# Patient Record
Sex: Male | Born: 1945 | Race: White | Hispanic: No | Marital: Married | Smoking: Former smoker
Health system: Southern US, Community
[De-identification: ages and names within clinical notes are randomized; demographics above are authoritative.]

## PROBLEM LIST (undated history)

## (undated) DIAGNOSIS — E7800 Pure hypercholesterolemia, unspecified: Secondary | ICD-10-CM

## (undated) DIAGNOSIS — I739 Peripheral vascular disease, unspecified: Secondary | ICD-10-CM

## (undated) DIAGNOSIS — C801 Malignant (primary) neoplasm, unspecified: Secondary | ICD-10-CM

## (undated) DIAGNOSIS — K630 Abscess of intestine: Secondary | ICD-10-CM

## (undated) DIAGNOSIS — K219 Gastro-esophageal reflux disease without esophagitis: Secondary | ICD-10-CM

## (undated) DIAGNOSIS — I10 Essential (primary) hypertension: Secondary | ICD-10-CM

## (undated) DIAGNOSIS — E119 Type 2 diabetes mellitus without complications: Secondary | ICD-10-CM

## (undated) DIAGNOSIS — K279 Peptic ulcer, site unspecified, unspecified as acute or chronic, without hemorrhage or perforation: Secondary | ICD-10-CM

## (undated) HISTORY — PX: CHOLECYSTECTOMY: SHX55

## (undated) HISTORY — PX: HERNIA REPAIR: SHX51

## (undated) HISTORY — PX: TONSILLECTOMY: SUR1361

## (undated) HISTORY — PX: OTHER SURGICAL HISTORY: SHX169

## (undated) HISTORY — PX: RETINAL DETACHMENT SURGERY: SHX105

---

## 2012-05-02 ENCOUNTER — Ambulatory Visit: Payer: Self-pay | Admitting: Vascular Surgery

## 2012-05-02 LAB — BASIC METABOLIC PANEL
Anion Gap: 6 — ABNORMAL LOW (ref 7–16)
BUN: 12 mg/dL (ref 7–18)
Calcium, Total: 9.8 mg/dL (ref 8.5–10.1)
Chloride: 107 mmol/L (ref 98–107)
Co2: 29 mmol/L (ref 21–32)
Creatinine: 0.88 mg/dL (ref 0.60–1.30)
EGFR (African American): >60
EGFR (Non-African Amer.): >60
Glucose: 137 mg/dL — ABNORMAL HIGH (ref 65–99)
Osmolality: 285 (ref 275–301)
Potassium: 3.9 mmol/L (ref 3.5–5.1)
Sodium: 142 mmol/L (ref 136–145)

## 2012-05-02 IMAGING — XA IR VASCULAR PROCEDURE
13 of 16 series · 15 of 24 positions shown · IV contrast (IODINE)
Comparison: none

[Series 1: care aorta · 1 of 2 slices shown (1 of 2)]
[im 1/2]
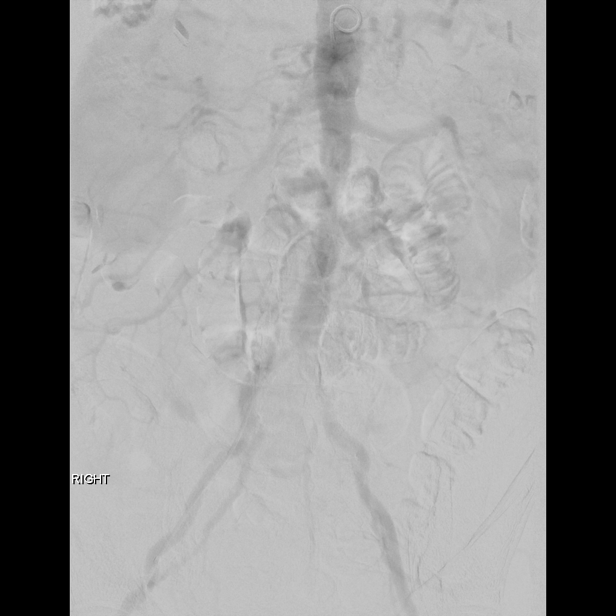

[Series 2: care aorta · 1 of 4 slices shown (2 of 2)]
[im 4/4]
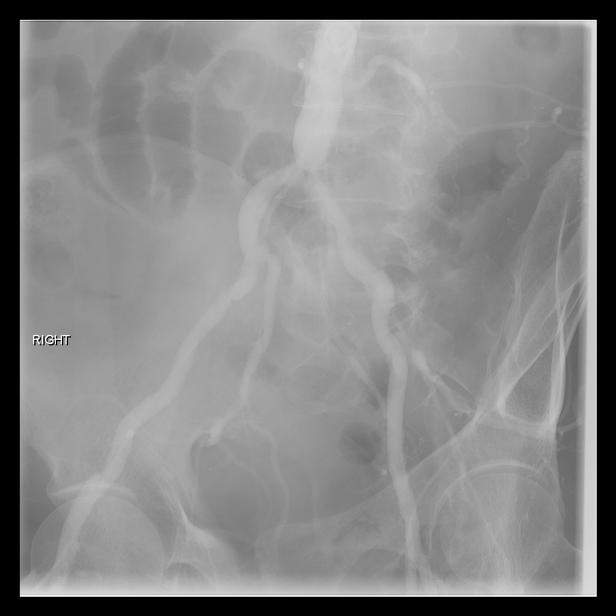

[Series 4: care sfa · 1 of 2 slices shown (1 of 2)]
[im 1/2]
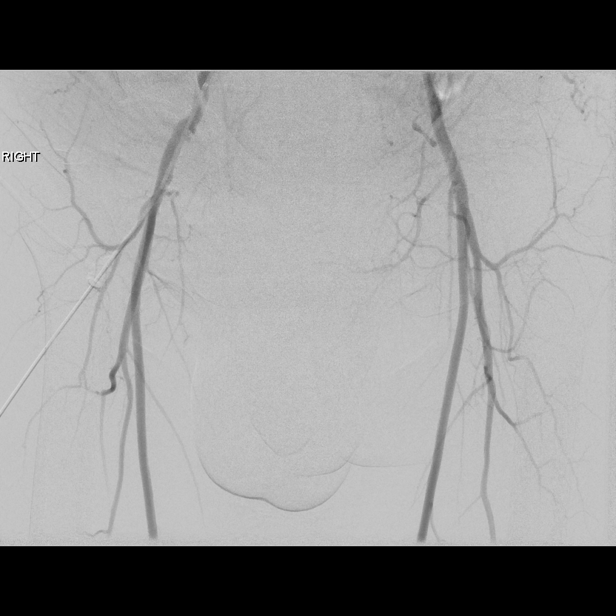

[Series 5: care sfa · 1 of 2 slices shown (2 of 2)]
[im 1/2]
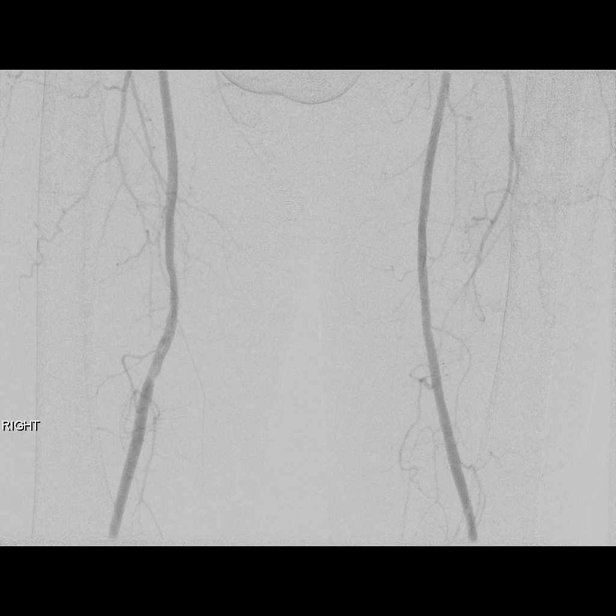

[Series 7: care iliacs · 1 of 2 slices shown (1 of 8)]
[im 1/2]
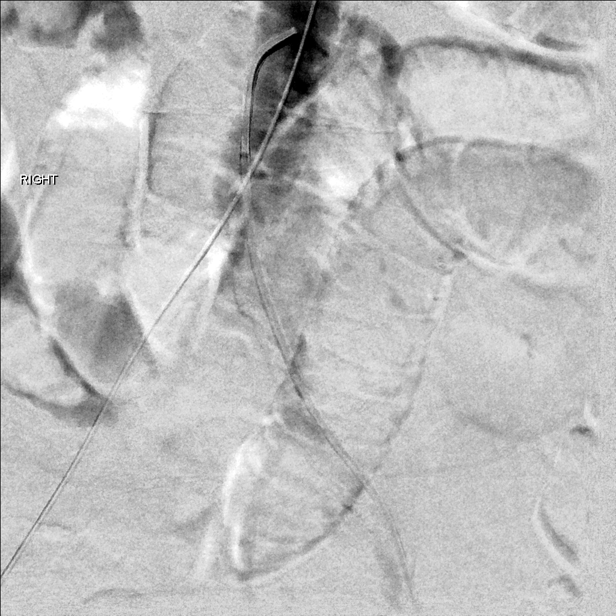

[Series 8: care iliacs · 1 of 2 slices shown (2 of 8)]
[im 1/2]
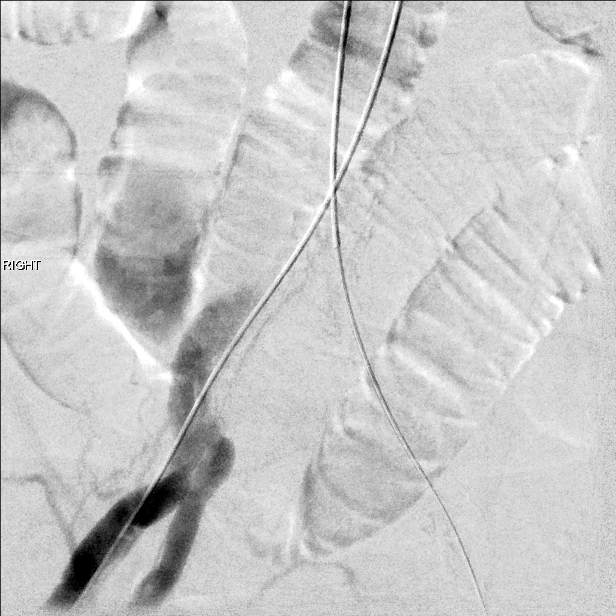

[Series 9: care iliacs · 1 of 2 slices shown (3 of 8)]
[im 1/2]
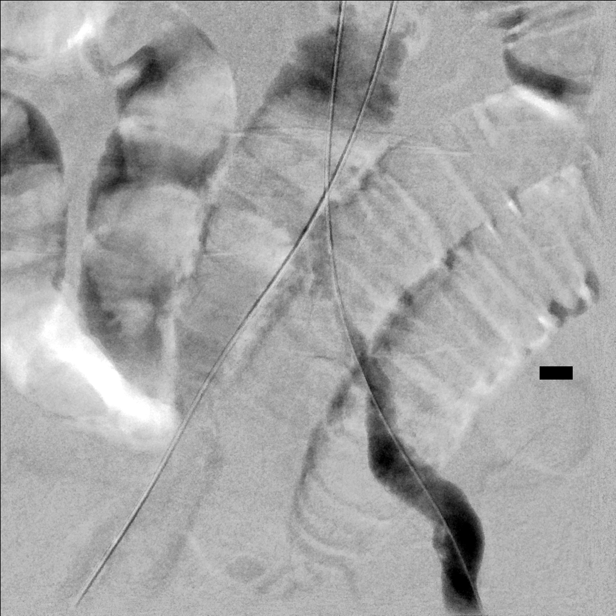

[Series 10: fl - angio · 1 of 1 slices shown]
[im 1/1]
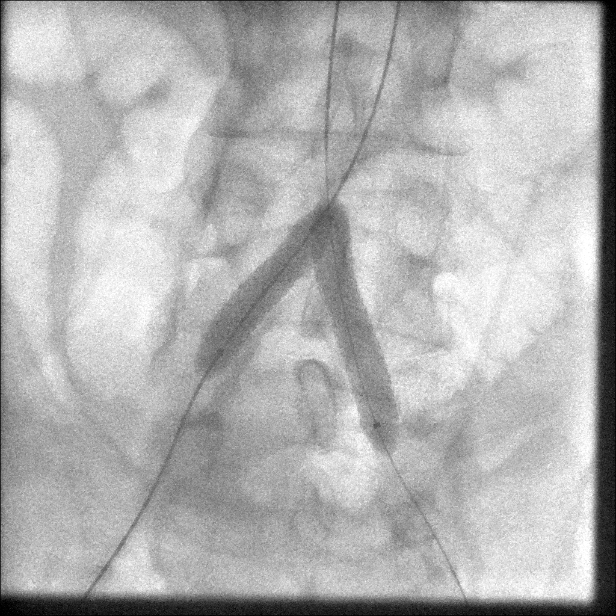

[Series 11: care iliacs · 1 of 2 slices shown (4 of 8)]
[im 1/2]
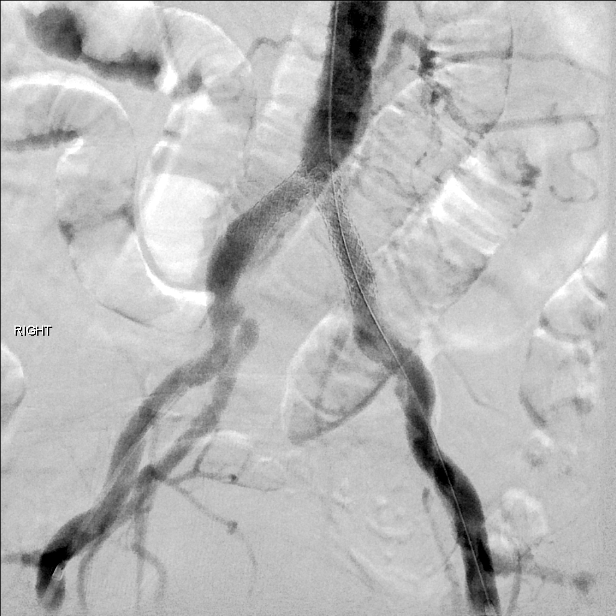

[Series 12: care iliacs · 2 of 2 slices shown (5 of 8)]
[im 1/2]
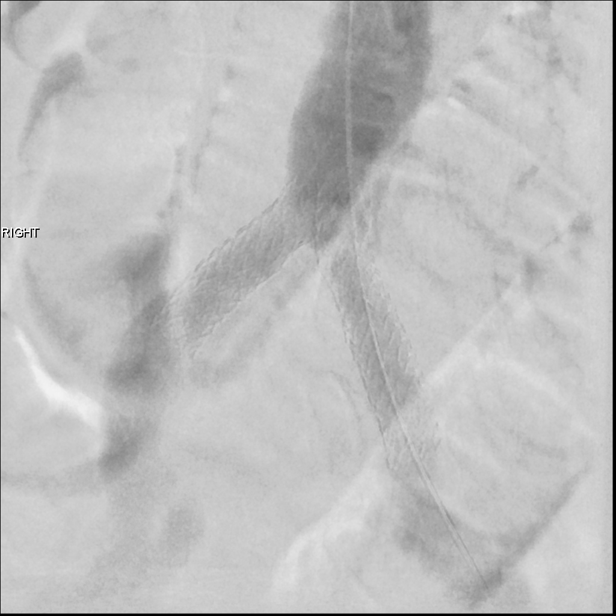
[im 2/2]
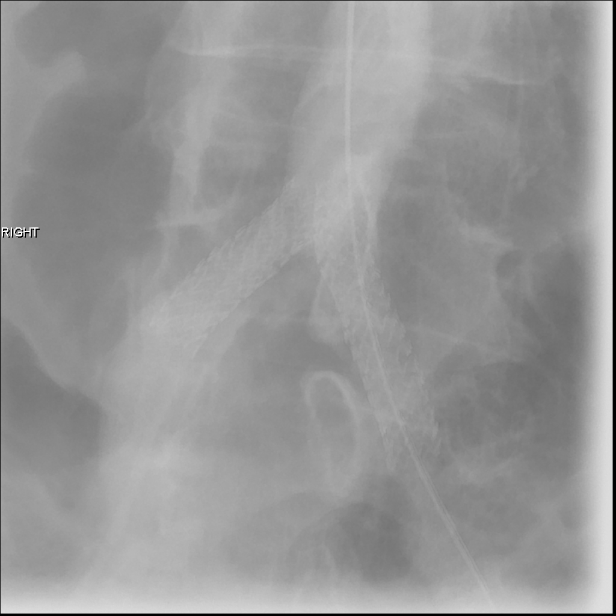

[Series 14: care iliacs · 1 of 2 slices shown (6 of 8)]
[im 1/2]
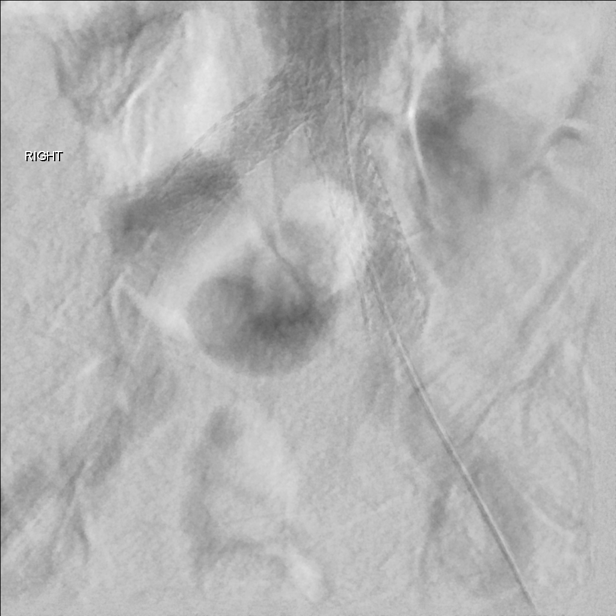

[Series 15: care iliacs · 2 of 2 slices shown (7 of 8)]
[im 1/2]
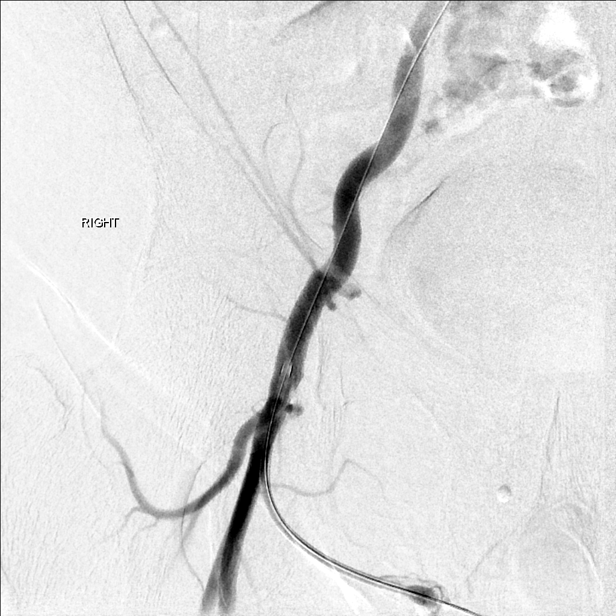
[im 2/2]
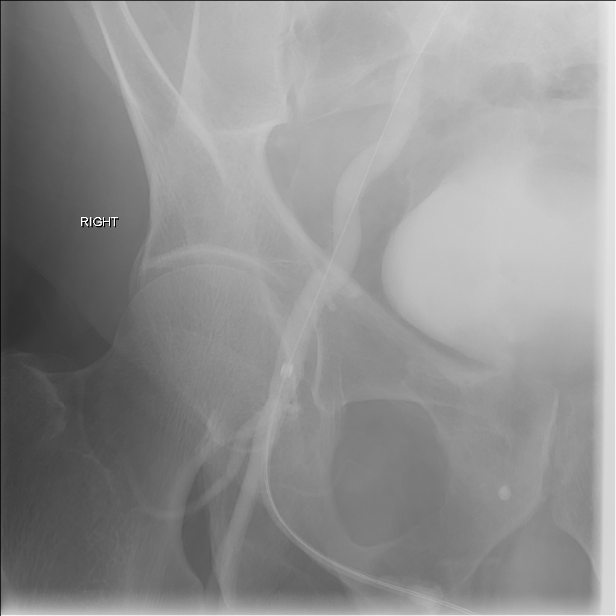

[Series 16: care iliacs · 1 of 2 slices shown (8 of 8)]
[im 2/2]
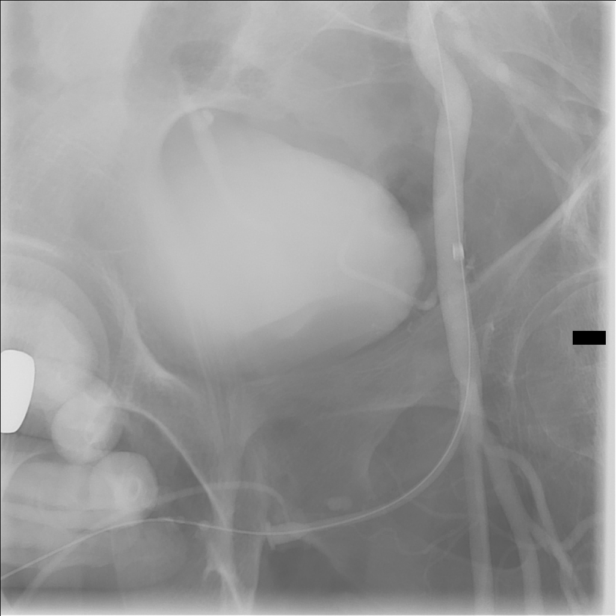

[15 of 24 positions shown; findings below may reference images not displayed]

IMAGES IMPORTED FROM THE [employer] WORKFLOW SYSTEM
NO DICTATION FOR STUDY

## 2014-04-22 ENCOUNTER — Ambulatory Visit: Payer: Self-pay | Admitting: Family Medicine

## 2014-05-02 ENCOUNTER — Ambulatory Visit: Payer: Self-pay | Admitting: Family Medicine

## 2014-06-02 ENCOUNTER — Ambulatory Visit: Payer: Self-pay | Admitting: Family Medicine

## 2014-11-19 NOTE — Op Note (Signed)
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   PROCEDURES PERFORMED:  1. Abdominal aortogram.  2. Bilateral lower extremity distal runoff.  3. Introduction catheter into aorta, left groin.  4. Introduction catheter into aorta, right groin.  5. Percutaneous transluminal angioplasty, left common iliac to 8 mm.  6. Percutaneous transluminal angioplasty, right common iliac to 8 mm.   SURGEON: Hortencia Pilar, MD  SEDATION: Versed 5 mg plus fentanyl 150 mcg administered IV. Continuous ECG, pulse oximetry and cardiopulmonary monitoring was performed throughout the entire procedure by the interventional radiology nurse. Total sedation time was one hour.   ACCESSES:  1. 6 French sheath, right common femoral artery. 2. 6 French sheath, left common femoral artery.   CONTRAST USED: Isovue 105 mL.   FLUOROSCOPY TIME: 4.6 minutes.   INDICATIONS: [name] is a 69 year old gentleman who was having increasing pain in his lower extremities as well as profoundly short distance claudication which is having severe consequences for his daily activities. The risks and benefits for angiography and intervention were reviewed, all questions are answered, and the patient agrees to proceed.   DESCRIPTION OF PROCEDURE: The patient is taken to special procedures and placed in the supine position. After adequate sedation is achieved, both groins are prepped and draped in sterile fashion. Ultrasound is placed in a sterile sleeve. Ultrasound is utilized secondary to lack of appropriate landmarks and to avoid vascular injury. Common femoral artery is identified, bifurcation is noted, and  the artery is followed more proximally. Under direct ultrasound visualization, after 1% lidocaine has been infiltrated in the soft tissues, the micropuncture needle is inserted into the anterior wall of the common femoral artery, microwire followed by microsheath, J-wire followed by 5 French sheath and 5 French pigtail catheter. Pigtail catheter is advanced into the abdominal aorta and positioned at approximately the T12 level. AP projection of the aorta is obtained, bilateral oblique views of the pelvis are obtained, and subsequently AP projection of the femoral arteries down to the popliteal. After review of the images, 4000 units of heparin was given and the ultrasound was once again utilized. It is in a sterile sleeve. The left common femoral artery is identified, femoral bifurcation is noted, and then the artery is scanned more proximally. 1% lidocaine is infiltrated in the soft tissues and access to the common femoral artery is obtained with a micropuncture needle, microwire followed by microsheath, J-wire followed by 6 French sheath. The 5 French sheath on the right is exchanged for a 6 French sheath over a Magic torque wire, introduced through the pigtail catheter. Using the Magic torque wire and a KMP catheter, the iliac stenosis is negotiated, KMP is advanced into the aorta, hand injection of contrast is utilized to demonstrate intraluminal positioning, and the Magic torque wire reintroduced.   After measuring the oblique image, an 8 x 29 Herculink stent is selected for the right and an 8 x 39 Herculink stent is selected for the left. They are then advanced and brought up into the distal aorta and deployed simultaneously to 11 atmospheres. Follow-up angiography demonstrates excellent positioning of the stents with complete resolution of the stenosis. A small area of irregularity distal to the left stent is ballooned with the 8  x 39 Herculink balloon. Followup oblique view after this maneuver again  confirms excellent stent position and wide patency bilaterally with rapid flow of contrast now from the aorta into the iliac systems bilaterally. Excellent femoral pulses are now noted bilaterally.   Oblique views of the right and left groin are obtained and a StarClose device is deployed without difficulty. There are no immediate complications.   INTERPRETATION: The abdominal aorta is opacified with a bolus injection of contrast. It demonstrates diffuse disease but there are no hemodynamically significant stenoses. At the origins of the common iliacs bilaterally, there are greater than 90% stenoses noted. On the right is a very short segment, perhaps 10 mm in length, and on the left it appears to be a much longer segment involved, approximately 30 to 35 mm. External iliac arteries appear widely patent bilaterally as do the common femorals.   The right common femoral, profunda femoris, and superficial femoral artery as well as the popliteal in its visualized segments all are widely patent with non-hemodynamically significant disease noted, particularly at Hunter's canal, with similar findings on the left.   Following angioplasty and stent placement, the distal aorta and common iliac arteries are now widely patent with complete resolution of the stenosis.   SUMMARY: Successful recanalization of bilateral iliac arteries, as described above. ____________________________ Katha Cabal, MD ggs:slb D: 05/02/2012 10:52:15 ET T: 05/02/2012 11:11:38 ET JOB#: [id number]  cc: Katha Cabal, MD, <Dictator> Irven Easterly. Kary Kos, [place] MD ELECTRONICALLY SIGNED 05/03/2012 13:26

## 2015-01-28 ENCOUNTER — Encounter: Payer: Self-pay | Admitting: Dietician

## 2016-07-10 ENCOUNTER — Emergency Department
Admission: EM | Admit: 2016-07-10 | Discharge: 2016-07-10 | Disposition: A | Discharge status: Home / Self Care | Payer: Medicare Other | Attending: Emergency Medicine | Admitting: Emergency Medicine

## 2016-07-10 ENCOUNTER — Emergency Department: Payer: Medicare Other

## 2016-07-10 ENCOUNTER — Encounter: Payer: Self-pay | Admitting: Emergency Medicine

## 2016-07-10 DIAGNOSIS — E119 Type 2 diabetes mellitus without complications: Secondary | ICD-10-CM | POA: Insufficient documentation

## 2016-07-10 DIAGNOSIS — I10 Essential (primary) hypertension: Secondary | ICD-10-CM | POA: Diagnosis not present

## 2016-07-10 DIAGNOSIS — Z7984 Long term (current) use of oral hypoglycemic drugs: Secondary | ICD-10-CM | POA: Insufficient documentation

## 2016-07-10 DIAGNOSIS — Y929 Unspecified place or not applicable: Secondary | ICD-10-CM | POA: Insufficient documentation

## 2016-07-10 DIAGNOSIS — Y939 Activity, unspecified: Secondary | ICD-10-CM | POA: Insufficient documentation

## 2016-07-10 DIAGNOSIS — X58XXXA Exposure to other specified factors, initial encounter: Secondary | ICD-10-CM | POA: Insufficient documentation

## 2016-07-10 DIAGNOSIS — Z87891 Personal history of nicotine dependence: Secondary | ICD-10-CM | POA: Insufficient documentation

## 2016-07-10 DIAGNOSIS — Y999 Unspecified external cause status: Secondary | ICD-10-CM | POA: Insufficient documentation

## 2016-07-10 DIAGNOSIS — T184XXA Foreign body in colon, initial encounter: Secondary | ICD-10-CM | POA: Diagnosis not present

## 2016-07-10 DIAGNOSIS — K630 Abscess of intestine: Secondary | ICD-10-CM | POA: Diagnosis not present

## 2016-07-10 DIAGNOSIS — Z79899 Other long term (current) drug therapy: Secondary | ICD-10-CM | POA: Insufficient documentation

## 2016-07-10 DIAGNOSIS — R109 Unspecified abdominal pain: Secondary | ICD-10-CM | POA: Diagnosis present

## 2016-07-10 HISTORY — DX: Type 2 diabetes mellitus without complications: E11.9

## 2016-07-10 HISTORY — DX: Essential (primary) hypertension: I10

## 2016-07-10 LAB — CBC WITH DIFFERENTIAL/PLATELET
Basophils Absolute: 0.1 10*3/uL (ref 0–0.1)
Basophils Relative: 1 %
Eosinophils Absolute: 0.4 10*3/uL (ref 0–0.7)
Eosinophils Relative: 4 %
HCT: 43.4 % (ref 40.0–52.0)
Hemoglobin: 14.6 g/dL (ref 13.0–18.0)
Lymphocytes Relative: 18 %
Lymphs Abs: 1.8 10*3/uL (ref 1.0–3.6)
MCH: 31.4 pg (ref 26.0–34.0)
MCHC: 33.5 g/dL (ref 32.0–36.0)
MCV: 93.5 fL (ref 80.0–100.0)
Monocytes Absolute: 1 10*3/uL (ref 0.2–1.0)
Monocytes Relative: 10 %
Neutro Abs: 6.7 10*3/uL — ABNORMAL HIGH (ref 1.4–6.5)
Neutrophils Relative %: 67 %
Platelets: 258 10*3/uL (ref 150–440)
RBC: 4.64 MIL/uL (ref 4.40–5.90)
RDW: 13.4 % (ref 11.5–14.5)
WBC: 10.2 10*3/uL (ref 3.8–10.6)

## 2016-07-10 LAB — COMPREHENSIVE METABOLIC PANEL
ALT: 14 U/L — ABNORMAL LOW (ref 17–63)
AST: 18 U/L (ref 15–41)
Albumin: 3.8 g/dL (ref 3.5–5.0)
Alkaline Phosphatase: 47 U/L (ref 38–126)
Anion gap: 6 (ref 5–15)
BUN: 13 mg/dL (ref 6–20)
CO2: 26 mmol/L (ref 22–32)
Calcium: 10.1 mg/dL (ref 8.9–10.3)
Chloride: 106 mmol/L (ref 101–111)
Creatinine, Ser: 1.03 mg/dL (ref 0.61–1.24)
GFR calc Af Amer: >60 mL/min (ref >60)
GFR calc non Af Amer: >60 mL/min (ref >60)
Glucose, Bld: 142 mg/dL — ABNORMAL HIGH (ref 65–99)
Potassium: 3.7 mmol/L (ref 3.5–5.1)
Sodium: 138 mmol/L (ref 135–145)
Total Bilirubin: 0.7 mg/dL (ref 0.3–1.2)
Total Protein: 7.1 g/dL (ref 6.5–8.1)

## 2016-07-10 LAB — LIPASE, BLOOD: Lipase: 25 U/L (ref 11–51)

## 2016-07-10 IMAGING — DX DG ABDOMEN 2V
2 series · 2 of 2 positions shown · non-contrast
Comparison: None available

CLINICAL DATA: Lower abdominal pain and discomfort bloating.

EXAM:
ABDOMEN - 2 VIEW

[abdomen erect]
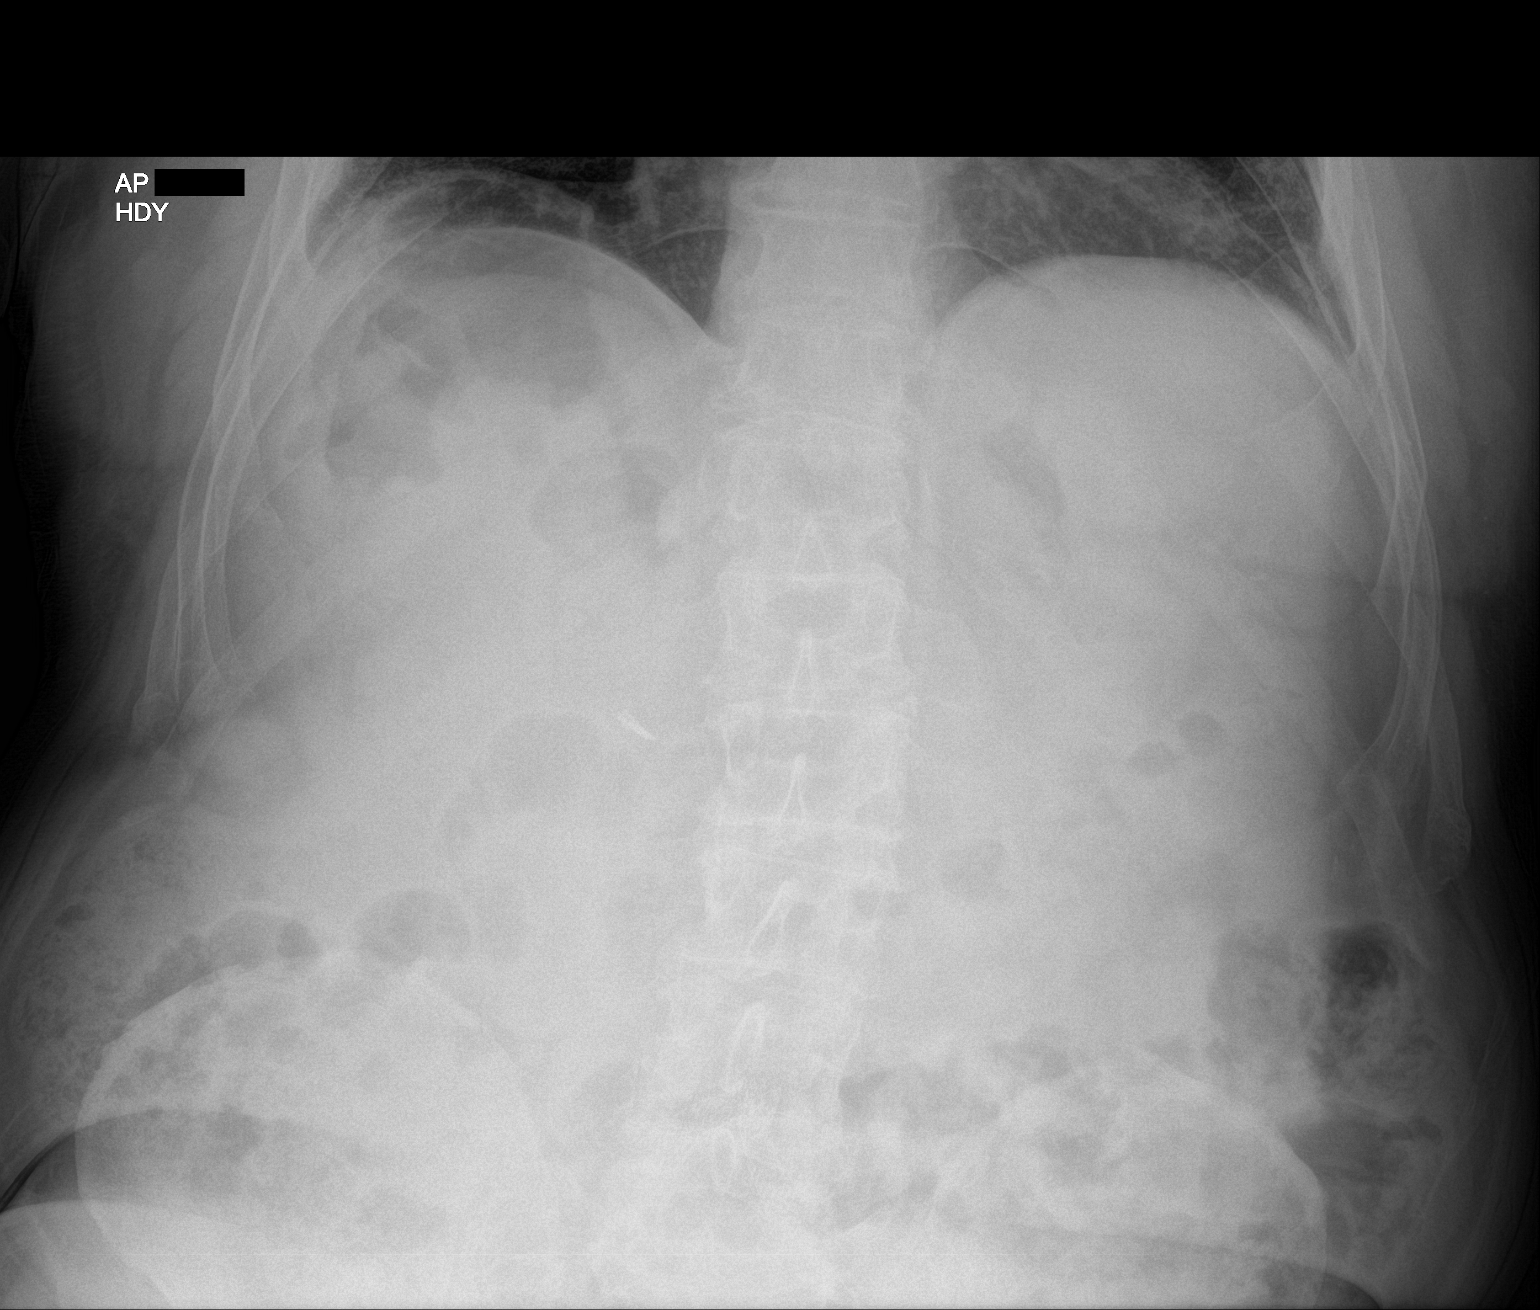

[abdomen supine]
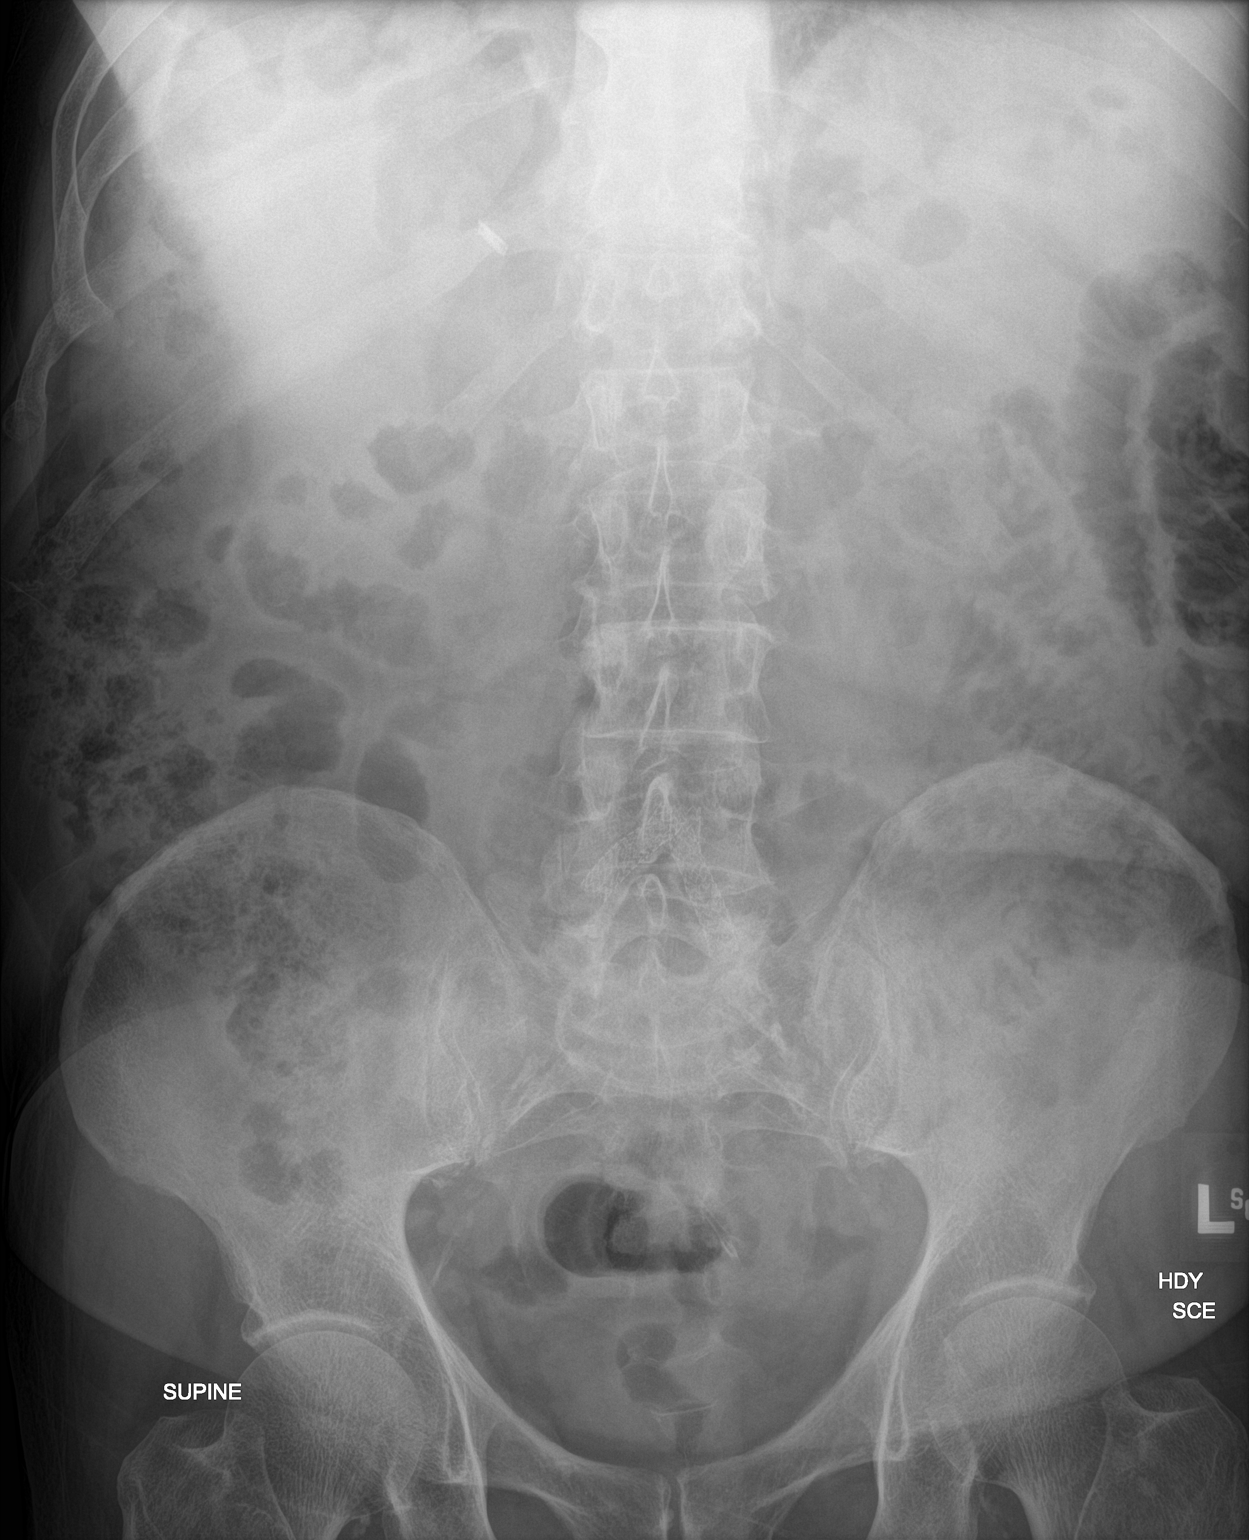

[2 of 2 positions shown; findings below may reference images not displayed]

FINDINGS: Scattered air throughout small and large bowel. Stool throughout the
colon. No significant obstruction pattern, ileus or dilatation.
Aortoiliac atherosclerosis noted with iliac bifurcation stents
present. Remote cholecystectomy.

Mild distention of the hepatic flexure of the colon beneath the
right hemidiaphragm. Also, lucency beneath the right hemidiaphragm
superimposed over the gas pattern noted, difficult to exclude free
air by plain radiography. Basilar atelectasis noted.
IMPRESSION: Negative for bowel obstruction, ileus or significant dilatation.

Lucency beneath the right hemidiaphragm with superimposed bowel gas,
difficult to exclude free air.

Remote postoperative changes as above

These results were called by telephone at the time of interpretation
on [DATE] at [DATE] to Dr. MV , who verbally
acknowledged these results.

## 2016-07-10 IMAGING — CT CT ABD-PELV W/ CM
2 of 5 series · 15 of 46 positions shown, 17 images · IV contrast (APPLIED)
Comparison: None.

CLINICAL DATA: Constipation with abdominal pain for 4 days.

EXAM:
CT ABDOMEN AND PELVIS WITH CONTRAST
TECHNIQUE: Multidetector CT imaging of the abdomen and pelvis was performed
using the standard protocol following bolus administration of
intravenous contrast.
CONTRAST:  100mL [SB] IOPAMIDOL ([SB]) INJECTION 61%

[Series 2: routine abd/pel with · axial · 0.84mm/px · z∈[-883,-478]mm · 12 of 93 slices shown, 14 images]
[im 6/93  soft-tissue]
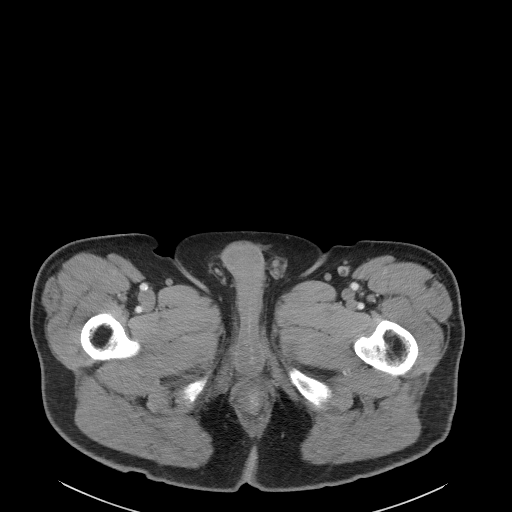
[im 6/93  bone]
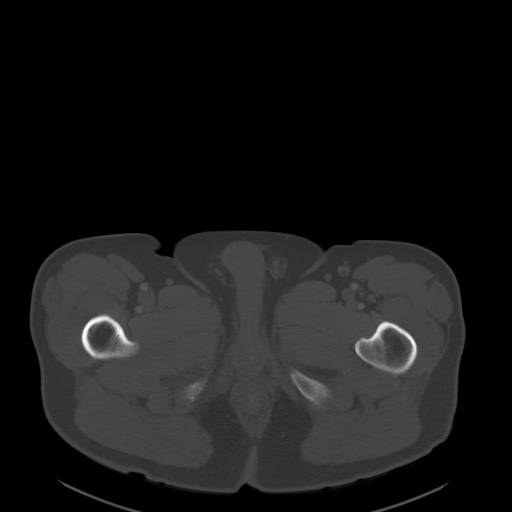
[im 16/93  soft-tissue]
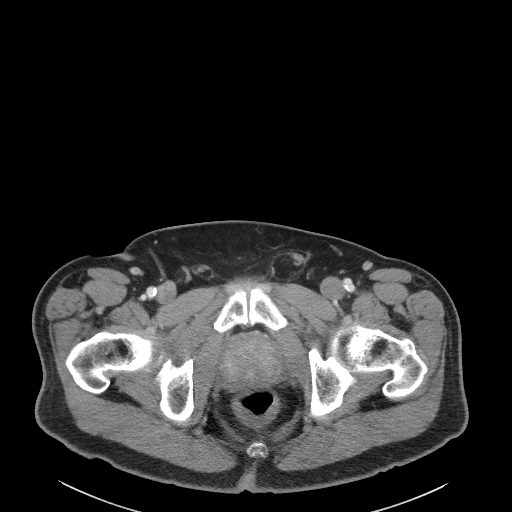
[im 21/93  soft-tissue]
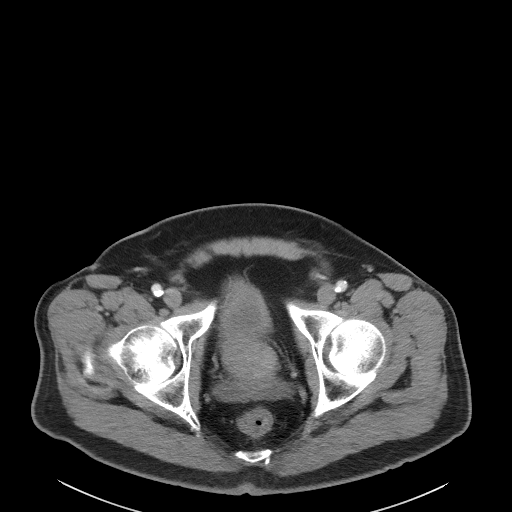
[im 26/93  soft-tissue]
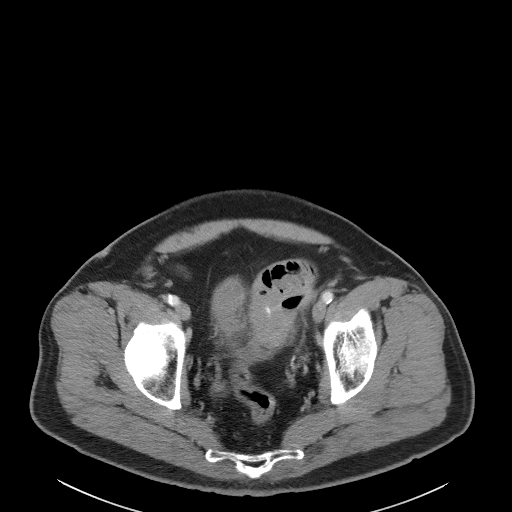
[im 36/93  soft-tissue]
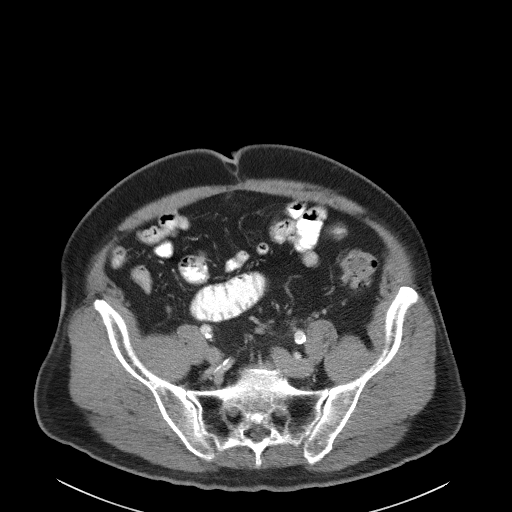
[im 41/93  soft-tissue]
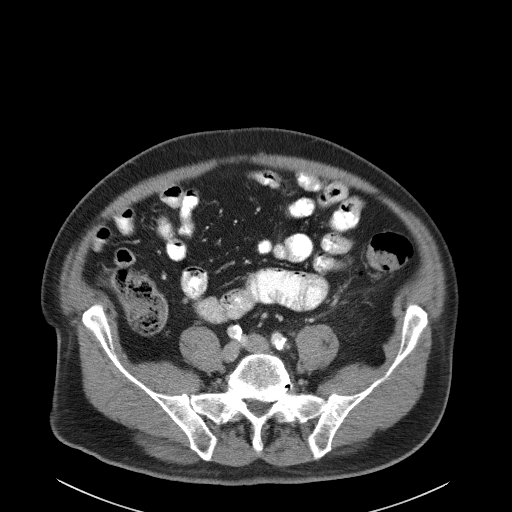
[im 52/93  soft-tissue]
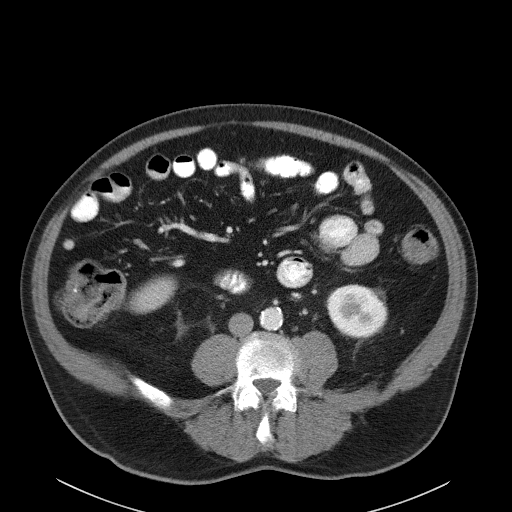
[im 57/93  soft-tissue]
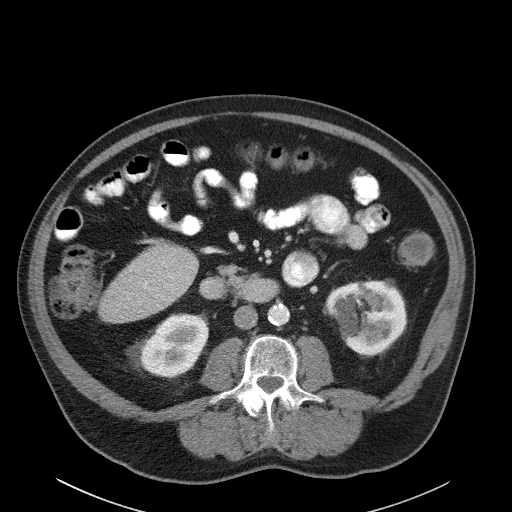
[im 67/93  soft-tissue]
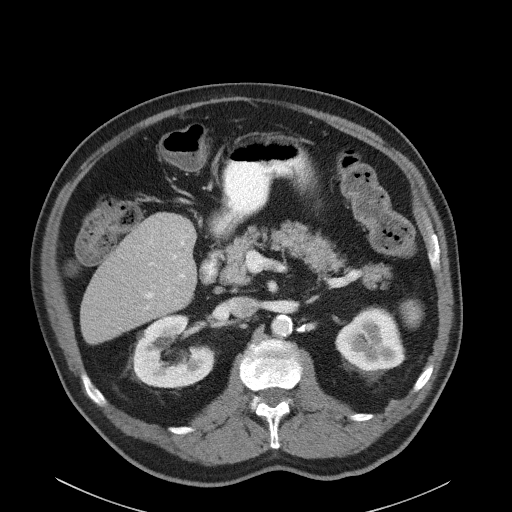
[im 67/93  bone]
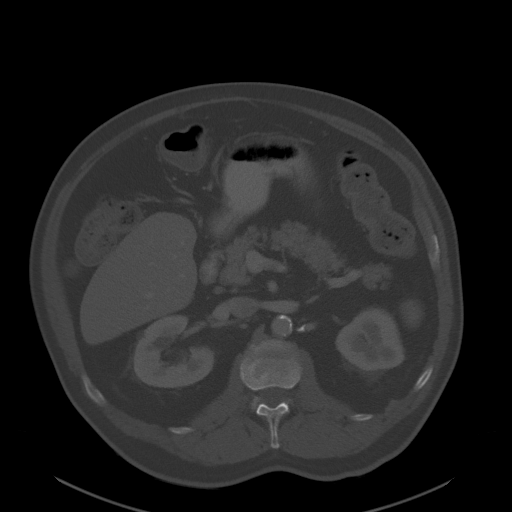
[im 72/93  soft-tissue]
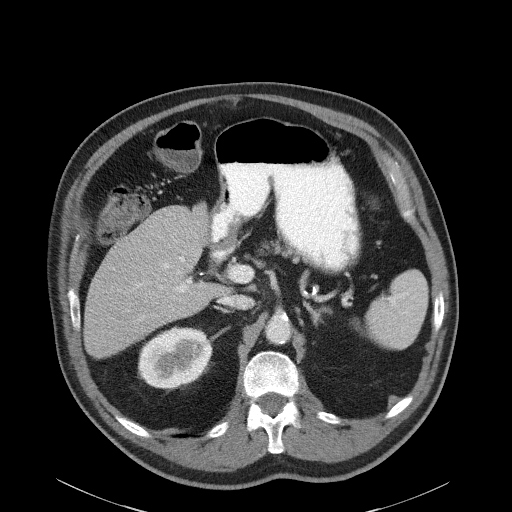
[im 77/93  soft-tissue]
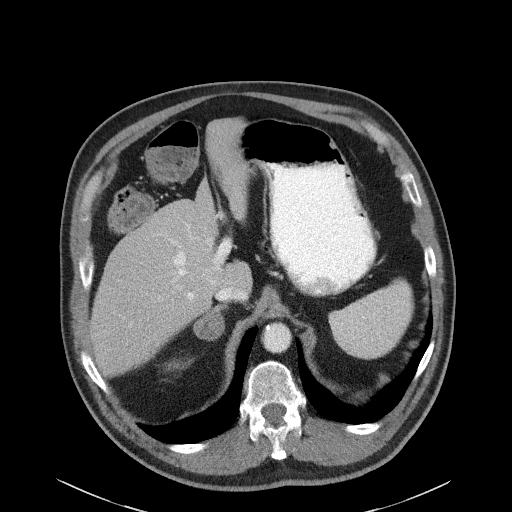
[im 87/93  soft-tissue]
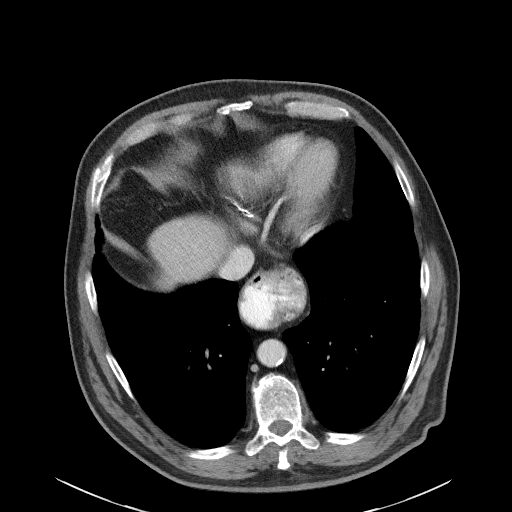

[Series 5: coronal st · coronal · 0.75mm/px · 3 of 114 slices shown]
[im 38/114  soft-tissue]
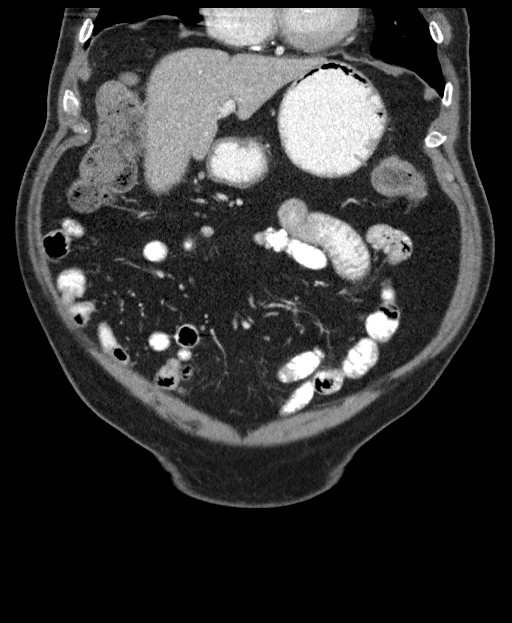
[im 51/114  soft-tissue]
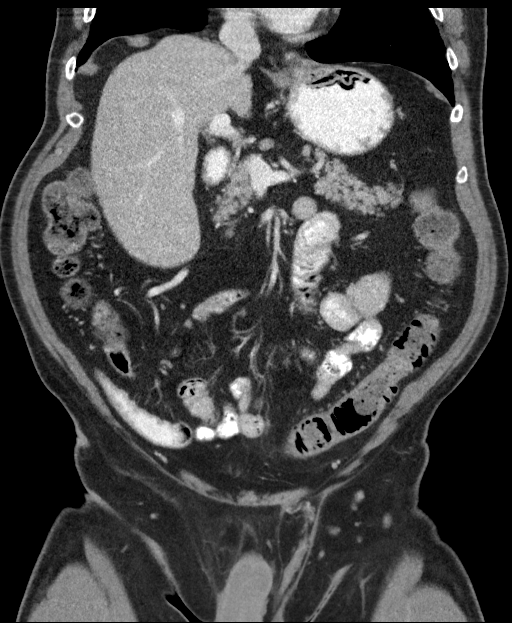
[im 63/114  soft-tissue]
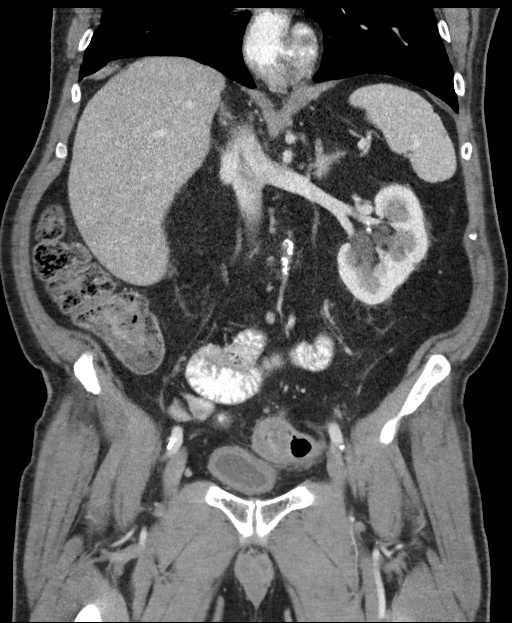

[15 of 46 positions shown; findings below may reference images not displayed]

FINDINGS: Lower chest: A moderate hiatal hernia is identified. No other acute
abnormalities seen in the lung bases.

Hepatobiliary: The patient is status post cholecystectomy. The
portal vein and liver are otherwise normal.

Pancreas: Unremarkable. No pancreatic ductal dilatation or
surrounding inflammatory changes.

Spleen: Normal in size without focal abnormality.

Adrenals/Urinary Tract: There is a mass in the right adrenal gland
which contains fat consistent with a myelolipoma or adenoma
measuring 2.9 cm. The left adrenal gland is normal. Bilateral renal
cysts are identified. A parapelvic cyst is seen on the left. No
acute perinephric stranding or hydronephrosis. No ureterectasis or
ureteral stones. Two small enhancing nodules are seen in the thick
walled bladder. The largest is seen laterally on coronal image 73
measuring 6 mm and the smaller 3 or 4 mm nodule is seen on coronal
image 79.

Stomach/Bowel: There is a moderate hiatal hernia. Remainder of the
stomach is normal. The small bowel is normal. There is a linear
radiodense object in the sigmoid colon best seen on axial image 67.
There is no extraluminal gas to suggest perforation. However, there
is a crescent of low attenuation in the wall of the sigmoid colon
best seen on series 5, image 70 measuring 17 by 5 mm, likely a small
abscess within the wall of the sigmoid colon. The wall is also
markedly thickened circumferentially. There is very mild adjacent
fat stranding. While there are diverticuli in this region, the
findings are thought to be secondary to the foreign body in the
sigmoid colon rather than primary diverticulitis. Other colonic
diverticuli are identified without other sites of inflammation. The
appendix is unremarkable within visualize limits.

Vascular/Lymphatic: Atherosclerosis is seen in the non aneurysmal
aorta, iliac vessels common femoral vessels, and branching vessels
off the aorta. No adenopathy.

Reproductive: Prostate calcifications and mild enlargement of
prostate. No other abnormalities.

Other: No free air or free fluid. A fat containing umbilical hernia
is identified.

Musculoskeletal: No acute or significant osseous findings.
IMPRESSION: 1. There is a linear or hooked radiopaque foreign body in the
sigmoid colon resulting in circumferential wall thickening and an
apparent small abscess in the wall of the sigmoid colon with mild
adjacent fat stranding. No free air or extraluminal fluid to suggest
a perforation at this time. The apparent abscess in the wall of the
sigmoid increases the patient's risk of perforation however.
2. Two enhancing nodules within the bladder concerning for
neoplasm/malignancy.
3. Atherosclerosis in the abdominal aorta and branching vessels.
4. Fat containing mass in the right adrenal gland consistent with a
myelolipoma or adenoma.

## 2016-07-10 MED ORDER — IOPAMIDOL (ISOVUE-300) INJECTION 61%
30.0000 mL | Freq: Once | INTRAVENOUS | Status: AC | PRN
  Administered 2016-07-10: 30 mL via ORAL

## 2016-07-10 MED ORDER — IOPAMIDOL (ISOVUE-300) INJECTION 61%
100.0000 mL | Freq: Once | INTRAVENOUS | Status: AC | PRN
  Administered 2016-07-10: 100 mL via INTRAVENOUS

## 2016-07-10 MED ORDER — METRONIDAZOLE 500 MG PO TABS: 500.0000 mg | ORAL_TABLET | Freq: Three times a day (TID) | ORAL | 0 refills | Status: DC

## 2016-07-10 MED ORDER — CIPROFLOXACIN HCL 500 MG PO TABS: 500.0000 mg | ORAL_TABLET | Freq: Two times a day (BID) | ORAL | 0 refills | Status: DC

## 2016-07-10 NOTE — ED Notes (Signed)
Pt states no BM since Wednesday, states no hx of constipation, denies any nausea or vomiting, states passing gas, states abd pain when he feels the urge to have a BM, denies any diet or medicine changes

## 2016-07-10 NOTE — ED Notes (Signed)
Patient transported to CT 

## 2016-07-10 NOTE — ED Triage Notes (Signed)
Abdominal pain, unable to have bowel movement x 3 days.

## 2016-07-10 NOTE — ED Notes (Signed)
Pt returned from CT, resting in bed, family at bedside 

## 2016-07-10 NOTE — ED Notes (Signed)
Pt resting in bed, wife at bedside, Dr. Jacqualine Code updated pt on CT results

## 2016-07-10 NOTE — Progress Notes (Signed)
Patient ID: Alejandro Randolph, male   DOB: 07-06-1946, 70 y.o.   MRN: [id number]  HPI Alejandro Randolph is a 70 y.o. male is in consultation for foreign body in the sigmoid colon. Of note he is 53 and came in for to the for constipation. Up on questioning he denies any abdominal pain the only thing he can recall is some discomfort when he is having a bowel movement. No bleeding, no hematochezia or melena. No fevers or chills. He is passing gas. No nausea or vomiting. He specifically denies in certain swallowing any foreign bodies or anything similar to this. Further workup included a CT scan of the abdomen and pelvis that I have personally reviewed there is a linear foreign body in the sigmoid colon. There is an oh evidence of perforation no evidence of free air there was some mild inflammation likely reactive from the foreign body. There are also 2 small bladder lesions concerning for cancer and also a right adrenal mass.  HPI  Past Medical History:  Diagnosis Date  . Diabetes mellitus without complication ([place])   . Hypertension     Past Surgical History:  Procedure Laterality Date  . femoral stents    . HERNIA REPAIR    . RETINAL DETACHMENT SURGERY    . TONSILLECTOMY      No family history on file.  Social History Social History  Substance Use Topics  . Smoking status: Former Research scientist (life sciences)  . Smokeless tobacco: Not on file  . Alcohol use No    No Known Allergies  No current facility-administered medications for this encounter.    Current Outpatient Prescriptions  Medication Sig Dispense Refill  . amLODipine (NORVASC) 10 MG tablet Take 1 tablet by mouth daily.    Marland Kitchen aspirin EC 81 MG tablet Take 1 tablet by mouth daily.    Marland Kitchen lisinopril-hydrochlorothiazide (PRINZIDE,ZESTORETIC) 20-12.5 MG tablet Take 1 tablet by mouth daily.    . metFORMIN (GLUCOPHAGE-XR) 500 MG 24 hr tablet Take 2 tablets by mouth daily.    . Omega-3 Fatty Acids (OMEGA-3 FISH OIL) 500 MG CAPS Take 1 capsule by mouth daily.     . pantoprazole (PROTONIX) 40 MG tablet Take 1 tablet by mouth daily.    . rosuvastatin (CRESTOR) 5 MG tablet Take 1 tablet by mouth daily.    . ciprofloxacin (CIPRO) 500 MG tablet Take 1 tablet (500 mg total) by mouth 2 (two) times daily. 20 tablet 0  . metroNIDAZOLE (FLAGYL) 500 MG tablet Take 1 tablet (500 mg total) by mouth 3 (three) times daily. 30 tablet 0     Review of Systems A 10 point review of systems was asked and was negative except for the information on the HPI  Physical Exam Blood pressure 135/64, pulse (!) 57, temperature 98 F (36.7 C), temperature source Oral, resp. rate 20, height '5\' 9"'$  (1.753 m), weight 90.7 kg (200 lb), SpO2 96 %. CONSTITUTIONAL: NAD awake alert EYES: Pupils are equal, round, and reactive to light, Sclera are non-icteric. EARS, NOSE, MOUTH AND THROAT: The oropharynx is clear. The oral mucosa is pink and moist. Hearing is intact to voice. LYMPH NODES:  Lymph nodes in the neck are normal. RESPIRATORY:  Lungs are clear. There is normal respiratory effort, with equal breath sounds bilaterally, and without pathologic use of accessory muscles. CARDIOVASCULAR: Heart is regular without murmurs, gallops, or rubs. GI: The abdomen is  soft, nontender, and nondistended. There are no palpable masses. There is no hepatosplenomegaly. No peritonitis GU: Rectal deferred.  MUSCULOSKELETAL: Normal muscle strength and tone. No cyanosis or edema.   SKIN: Turgor is good and there are no pathologic skin lesions or ulcers. NEUROLOGIC: Motor and sensation is grossly normal. Cranial nerves are grossly intact. PSYCH:  Oriented to person, place and time. Affect is normal.  Data Reviewed  I have personally reviewed the patient's imaging, laboratory findings and medical records.    Assessment Plan Foreign body in the sigmoid all knowing the chronicity of this lesion. At this point there is no evidence of complication related to foreign body. There is no evidence of  perforation and clinically he is doing fine with no abdominal pain and his abdomen is benign. I gave the patient 2 options of observation and potential and colonoscopy with foreign body removal as well as an inpatient versus further workup and colonoscopy removal in the outpatient setting. I will see him next week in my office and we will coordinate with either Dr. Allen Norris or Dr. Vicente Males for a potential colonoscopic foreign body removal. If the gastroenterologist pain is that this is a high risk to remove endoscopically we can definitely attempt to do a combined procedure with the laparoscope. At this time there is no need for any immediate surgical revision at this time. I did I gave him the opportunity to stay in the hospital but he wishes to have this done in the outpatient setting. Extensive counseling provided.  Caroleen Hamman, MD FACS General Surgeon 07/10/2016, 4:17 PM

## 2016-07-10 NOTE — ED Provider Notes (Signed)
[facility] Emergency Department Provider Note  ____________________________________________   First MD Initiated Contact with Patient 07/10/16 [phone]     (approximate)  I have reviewed the triage vital signs and the nursing notes.   HISTORY  Chief Complaint Abdominal Pain  HPI Alejandro Randolph is a 70 y.o. male history of diabetes and elevated blood pressure.  Patient reports the last 3 days that he's been experiencing a feeling of constipation, yesterday's abdomen felt swollen and somewhat "hard" but has improved somewhat. He's been passing small amounts of gas, continues to have an appetite, and reports no pain at present but he will have episodes of pain throughout the abdomen whenever he's been trying to have a bowel movement. Denies pain in or around the rectum.  No fevers or chills. No previous abdominal surgery other than groin/hernia repair where he has not had any pain or swelling.   Past Medical History:  Diagnosis Date  . Diabetes mellitus without complication ([place])   . Hypertension     There are no active problems to display for this patient.   Past Surgical History:  Procedure Laterality Date  . femoral stents    . HERNIA REPAIR    . RETINAL DETACHMENT SURGERY    . TONSILLECTOMY      Prior to Admission medications   Medication Sig Start Date End Date Taking? Authorizing Provider  amLODipine (NORVASC) 10 MG tablet Take 1 tablet by mouth daily. 06/23/16  Yes Historical Provider, MD  aspirin EC 81 MG tablet Take 1 tablet by mouth daily.   Yes Historical Provider, MD  lisinopril-hydrochlorothiazide (PRINZIDE,ZESTORETIC) 20-12.5 MG tablet Take 1 tablet by mouth daily. 05/25/16  Yes Historical Provider, MD  metFORMIN (GLUCOPHAGE-XR) 500 MG 24 hr tablet Take 2 tablets by mouth daily. 06/05/16  Yes Historical Provider, MD  Omega-3 Fatty Acids (OMEGA-3 FISH OIL) 500 MG CAPS Take 1 capsule by mouth daily.   Yes Historical Provider, MD    pantoprazole (PROTONIX) 40 MG tablet Take 1 tablet by mouth daily. 05/25/16  Yes Historical Provider, MD  rosuvastatin (CRESTOR) 5 MG tablet Take 1 tablet by mouth daily. 05/10/16  Yes Historical Provider, MD  ciprofloxacin (CIPRO) 500 MG tablet Take 1 tablet (500 mg total) by mouth 2 (two) times daily. 07/10/16   Delman Kitten, MD  metroNIDAZOLE (FLAGYL) 500 MG tablet Take 1 tablet (500 mg total) by mouth 3 (three) times daily. 07/10/16   Delman Kitten, MD    Allergies Patient has no known allergies.  No family history on file.  Social History Social History  Substance Use Topics  . Smoking status: Former Research scientist (life sciences)  . Smokeless tobacco: Not on file  . Alcohol use No    Review of Systems Constitutional: No fever/chills Eyes: No visual changes. ENT: No sore throat. Cardiovascular: Denies chest pain. Respiratory: Denies shortness of breath. Gastrointestinal: See history of present illness Genitourinary: Negative for dysuria. Musculoskeletal: Negative for back pain. Skin: Negative for rash. Neurological: Negative for headaches, focal weakness or numbness.  Patient reports to stents in the aorta, placed a few years ago for narrowing  10-point ROS otherwise negative.  ____________________________________________   PHYSICAL EXAM:  VITAL SIGNS: ED Triage Vitals  Enc Vitals Group     BP 07/10/16 0911 (!) 151/68     Pulse Rate 07/10/16 0911 69     Resp 07/10/16 0911 18     Temp 07/10/16 0911 98 F (36.7 C)     Temp Source 07/10/16 0911 Oral  SpO2 07/10/16 0911 96 %     Weight 07/10/16 0913 200 lb (90.7 kg)     Height 07/10/16 0913 '5\' 9"'$  (1.753 m)     Head Circumference --      Peak Flow --      Pain Score 07/10/16 0913 1     Pain Loc --      Pain Edu? --      Excl. in [place]? --     Constitutional: Alert and oriented. Well appearing and in no acute distress. Eyes: Conjunctivae are normal. PERRL. EOMI. Head: Atraumatic. Nose: No congestion/rhinnorhea. Mouth/Throat: Mucous  membranes are moist.  Oropharynx non-erythematous. Neck: No stridor.   Cardiovascular: Normal rate, regular rhythm. Grossly normal heart sounds.  Good peripheral circulation. Respiratory: Normal respiratory effort.  No retractions. Lungs CTAB. Gastrointestinal: Soft and nontender. Soft, protuberant and slightly tympanitic. No abdominal bruits. No CVA tenderness. Rectal exam, vault empty with no evidence of impaction. Musculoskeletal: No lower extremity tenderness nor edema.  No joint effusions. Neurologic:  Normal speech and language. No gross focal neurologic deficits are appreciated. Strong pulses dorsalis pedis and posterior tibial bilateral. Skin:  Skin is warm, dry and intact. No rash noted. Psychiatric: Mood and affect are normal. Speech and behavior are normal.  ____________________________________________   LABS (all labs ordered are listed, but only abnormal results are displayed)  Labs Reviewed  CBC WITH DIFFERENTIAL/PLATELET - Abnormal; Notable for the following:       Result Value   Neutro Abs 6.7 (*)    All other components within normal limits  COMPREHENSIVE METABOLIC PANEL - Abnormal; Notable for the following:    Glucose, Bld 142 (*)    ALT 14 (*)    All other components within normal limits  LIPASE, BLOOD   ____________________________________________  EKG   ____________________________________________  RADIOLOGY  Ct Abdomen Pelvis W Contrast  Result Date: 07/10/2016 CLINICAL DATA:  Constipation with abdominal pain for 4 days. EXAM: CT ABDOMEN AND PELVIS WITH CONTRAST TECHNIQUE: Multidetector CT imaging of the abdomen and pelvis was performed using the standard protocol following bolus administration of intravenous contrast. CONTRAST:  151m ISOVUE-300 IOPAMIDOL (ISOVUE-300) INJECTION 61% COMPARISON:  None. FINDINGS: Lower chest: A moderate hiatal hernia is identified. No other acute abnormalities seen in the lung bases. Hepatobiliary: The patient is status post  cholecystectomy. The portal vein and liver are otherwise normal. Pancreas: Unremarkable. No pancreatic ductal dilatation or surrounding inflammatory changes. Spleen: Normal in size without focal abnormality. 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 The left adrenal gland is normal. Bilateral renal cysts are identified. A parapelvic cyst is seen on the left. No acute perinephric stranding or hydronephrosis. No ureterectasis or ureteral stones. Two small enhancing nodules are seen in the thick walled bladder. The largest is seen laterally on coronal image 73 measuring 6 mm and the smaller 3 or 4 mm nodule is seen on coronal image 79. Stomach/Bowel: There is a moderate hiatal hernia. Remainder of the stomach is normal. The small bowel is normal. There is a linear radiodense object in the sigmoid colon best seen on axial image 67. There is no extraluminal gas to suggest perforation. However, there is a crescent of low attenuation in the wall of the sigmoid colon best seen on series 5, image 70 measuring 17 by 5 mm, likely a small abscess within the wall of the sigmoid colon. The wall is also markedly thickened circumferentially.  There is very mild adjacent fat stranding. While there are diverticuli in this region, the findings are thought to be secondary to the foreign body in the sigmoid colon rather than primary diverticulitis. 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 The appendix is unremarkable within visualize limits. Vascular/Lymphatic: Atherosclerosis is seen in the non aneurysmal aorta, iliac vessels common femoral vessels, and branching vessels off the aorta. No adenopathy. Reproductive: Prostate calcifications and mild enlargement of prostate. No other abnormalities. Other: No free air or free fluid. A fat containing umbilical hernia is identified. Musculoskeletal: No  acute or significant osseous findings. IMPRESSION: 1. There is a linear or hooked radiopaque foreign body in the sigmoid colon resulting in circumferential wall thickening and an apparent small abscess in the wall of the sigmoid colon with mild adjacent fat stranding. No free air or extraluminal fluid to suggest a perforation at this time. The apparent abscess in the wall of the sigmoid increases the patient's risk of perforation however. 2. Two enhancing nodules within the bladder concerning for neoplasm/malignancy. 3. Atherosclerosis in the abdominal aorta and branching vessels. 4. 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 Electronically Signed   By: Dorise Bullion III M.D   On: 07/10/2016 11:50   Dg Abd 2 Views  Result Date: 07/10/2016 CLINICAL DATA:  Lower abdominal pain and discomfort bloating. EXAM: ABDOMEN - 2 VIEW COMPARISON:  None available FINDINGS: Scattered air throughout small and large bowel. Stool throughout the colon. No significant obstruction pattern, ileus or dilatation. Aortoiliac atherosclerosis noted with iliac bifurcation stents present. Remote cholecystectomy. Mild distention of the hepatic flexure of the colon beneath the right hemidiaphragm. Also, lucency beneath the right hemidiaphragm superimposed over the gas pattern noted, difficult to exclude free air by plain radiography. Basilar atelectasis noted. IMPRESSION: Negative for bowel obstruction, ileus or significant dilatation. Lucency beneath the right hemidiaphragm with superimposed bowel gas, difficult to exclude free air. Remote postoperative changes as above These results were called by telephone at the time of interpretation on 07/10/2016 at 10:28 am to Dr. Delman Kitten , who verbally acknowledged these results. Electronically Signed   By: Jerilynn Mages.  Shick M.D.   On: 07/10/2016 10:30    ____________________________________________   PROCEDURES  Procedure(s) performed:  None  Procedures  Critical Care performed: No  ____________________________________________   INITIAL IMPRESSION / ASSESSMENT AND PLAN / ED COURSE  Pertinent labs & imaging results that were available during my care of the patient were reviewed by me and considered in my medical decision making (see chart for details).  Differential diagnosis includes but is not limited to, abdominal perforation, aortic dissection, cholecystitis, appendicitis, diverticulitis, colitis, esophagitis/gastritis, kidney stone, pyelonephritis, urinary tract infection, aortic aneurysm. All are considered in decision and treatment plan. Based upon the patient's presentation and risk factors, plan to proceed with an ultrasound to assure normal aorta, and addition a x-ray to evaluate for constipation versus possible obstruction. The patient does not have any pain presently, but certainly has symptomatology of slow transit constipation or possible obstruction.  ----------------------------------------- 10:38 AM on 07/10/2016 -----------------------------------------  Radiology concerned about possible free air, versus artifactual. Recommending CT which I reviewed x-rays, and agree. Discussed with the patient and will proceed with CT scan to further evaluate and rule out intra-abdominal etiology/free air/etc. He is to be awake and alert, reports no ongoing pain or discomfort at this time.  Patient was seen by general surgery, Dr. Dahlia Byes recommends discharging with prescriptions for ciprofloxacin and  Flagyl. Patient provided these as well as close return cautions, as well as a plan to see Dr. Dahlia Byes in clinic in the coming week for follow-up and probable colonoscopy.   Clinical Course      ____________________________________________   FINAL CLINICAL IMPRESSION(S) / ED DIAGNOSES  Final diagnoses:  Abscess of sigmoid colon  Foreign body in colon, initial encounter      NEW MEDICATIONS STARTED DURING THIS  VISIT:  New Prescriptions   CIPROFLOXACIN (CIPRO) 500 MG TABLET    Take 1 tablet (500 mg total) by mouth 2 (two) times daily.   METRONIDAZOLE (FLAGYL) 500 MG TABLET    Take 1 tablet (500 mg total) by mouth 3 (three) times daily.     Note:  This document was prepared using Dragon voice recognition software and may include unintentional dictation errors.     Delman Kitten, MD 07/10/16 [phone]

## 2016-07-10 NOTE — Discharge Instructions (Signed)
We believe your symptoms are caused by an infection around suspected foreign body around the end (Sigmoid) of your colon. Please take the full course of prescribed medication(s) and follow up with the doctors recommended above.  Follow-up with Dr. Dahlia Byes this coming week in clinic.  Return to the ED if your abdominal pain worsens or fails to improve, you develop bloody vomiting, bloody diarrhea, you are unable to tolerate fluids due to vomiting, fever greater than 101, or other symptoms that concern you.

## 2016-07-10 NOTE — ED Notes (Signed)
Pt resting in bed, resp even and unlabored, wife at bedside, pt denies any needs

## 2016-07-10 NOTE — ED Notes (Signed)
EDP at bedside  

## 2016-07-12 ENCOUNTER — Telehealth: Payer: Self-pay | Admitting: Surgery

## 2016-07-12 ENCOUNTER — Telehealth: Payer: Self-pay

## 2016-07-12 ENCOUNTER — Other Ambulatory Visit: Payer: Self-pay | Admitting: Surgery

## 2016-07-12 DIAGNOSIS — K630 Abscess of intestine: Secondary | ICD-10-CM

## 2016-07-12 NOTE — Telephone Encounter (Signed)
Called patient back and informed him that we needed to schedule him an appointment to come in and see Dr. Dahlia Byes for a consult. I also told him that he needed to continue taking his antibiotics. Patient was given an appointment. Patient understood and had no further questions.

## 2016-07-12 NOTE — Telephone Encounter (Signed)
Mr. Brabender's wife, Katharine Look called saying he went to the ER on Saturday. He was told he has a foreign object at the bottom or his colon and that he should speak to Dr. Dahlia Byes to schedule a Colonoscopy. Please give Pranit a phone call regarding this.  Pt's ph# [phone] Thank you.

## 2016-07-12 NOTE — Telephone Encounter (Signed)
Called patient but had to leave a detailed message letting patient know that he needs to have an abdominal X-Ray done prior to his appointment. He could go to the [facility] around 1:00 PM to have this done and then come to our office to be seen.

## 2016-07-13 ENCOUNTER — Ambulatory Visit
Admission: RE | Admit: 2016-07-13 | Discharge: 2016-07-13 | Disposition: A | Discharge status: Home / Self Care | Payer: Medicare Other | Source: Ambulatory Visit | Attending: Surgery | Admitting: Surgery

## 2016-07-13 ENCOUNTER — Ambulatory Visit: Admission: RE | Admit: 2016-07-13 | Payer: Medicare Other | Source: Ambulatory Visit | Admitting: Surgery

## 2016-07-13 DIAGNOSIS — K630 Abscess of intestine: Secondary | ICD-10-CM

## 2016-07-14 ENCOUNTER — Other Ambulatory Visit: Payer: Self-pay | Admitting: Surgery

## 2016-07-14 ENCOUNTER — Ambulatory Visit
Admission: RE | Admit: 2016-07-14 | Discharge: 2016-07-14 | Disposition: A | Discharge status: Home / Self Care | Payer: Medicare Other | Source: Ambulatory Visit | Attending: Surgery | Admitting: Surgery

## 2016-07-14 DIAGNOSIS — I10 Essential (primary) hypertension: Secondary | ICD-10-CM | POA: Insufficient documentation

## 2016-07-14 DIAGNOSIS — X58XXXA Exposure to other specified factors, initial encounter: Secondary | ICD-10-CM | POA: Insufficient documentation

## 2016-07-14 DIAGNOSIS — K284 Chronic or unspecified gastrojejunal ulcer with hemorrhage: Secondary | ICD-10-CM | POA: Insufficient documentation

## 2016-07-14 DIAGNOSIS — R140 Abdominal distension (gaseous): Secondary | ICD-10-CM | POA: Insufficient documentation

## 2016-07-14 DIAGNOSIS — K630 Abscess of intestine: Secondary | ICD-10-CM

## 2016-07-14 DIAGNOSIS — T184XXA Foreign body in colon, initial encounter: Secondary | ICD-10-CM | POA: Diagnosis not present

## 2016-07-14 DIAGNOSIS — K274 Chronic or unspecified peptic ulcer, site unspecified, with hemorrhage: Secondary | ICD-10-CM | POA: Insufficient documentation

## 2016-07-14 IMAGING — CR DG ABDOMEN 2V
2 series · 2 of 2 positions shown · non-contrast
Comparison: [DATE]

ADDENDUM:
Additional clinical data:  Sigmoid colon foreign body:

Comparison CT scan [DATE].
Persistent linear foreign body within mid pelvis overlying left
lower sacrum probable within sigmoid colon measures about 1.1 cm in
length. This is poorly visualized by x-ray.
CLINICAL DATA: Sigmoid colon abscess
EXAM:
ABDOMEN - 2 VIEW

[abdomen erect]
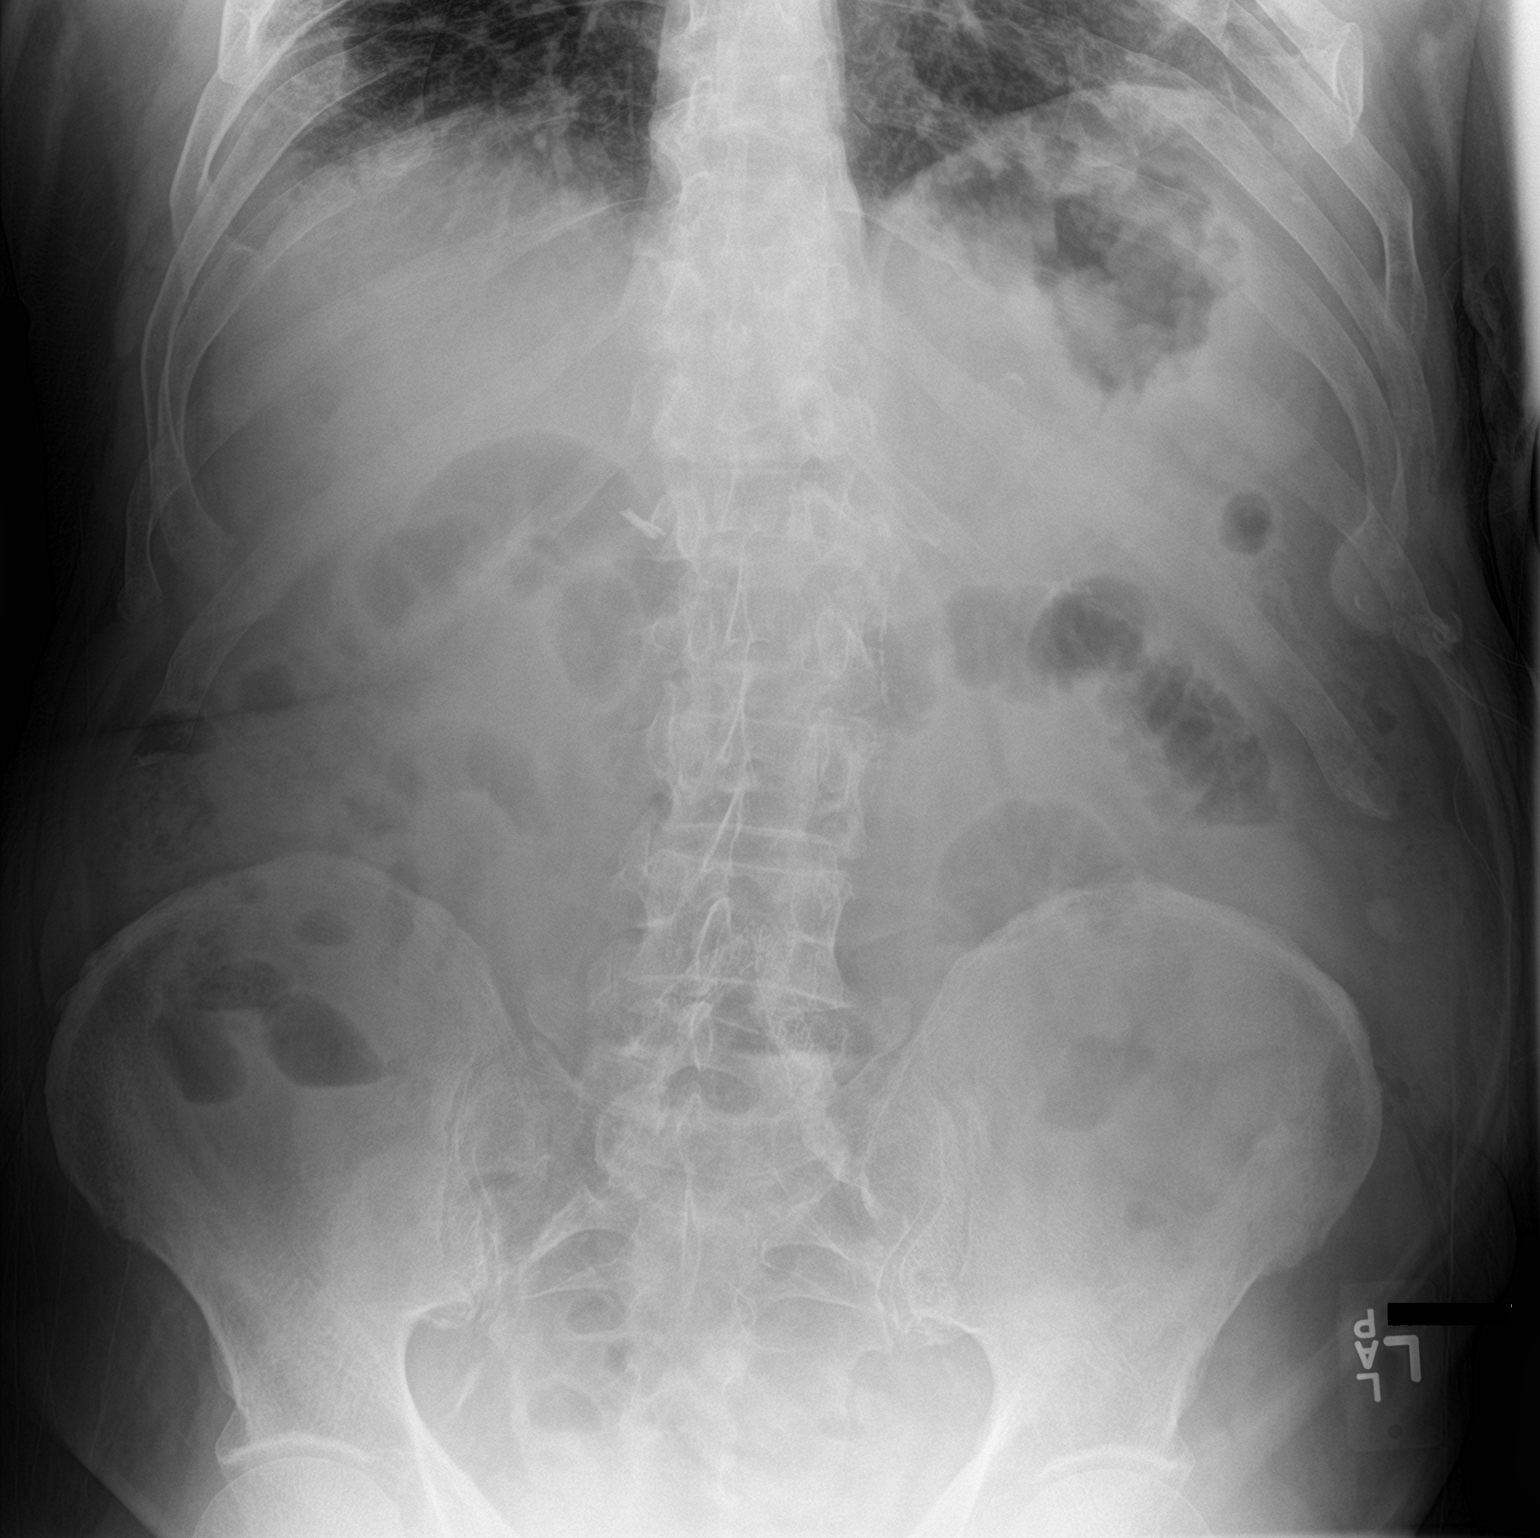

[abdomen supine]
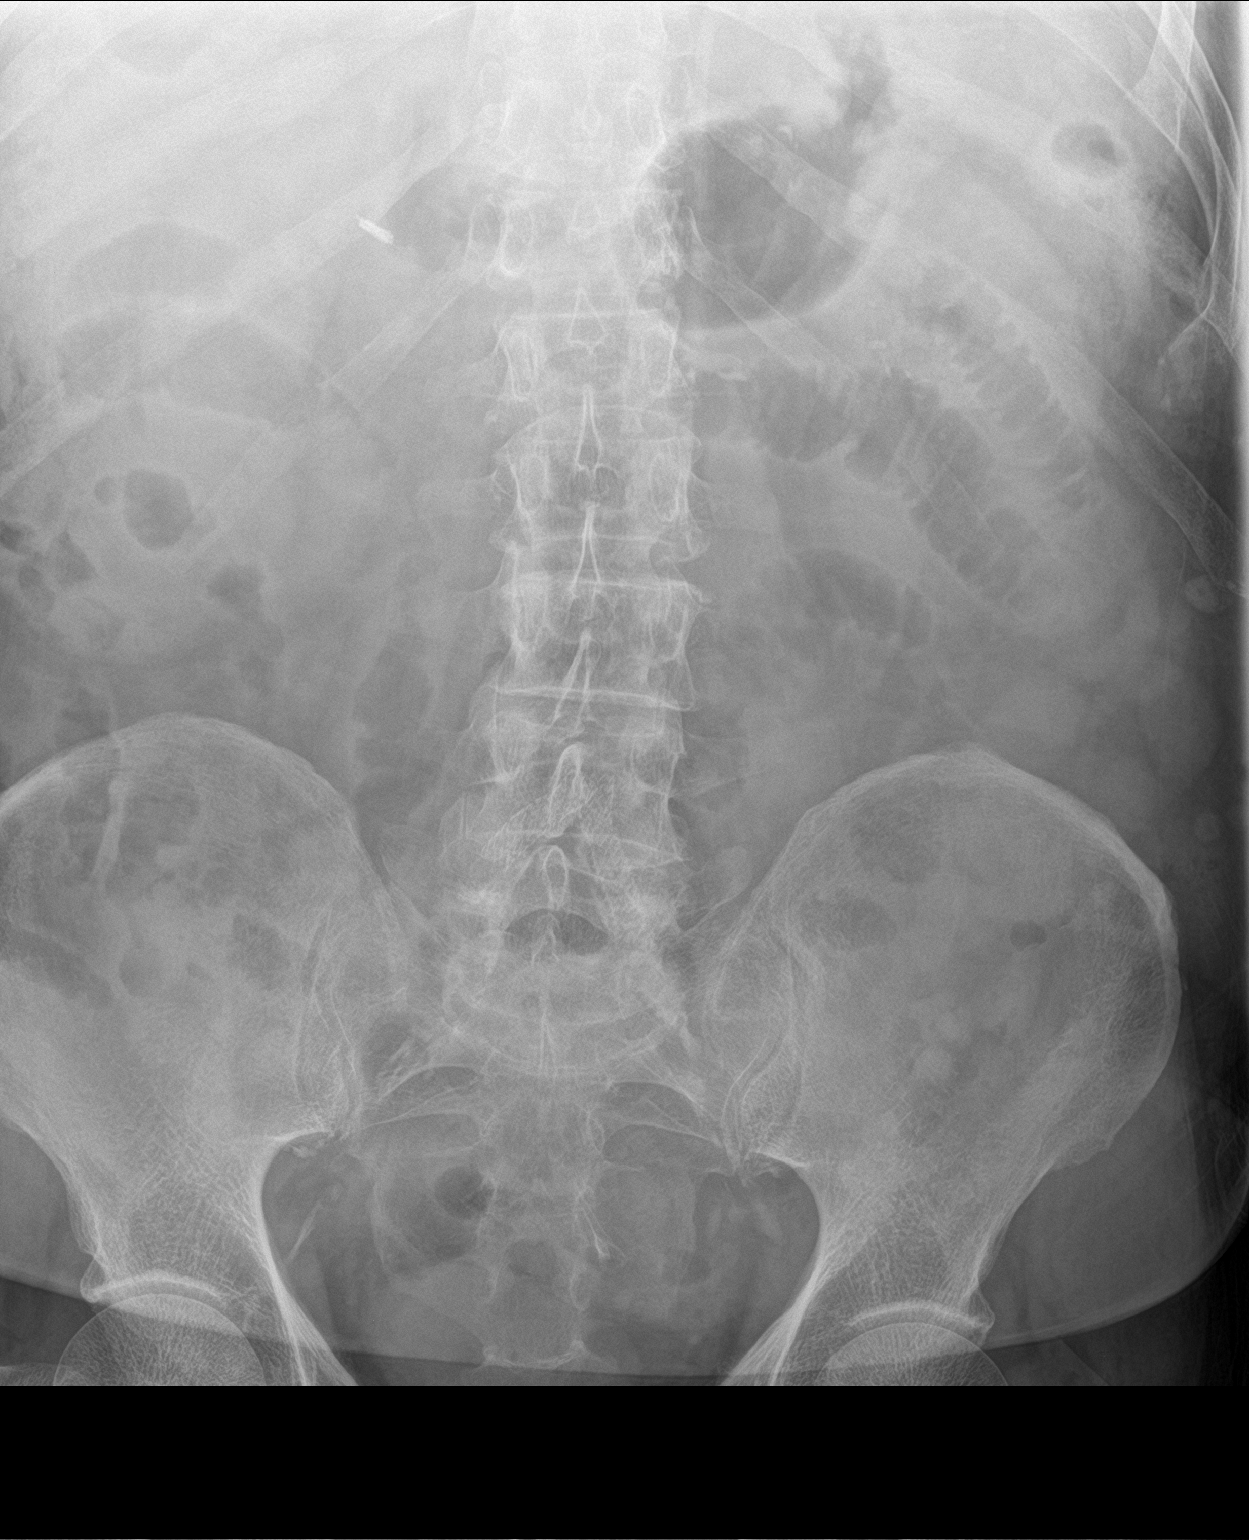

[2 of 2 positions shown; findings below may reference images not displayed]

FINDINGS: Mild gaseous distended small bowel loops in left abdomen probable
mild ileus or enteritis. No evidence of free abdominal air.
IMPRESSION: Mild gaseous distended small bowel loops in left abdomen suspicious
for ileus or enteritis.

## 2016-07-15 ENCOUNTER — Encounter: Payer: Self-pay | Admitting: Surgery

## 2016-07-15 ENCOUNTER — Ambulatory Visit (INDEPENDENT_AMBULATORY_CARE_PROVIDER_SITE_OTHER): Payer: Medicare Other | Admitting: Surgery

## 2016-07-15 ENCOUNTER — Other Ambulatory Visit: Payer: Self-pay

## 2016-07-15 VITALS — BP 142/72 | HR 75 | Temp 97.3°F | Ht 69.0 in | Wt 204.0 lb

## 2016-07-15 DIAGNOSIS — T189XXA Foreign body of alimentary tract, part unspecified, initial encounter: Secondary | ICD-10-CM

## 2016-07-15 MED ORDER — NA SULFATE-K SULFATE-MG SULF 17.5-3.13-1.6 GM/177ML PO SOLN: 1.0000 | ORAL | 0 refills | Status: DC

## 2016-07-15 NOTE — Patient Instructions (Signed)
Please have your colonoscopy done on 07/19/2016. We will send your prescription to your pharmacy.

## 2016-07-15 NOTE — Progress Notes (Addendum)
Patient ID: Alejandro Randolph, male   DOB: 02-25-1946, 70 y.o.   MRN: [id number]  HPI Alejandro Randolph is a 70 y.o. male is known to me with constipation that presented to the emergency room a few days ago and per her other workup including a CT scan. CT scan revealed evidence of a linear foreign body in the sigmoid colon. Some inflammation, no perforation. The patient abdomen was soft and benign. No evidence of perforation. I gave him the options to and follow-up as an outpatient to see if he will pass the foreign body. He did have a follow-up x-ray that I have personally reviewed and discussed with the radiologist , he still has the retained foreign body in the sigmoid. I discussed the case in detail with Dr. Vicente Males and I formulated a plan for colonoscopy in the OR next week under general anesthetic in case while retrieving the foreign body sigmoid colon was perforated. And she is another be available next week and Dr. Adonis Huguenin will be available. He is now having bowel movements and passing gas. No evidence of obstructions. No fevers no chills. He does not complain of abdominal pain at this time. He does have a lot of flatus.  HPI  Past Medical History:  Diagnosis Date  . Diabetes mellitus without complication ([place])   . Hypertension     Past Surgical History:  Procedure Laterality Date  . CHOLECYSTECTOMY    . femoral stents    . HERNIA REPAIR    . RETINAL DETACHMENT SURGERY    . TONSILLECTOMY      Family History  Problem Relation Age of Onset  . Diabetes Mother   . Hypertension Mother   . Diabetes Father     Social History Social History  Substance Use Topics  . Smoking status: Former Research scientist (life sciences)  . Smokeless tobacco: Never Used  . Alcohol use No    Allergies  Allergen Reactions  . Atorvastatin Rash    Hip pain    Current Outpatient Prescriptions  Medication Sig Dispense Refill  . amLODipine (NORVASC) 10 MG tablet Take 1 tablet by mouth daily.    Marland Kitchen aspirin EC 81 MG tablet Take 1  tablet by mouth daily.    . ciprofloxacin (CIPRO) 500 MG tablet Take 1 tablet (500 mg total) by mouth 2 (two) times daily. 20 tablet 0  . lisinopril-hydrochlorothiazide (PRINZIDE,ZESTORETIC) 20-12.5 MG tablet Take 1 tablet by mouth daily.    . metFORMIN (GLUCOPHAGE-XR) 500 MG 24 hr tablet Take 2 tablets by mouth daily.    . metroNIDAZOLE (FLAGYL) 500 MG tablet Take 1 tablet (500 mg total) by mouth 3 (three) times daily. 30 tablet 0  . Omega-3 Fatty Acids (OMEGA-3 FISH OIL) 500 MG CAPS Take 1 capsule by mouth daily.    . pantoprazole (PROTONIX) 40 MG tablet Take 1 tablet by mouth daily.    . rosuvastatin (CRESTOR) 5 MG tablet Take 1 tablet by mouth daily.     No current facility-administered medications for this visit.      Review of Systems A 10 point review of systems was asked and was negative except for the information on the HPI  Physical Exam Blood pressure (!) 142/72, pulse 75, temperature 97.3 F (36.3 C), temperature source Oral, height '5\' 9"'$  (1.753 m), weight 92.5 kg (204 lb). CONSTITUTIONAL: NAD EYES: Pupils are equal, round, and reactive to light, Sclera are non-icteric. EARS, NOSE, MOUTH AND THROAT: The oropharynx is clear. The oral mucosa is pink and moist. Hearing is  intact to voice. LYMPH NODES:  Lymph nodes in the neck are normal. RESPIRATORY:  Lungs are clear. There is normal respiratory effort, with equal breath sounds bilaterally, and without pathologic use of accessory muscles. CARDIOVASCULAR: Heart is regular without murmurs, gallops, or rubs. GI: The abdomen is soft, nontender, and nondistended. There are no palpable masses. There is no hepatosplenomegaly. There are normal bowel sounds in all quadrants. GU: Rectal deferred.   MUSCULOSKELETAL: Normal muscle strength and tone. No cyanosis or edema.   SKIN: Turgor is good and there are no pathologic skin lesions or ulcers. NEUROLOGIC: Motor and sensation is grossly normal. Cranial nerves are grossly intact. PSYCH:   Oriented to person, place and time. Affect is normal.  Data Reviewed I have personally reviewed the patient's imaging, laboratory findings and medical records.    Assessment/Plan 70 yo w foreign body in the sigmoid colon. Chronicity is still unknown. Plan for colonoscopy in the OR with Dr. Vicente Males, Dr. Adonis Huguenin will be available and ready  to intervene in case the foreign body perforates the sigmoid colon and he may require surgical intervention. Discussed with the patient in detail about the procedure. Risks benefits and possible complications including but not limited to: Bleeding, infection, perforation and rate interventions. He understands that he may have to stay in the hospital and the [facility] more procedures. We will plan for prep on Sunday and add him on the schedule for Dr. Vicente Males. Sensitive counseling provided   Caroleen Hamman, MD FACS General Surgeon 07/15/2016, 3:13 PM

## 2016-07-15 NOTE — Addendum Note (Signed)
Addended by: Caroleen Hamman F on: 07/15/2016 03:22 PM   Modules accepted: Orders, SmartSet

## 2016-07-19 ENCOUNTER — Encounter
Admission: RE | Disposition: A | Discharge status: Home / Self Care | Payer: Self-pay | Source: Ambulatory Visit | Attending: General Surgery

## 2016-07-19 ENCOUNTER — Ambulatory Visit: Payer: Medicare Other | Admitting: Registered Nurse

## 2016-07-19 ENCOUNTER — Observation Stay: Payer: Medicare Other

## 2016-07-19 ENCOUNTER — Observation Stay
Admission: RE | Admit: 2016-07-19 | Discharge: 2016-07-20 | Disposition: A | Discharge status: Home / Self Care | Payer: Medicare Other | Source: Ambulatory Visit | Attending: General Surgery | Admitting: General Surgery

## 2016-07-19 DIAGNOSIS — K5900 Constipation, unspecified: Secondary | ICD-10-CM | POA: Diagnosis not present

## 2016-07-19 DIAGNOSIS — E669 Obesity, unspecified: Secondary | ICD-10-CM | POA: Diagnosis not present

## 2016-07-19 DIAGNOSIS — J449 Chronic obstructive pulmonary disease, unspecified: Secondary | ICD-10-CM | POA: Insufficient documentation

## 2016-07-19 DIAGNOSIS — Z6830 Body mass index (BMI) 30.0-30.9, adult: Secondary | ICD-10-CM | POA: Insufficient documentation

## 2016-07-19 DIAGNOSIS — K449 Diaphragmatic hernia without obstruction or gangrene: Secondary | ICD-10-CM | POA: Insufficient documentation

## 2016-07-19 DIAGNOSIS — E7800 Pure hypercholesterolemia, unspecified: Secondary | ICD-10-CM | POA: Insufficient documentation

## 2016-07-19 DIAGNOSIS — Z87891 Personal history of nicotine dependence: Secondary | ICD-10-CM | POA: Insufficient documentation

## 2016-07-19 DIAGNOSIS — T184XXA Foreign body in colon, initial encounter: Principal | ICD-10-CM

## 2016-07-19 DIAGNOSIS — K219 Gastro-esophageal reflux disease without esophagitis: Secondary | ICD-10-CM | POA: Diagnosis not present

## 2016-07-19 DIAGNOSIS — K279 Peptic ulcer, site unspecified, unspecified as acute or chronic, without hemorrhage or perforation: Secondary | ICD-10-CM | POA: Diagnosis not present

## 2016-07-19 DIAGNOSIS — Z888 Allergy status to other drugs, medicaments and biological substances status: Secondary | ICD-10-CM | POA: Insufficient documentation

## 2016-07-19 DIAGNOSIS — I10 Essential (primary) hypertension: Secondary | ICD-10-CM | POA: Diagnosis not present

## 2016-07-19 DIAGNOSIS — E119 Type 2 diabetes mellitus without complications: Secondary | ICD-10-CM | POA: Insufficient documentation

## 2016-07-19 DIAGNOSIS — X58XXXA Exposure to other specified factors, initial encounter: Secondary | ICD-10-CM | POA: Diagnosis not present

## 2016-07-19 DIAGNOSIS — Z833 Family history of diabetes mellitus: Secondary | ICD-10-CM | POA: Insufficient documentation

## 2016-07-19 DIAGNOSIS — Z7984 Long term (current) use of oral hypoglycemic drugs: Secondary | ICD-10-CM | POA: Diagnosis not present

## 2016-07-19 DIAGNOSIS — Z8249 Family history of ischemic heart disease and other diseases of the circulatory system: Secondary | ICD-10-CM | POA: Insufficient documentation

## 2016-07-19 DIAGNOSIS — Z7982 Long term (current) use of aspirin: Secondary | ICD-10-CM | POA: Diagnosis not present

## 2016-07-19 HISTORY — PX: COLONOSCOPY WITH PROPOFOL: SHX5780

## 2016-07-19 HISTORY — PX: FLEXIBLE SIGMOIDOSCOPY: SHX5431

## 2016-07-19 HISTORY — DX: Abscess of intestine: K63.0

## 2016-07-19 HISTORY — DX: Gastro-esophageal reflux disease without esophagitis: K21.9

## 2016-07-19 HISTORY — DX: Pure hypercholesterolemia, unspecified: E78.00

## 2016-07-19 LAB — GLUCOSE, CAPILLARY
Glucose-Capillary: 111 mg/dL — ABNORMAL HIGH (ref 65–99)
Glucose-Capillary: 108 mg/dL — ABNORMAL HIGH (ref 65–99)
Glucose-Capillary: 106 mg/dL — ABNORMAL HIGH (ref 65–99)

## 2016-07-19 LAB — BASIC METABOLIC PANEL
Anion gap: 6 (ref 5–15)
BUN: 13 mg/dL (ref 6–20)
CO2: 27 mmol/L (ref 22–32)
Calcium: 9.3 mg/dL (ref 8.9–10.3)
Chloride: 105 mmol/L (ref 101–111)
Creatinine, Ser: 0.97 mg/dL (ref 0.61–1.24)
GFR calc Af Amer: >60 mL/min (ref >60)
GFR calc non Af Amer: >60 mL/min (ref >60)
Glucose, Bld: 111 mg/dL — ABNORMAL HIGH (ref 65–99)
Potassium: 3.6 mmol/L (ref 3.5–5.1)
Sodium: 138 mmol/L (ref 135–145)

## 2016-07-19 LAB — CBC
HCT: 41.1 % (ref 40.0–52.0)
Hemoglobin: 14 g/dL (ref 13.0–18.0)
MCH: 31.8 pg (ref 26.0–34.0)
MCHC: 34.1 g/dL (ref 32.0–36.0)
MCV: 93.1 fL (ref 80.0–100.0)
Platelets: 253 10*3/uL (ref 150–440)
RBC: 4.42 MIL/uL (ref 4.40–5.90)
RDW: 14 % (ref 11.5–14.5)
WBC: 8 10*3/uL (ref 3.8–10.6)

## 2016-07-19 IMAGING — DX DG ABD PORTABLE 2V
2 series · 2 of 2 positions shown · non-contrast
Comparison: Abdominal radiograph [DATE]

CT abdomen pelvis [DATE]

CLINICAL DATA: Foreign body removed from sigmoid colon. Evaluation
for fluid or perforation.

EXAM:
PORTABLE ABDOMEN - 2 VIEW

[abdomen erect]
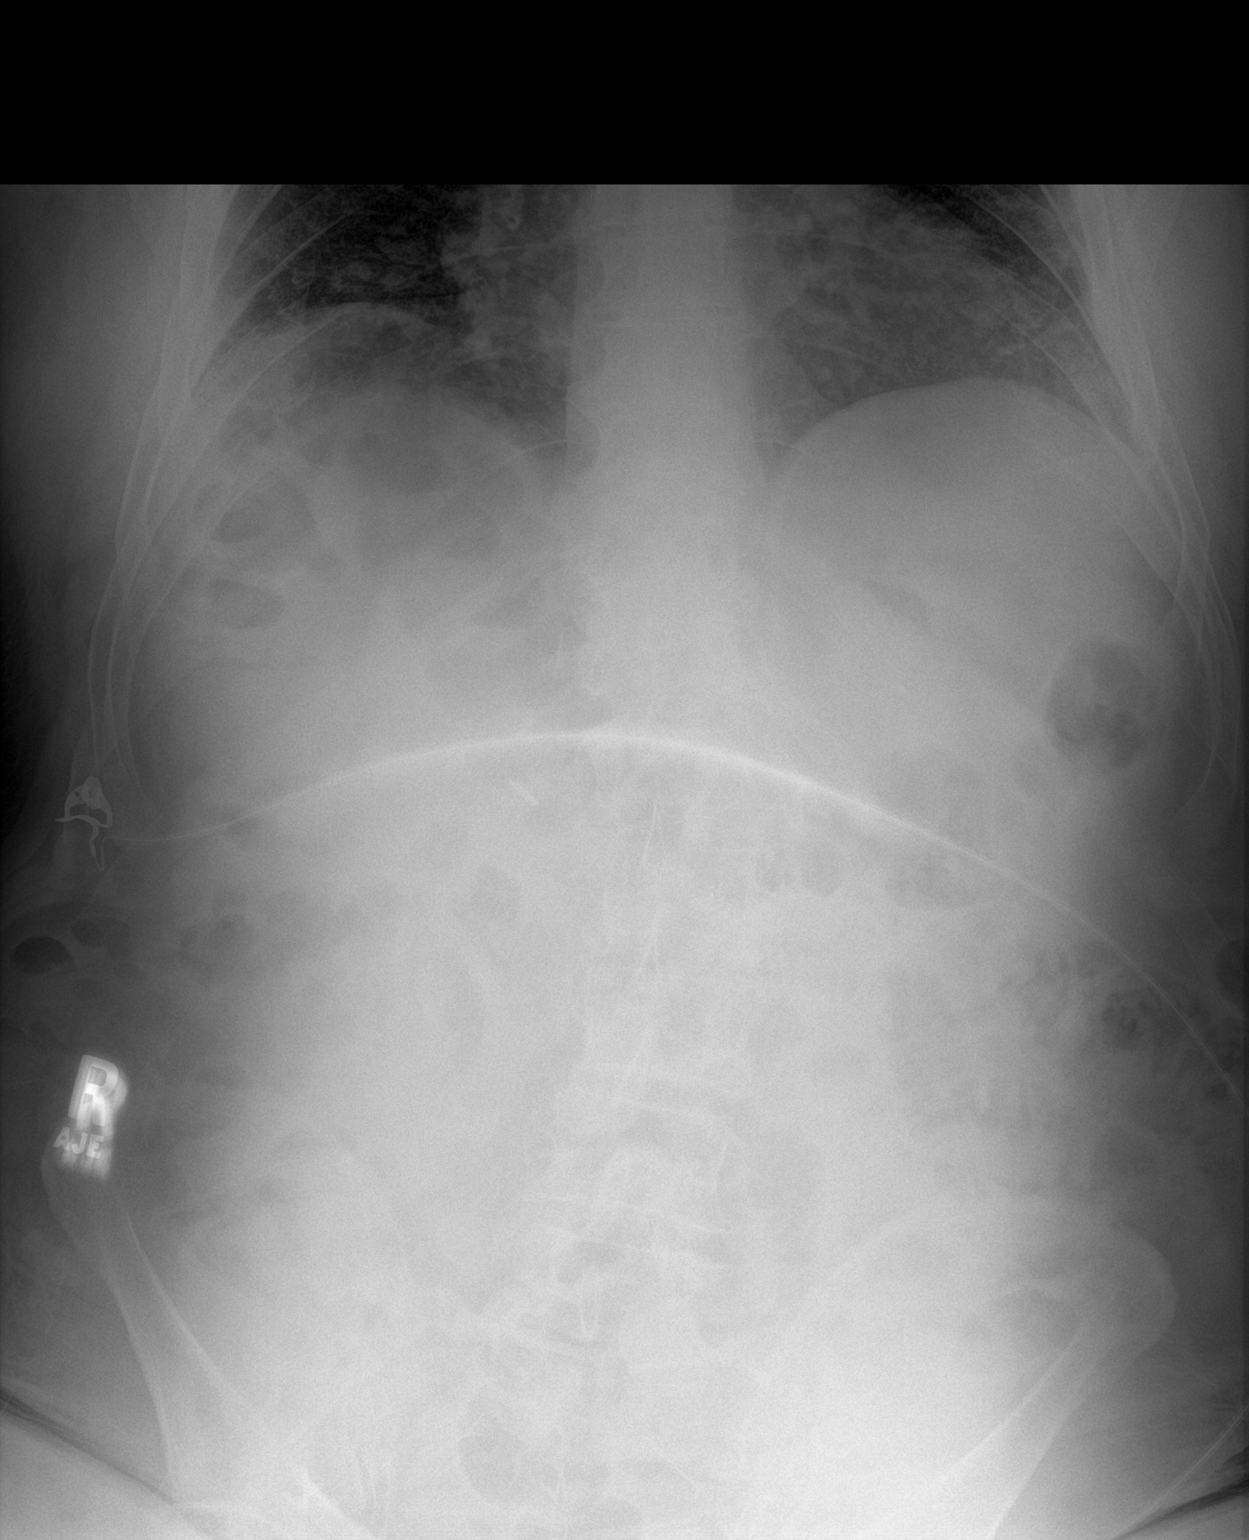

[abdomen supine]
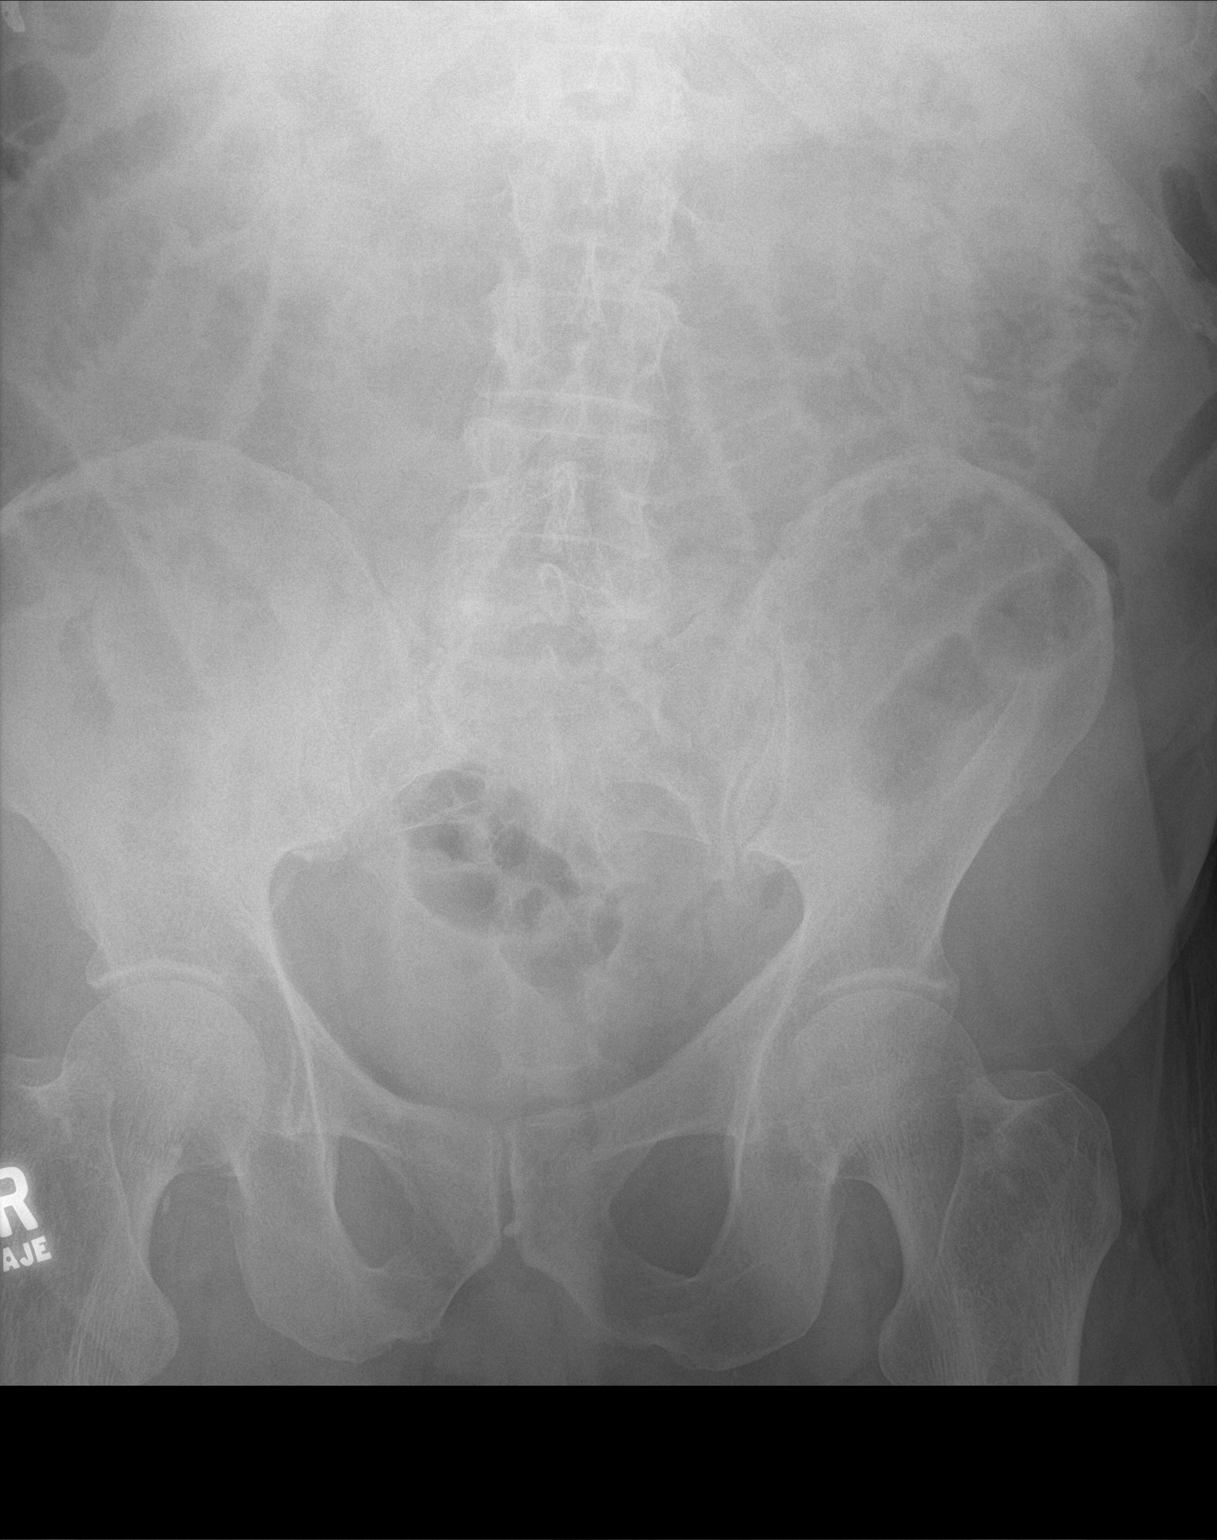

[2 of 2 positions shown; findings below may reference images not displayed]

FINDINGS: There is no free intraperitoneal air identified. Nonobstructive
bowel gas pattern. No radiopaque foreign bodies identified on the
current examination.
IMPRESSION: No free intraperitoneal air.

## 2016-07-19 SURGERY — LAPAROSCOPY, DIAGNOSTIC
Anesthesia: Choice

## 2016-07-19 SURGERY — COLONOSCOPY WITH PROPOFOL
Anesthesia: General

## 2016-07-19 MED ORDER — INSULIN ASPART 100 UNIT/ML ~~LOC~~ SOLN
0.0000 [IU] | SUBCUTANEOUS | Status: DC
  Administered 2016-07-20: 8 [IU] via SUBCUTANEOUS
  Filled 2016-07-19: qty 8

## 2016-07-19 MED ORDER — SODIUM CHLORIDE 0.9 % IV SOLN
INTRAVENOUS | Status: DC
  Administered 2016-07-19: 10:00:00 via INTRAVENOUS

## 2016-07-19 MED ORDER — HYDRALAZINE HCL 20 MG/ML IJ SOLN: 10.0000 mg | INTRAMUSCULAR | Status: DC | PRN

## 2016-07-19 MED ORDER — LACTATED RINGERS IV SOLN
INTRAVENOUS | Status: DC
  Administered 2016-07-19 – 2016-07-20 (×2): via INTRAVENOUS

## 2016-07-19 MED ORDER — LISINOPRIL-HYDROCHLOROTHIAZIDE 20-12.5 MG PO TABS: 1.0000 | ORAL_TABLET | Freq: Every day | ORAL | Status: DC

## 2016-07-19 MED ORDER — DIPHENHYDRAMINE HCL 12.5 MG/5ML PO ELIX: 12.5000 mg | ORAL_SOLUTION | Freq: Four times a day (QID) | ORAL | Status: DC | PRN

## 2016-07-19 MED ORDER — HYDROCHLOROTHIAZIDE 12.5 MG PO CAPS
12.5000 mg | ORAL_CAPSULE | Freq: Every day | ORAL | Status: DC
  Administered 2016-07-19 – 2016-07-20 (×2): 12.5 mg via ORAL
  Filled 2016-07-19 (×2): qty 1

## 2016-07-19 MED ORDER — MIDAZOLAM HCL 2 MG/2ML IJ SOLN
INTRAMUSCULAR | Status: DC | PRN
  Administered 2016-07-19: 1 mg via INTRAVENOUS

## 2016-07-19 MED ORDER — PROPOFOL 500 MG/50ML IV EMUL
INTRAVENOUS | Status: DC | PRN
  Administered 2016-07-19: 150 ug/kg/min via INTRAVENOUS

## 2016-07-19 MED ORDER — ONDANSETRON HCL 4 MG/2ML IJ SOLN: 4.0000 mg | Freq: Four times a day (QID) | INTRAMUSCULAR | Status: DC | PRN

## 2016-07-19 MED ORDER — PANTOPRAZOLE SODIUM 40 MG IV SOLR
40.0000 mg | Freq: Every day | INTRAVENOUS | Status: DC
  Administered 2016-07-19: 40 mg via INTRAVENOUS
  Filled 2016-07-19: qty 40

## 2016-07-19 MED ORDER — DIPHENHYDRAMINE HCL 50 MG/ML IJ SOLN: 12.5000 mg | Freq: Four times a day (QID) | INTRAMUSCULAR | Status: DC | PRN

## 2016-07-19 MED ORDER — ONDANSETRON HCL 4 MG/2ML IJ SOLN: 4.0000 mg | Freq: Once | INTRAMUSCULAR | Status: DC | PRN

## 2016-07-19 MED ORDER — AMLODIPINE BESYLATE 10 MG PO TABS
10.0000 mg | ORAL_TABLET | Freq: Every day | ORAL | Status: DC
  Administered 2016-07-19 – 2016-07-20 (×2): 10 mg via ORAL
  Filled 2016-07-19 (×2): qty 1

## 2016-07-19 MED ORDER — LISINOPRIL 20 MG PO TABS
20.0000 mg | ORAL_TABLET | Freq: Every day | ORAL | Status: DC
  Administered 2016-07-19 – 2016-07-20 (×2): 20 mg via ORAL
  Filled 2016-07-19 (×2): qty 1

## 2016-07-19 MED ORDER — MORPHINE SULFATE (PF) 4 MG/ML IV SOLN: 4.0000 mg | INTRAVENOUS | Status: DC | PRN

## 2016-07-19 MED ORDER — LACTATED RINGERS IV SOLN
INTRAVENOUS | Status: DC | PRN
  Administered 2016-07-19: 13:00:00 via INTRAVENOUS

## 2016-07-19 MED ORDER — PIPERACILLIN-TAZOBACTAM 3.375 G IVPB
3.3750 g | Freq: Three times a day (TID) | INTRAVENOUS | Status: DC
  Administered 2016-07-19 – 2016-07-20 (×3): 3.375 g via INTRAVENOUS
  Filled 2016-07-19 (×3): qty 50

## 2016-07-19 MED ORDER — FENTANYL CITRATE (PF) 100 MCG/2ML IJ SOLN: 25.0000 ug | INTRAMUSCULAR | Status: DC | PRN

## 2016-07-19 MED ORDER — PROPOFOL 10 MG/ML IV BOLUS
INTRAVENOUS | Status: DC | PRN
  Administered 2016-07-19: 50 mg via INTRAVENOUS

## 2016-07-19 MED ORDER — ONDANSETRON 4 MG PO TBDP: 4.0000 mg | ORAL_TABLET | Freq: Four times a day (QID) | ORAL | Status: DC | PRN

## 2016-07-19 NOTE — Anesthesia Preprocedure Evaluation (Signed)
Anesthesia Evaluation  Patient identified by MRN, date of birth, ID band Patient awake    Reviewed: Allergy & Precautions, NPO status , Patient's Chart, lab work & pertinent test results  Airway Mallampati: II       Dental  (+) Teeth Intact   Pulmonary COPD, former smoker,     + decreased breath sounds      Cardiovascular Exercise Tolerance: Good hypertension, Pt. on medications  Rhythm:Regular     Neuro/Psych    GI/Hepatic Neg liver ROS, hiatal hernia, PUD, GERD  Medicated,  Endo/Other  diabetes, Type 2, Oral Hypoglycemic Agents  Renal/GU negative Renal ROS     Musculoskeletal   Abdominal (+) + obese,   Peds negative pediatric ROS (+)  Hematology   Anesthesia Other Findings   Reproductive/Obstetrics                             Anesthesia Physical Anesthesia Plan  ASA: III  Anesthesia Plan: General   Post-op Pain Management:    Induction: Intravenous  Airway Management Planned: Natural Airway and Nasal Cannula  Additional Equipment:   Intra-op Plan:   Post-operative Plan:   Informed Consent: I have reviewed the patients History and Physical, chart, labs and discussed the procedure including the risks, benefits and alternatives for the proposed anesthesia with the patient or authorized representative who has indicated his/her understanding and acceptance.     Plan Discussed with: CRNA  Anesthesia Plan Comments:         Anesthesia Quick Evaluation

## 2016-07-19 NOTE — Progress Notes (Signed)
S/p endoscopic removal of FB sigmoid colon by Dr. Vicente Males.  No complaints. He is hungry AVSS  PE NAD Abd: soft, NT, no peritonitis  A/P Doing well  Continue observation and abd exams No need for emergent surgical intervention at this time

## 2016-07-19 NOTE — H&P (Signed)
Jonathon Bellows MD [address], [place], [place] [address] Phone: [phone] Fax : [phone]  Primary Care Physician:  Maryland Pink, MD Primary Gastroenterologist:  Dr. Jonathon Bellows   Pre-Procedure History & Physical: HPI:  Alejandro Randolph is a 70 y.o. male is here for an flexible sigmoidoscopy.   Past Medical History:  Diagnosis Date  . Abscess of sigmoid colon   . Diabetes mellitus without complication ([place])   . GERD (gastroesophageal reflux disease)   . Hypercholesteremia   . Hypertension     Past Surgical History:  Procedure Laterality Date  . CHOLECYSTECTOMY    . femoral stents    . HERNIA REPAIR    . RETINAL DETACHMENT SURGERY    . TONSILLECTOMY      Prior to Admission medications   Medication Sig Start Date End Date Taking? Authorizing Provider  amLODipine (NORVASC) 10 MG tablet Take 1 tablet by mouth daily. 06/23/16  Yes Historical Provider, MD  lisinopril-hydrochlorothiazide (PRINZIDE,ZESTORETIC) 20-12.5 MG tablet Take 1 tablet by mouth daily. 05/25/16  Yes Historical Provider, MD  Na Sulfate-K Sulfate-Mg Sulf (SUPREP BOWEL PREP KIT) 17.5-3.13-1.6 GM/180ML SOLN Take 1 kit by mouth as directed. Please take one bottle at 5:00 PM and the second bottle 4 hours prior to your procedure. 07/15/16  Yes Diego F Pabon, MD  Omega-3 Fatty Acids (OMEGA-3 FISH OIL) 500 MG CAPS Take 1 capsule by mouth daily.   Yes Historical Provider, MD  pantoprazole (PROTONIX) 40 MG tablet Take 1 tablet by mouth daily. 05/25/16  Yes Historical Provider, MD  rosuvastatin (CRESTOR) 5 MG tablet Take 1 tablet by mouth daily. 05/10/16  Yes Historical Provider, MD  aspirin EC 81 MG tablet Take 1 tablet by mouth daily.    Historical Provider, MD  ciprofloxacin (CIPRO) 500 MG tablet Take 1 tablet (500 mg total) by mouth 2 (two) times daily. 07/10/16   Delman Kitten, MD  metFORMIN (GLUCOPHAGE-XR) 500 MG 24 hr tablet Take 2 tablets by mouth daily. 06/05/16   Historical Provider, MD  metroNIDAZOLE  (FLAGYL) 500 MG tablet Take 1 tablet (500 mg total) by mouth 3 (three) times daily. 07/10/16   Delman Kitten, MD    Allergies as of 07/15/2016 - Review Complete 07/15/2016  Allergen Reaction Noted  . Atorvastatin Rash 04/02/2014    Family History  Problem Relation Age of Onset  . Diabetes Mother   . Hypertension Mother   . Diabetes Father     Social History   Social History  . Marital status: Married    Spouse name: N/A  . Number of children: N/A  . Years of education: N/A   Occupational History  . Not on file.   Social History Main Topics  . Smoking status: Former Research scientist (life sciences)  . Smokeless tobacco: Never Used  . Alcohol use No  . Drug use: No  . Sexual activity: Not on file   Other Topics Concern  . Not on file   Social History Narrative  . No narrative on file    Review of Systems: See HPI, otherwise negative ROS  Physical Exam: BP 130/69   Pulse 68   Temp (!) 96.9 F (36.1 C) (Tympanic)   Resp 18   Ht 5' 9"  (1.753 m)   Wt 203 lb (92.1 kg)   SpO2 98%   BMI 29.98 kg/m  General:   Alert,  pleasant and cooperative in NAD Head:  Normocephalic and atraumatic. Neck:  Supple; no masses or thyromegaly. Lungs:  Clear throughout to auscultation.  Heart:  Regular rate and rhythm. Abdomen:  Soft, nontender and nondistended. Normal bowel sounds, without guarding, and without rebound.   Neurologic:  Alert and  oriented x4;  grossly normal neurologically.  Impression/Plan: Jayan L Stehlin is here for an flexible sigmoidoscopy to be performed for retrieval of metallic foreign body seen on imaging in the sigmoid colon   Risks, benefits, limitations, and alternatives regarding  flexible sigmoidoscopy have been reviewed with the patient.  Questions have been answered.  All parties agreeable.   Jonathon Bellows, MD  07/19/2016, 12:36 PM

## 2016-07-19 NOTE — H&P (Signed)
Patient ID: Alejandro Randolph, male   DOB: Oct 13, 1945, 70 y.o.   MRN: [id number]  HPI Alejandro Randolph is a 70 y.o. male who was seen in clinic last week by my partner Dr. Dahlia Byes for possible foreign body within the sigmoid colon. Patient has a known history of constipation and was known to my partner. Per my partner the patient's abdomen was soft and benign without evidence of perforation. He is given numerous options and desired to undergo endoscopy and attempts to remove the foreign body. Colonoscopy was set up to be done by today by Dr. Vicente Males. Today patient reports no complaints. He denies any pain, fever, chills, nausea, vomiting, diarrhea, constipation.  HPI  Past Medical History:  Diagnosis Date  . Abscess of sigmoid colon   . Diabetes mellitus without complication ([place])   . GERD (gastroesophageal reflux disease)   . Hypercholesteremia   . Hypertension     Past Surgical History:  Procedure Laterality Date  . CHOLECYSTECTOMY    . femoral stents    . HERNIA REPAIR    . RETINAL DETACHMENT SURGERY    . TONSILLECTOMY      Family History  Problem Relation Age of Onset  . Diabetes Mother   . Hypertension Mother   . Diabetes Father     Social History Social History  Substance Use Topics  . Smoking status: Former Research scientist (life sciences)  . Smokeless tobacco: Never Used  . Alcohol use No    Allergies  Allergen Reactions  . Atorvastatin Rash    Hip pain    Current Facility-Administered Medications  Medication Dose Route Frequency Provider Last Rate Last Dose  . amLODipine (NORVASC) tablet 10 mg  10 mg Oral Daily Clayburn Pert, MD      . diphenhydrAMINE (BENADRYL) 12.5 MG/5ML elixir 12.5 mg  12.5 mg Oral Q6H PRN Clayburn Pert, MD       Or  . diphenhydrAMINE (BENADRYL) injection 12.5 mg  12.5 mg Intravenous Q6H PRN Clayburn Pert, MD      . hydrALAZINE (APRESOLINE) injection 10 mg  10 mg Intravenous Q2H PRN Clayburn Pert, MD      . lisinopril (PRINIVIL,ZESTRIL) tablet 20 mg  20 mg Oral  Daily Clayburn Pert, MD       And  . hydrochlorothiazide (MICROZIDE) capsule 12.5 mg  12.5 mg Oral Daily Clayburn Pert, MD      . insulin aspart (novoLOG) injection 0-15 Units  0-15 Units Subcutaneous Q4H Clayburn Pert, MD      . lactated ringers infusion   Intravenous Continuous Clayburn Pert, MD      . morphine 4 MG/ML injection 4 mg  4 mg Intravenous Q4H PRN Clayburn Pert, MD      . ondansetron (ZOFRAN-ODT) disintegrating tablet 4 mg  4 mg Oral Q6H PRN Clayburn Pert, MD       Or  . ondansetron [facility]) injection 4 mg  4 mg Intravenous Q6H PRN Clayburn Pert, MD      . pantoprazole (PROTONIX) injection 40 mg  40 mg Intravenous QHS Clayburn Pert, MD      . piperacillin-tazobactam (ZOSYN) IVPB 3.375 g  3.375 g Intravenous Q8H Clayburn Pert, MD         Review of Systems A Multi-point review of systems was asked and was negative except for the findings documented in the history of present illness  Physical Exam Blood pressure 140/60, pulse (!) 51, temperature 97.5 F (36.4 C), temperature source Oral, resp. rate 16, height '5\' 9"'$  (1.753 m), weight  81.8 kg (180 lb 4.8 oz), SpO2 99 %. CONSTITUTIONAL: No acute distress. EYES: Pupils are equal, round, and reactive to light, Sclera are non-icteric. EARS, NOSE, MOUTH AND THROAT: The oropharynx is clear. The oral mucosa is pink and moist. Hearing is intact to voice. LYMPH NODES:  Lymph nodes in the neck are normal. RESPIRATORY:  Lungs are clear. There is normal respiratory effort, with equal breath sounds bilaterally, and without pathologic use of accessory muscles. CARDIOVASCULAR: Heart is regular without murmurs, gallops, or rubs. GI: The abdomen is  soft, nontender, and nondistended. There are no palpable masses. There is no hepatosplenomegaly. There are normal bowel sounds in all quadrants. GU: Rectal deferred.   MUSCULOSKELETAL: Normal muscle strength and tone. No cyanosis or edema.   SKIN: Turgor is good and there are no  pathologic skin lesions or ulcers. NEUROLOGIC: Motor and sensation is grossly normal. Cranial nerves are grossly intact. PSYCH:  Oriented to person, place and time. Affect is normal.  Data Reviewed Colonoscopy and postoperative x-ray reviewed. No evidence of free air seen on x-ray. I have personally reviewed the patient's imaging, laboratory findings and medical records.    Assessment    Foreign body of the sigmoid colon    Plan    70 year old male underwent an endoscopic removal of a foreign body from his sigmoid colon. It appeared consistent with a bone. Due to its size and shape there is fear of perforation. Postoperative x-ray was reassuring as well as benign postoperative exam. Discussed with the patient would keep him in the hospital under observation tonight with serial exams and a repeat x-ray again in the morning. Assuming everything is benign and was started on a diet and discharged home tomorrow. Should to be any evidence of perforation he will then undergo surgical exploration for repair of the perforation.     Time spent with the patient was 30 minutes, with more than 50% of the time spent in face-to-face education, counseling and care coordination.     Clayburn Pert, MD FACS General Surgeon 07/19/2016, 3:41 PM

## 2016-07-19 NOTE — Progress Notes (Signed)
HEART rate low 50's   Blood pressure good  No new orders from dr Kayleen Memos

## 2016-07-19 NOTE — Anesthesia Postprocedure Evaluation (Signed)
Anesthesia Post Note  Patient: Alejandro Randolph  Procedure(s) Performed: Procedure(s) (LRB): COLONOSCOPY WITH PROPOFOL (N/A) FLEXIBLE SIGMOIDOSCOPY  Patient location during evaluation: PACU Anesthesia Type: General Level of consciousness: awake Pain management: pain level controlled Vital Signs Assessment: post-procedure vital signs reviewed and stable Respiratory status: spontaneous breathing Cardiovascular status: stable Anesthetic complications: no     Last Vitals:  Vitals:   07/19/16 1409 07/19/16 1410  BP: 123/67 123/67  Pulse: 64 68  Resp: 12 20  Temp: (!) 35.9 C (!) 35.9 C    Last Pain:  Vitals:   07/19/16 1410  TempSrc:   PainSc: 0-No pain                 VAN STAVEREN,Ryder Man

## 2016-07-19 NOTE — Transfer of Care (Signed)
Immediate Anesthesia Transfer of Care Note  Patient: Alejandro Randolph  Procedure(s) Performed: Procedure(s): COLONOSCOPY WITH PROPOFOL (N/A) FLEXIBLE SIGMOIDOSCOPY  Patient Location: PACU  Anesthesia Type:General  Level of Consciousness: awake, alert  and oriented  Airway & Oxygen Therapy: Patient Spontanous Breathing  Post-op Assessment: Report given to RN and Post -op Vital signs reviewed and stable  Post vital signs: Reviewed and stable  Last Vitals:  Vitals:   07/19/16 0935 07/19/16 1409  BP: 130/69 123/67  Pulse: 68 64  Resp: 18 12  Temp: (!) 36.1 C (!) 35.9 C    Last Pain:  Vitals:   07/19/16 1409  TempSrc: Temporal  PainSc:          Complications: No apparent anesthesia complications

## 2016-07-19 NOTE — Progress Notes (Signed)
Sigmoidoscopy:  At 25 cm mark a hard round foreign object seen protruding from mucosa, surrounding area inflamed, edematous, stenosed and could not traverse the point. The object felt very hard when grabbed with forceps and could not be dislodged.   Using a roth net the object was grabbed and pulled out. It appeared the object was a chicken bone- speciemen sent in a jar. I reinserted the scope to rexamine the area, could not go pass, Dr Adonis Huguenin was in the room. Did not attempt to force my way up.    Plan   1. Transfer to recovery  2. Xray abdomen to rule out perforation  3. Discussed with Dr Adonis Huguenin will be under surgery for observation.  4. In a few weeks after this episode has resolved will need a colonoscopy to rule lesion that's causing an obstruction in the sigmoid colon .    Dr Jonathon Bellows  Gastroenterology/Hepatology Pager: [phone]

## 2016-07-19 NOTE — Op Note (Signed)
[facility] Patient Name: Alejandro Randolph Procedure Date: 07/19/2016 1:08 PM MRN: [id number] Account #: [id number] Date of Birth: [birth year] Admit Type: Outpatient Age: 70 Room: [facility] ROOM 3 Gender: Male Note Status: Finalized Procedure:            Colonoscopy Providers:            Jonathon Bellows MD, MD Referring MD:         Irven Easterly. Kary Kos, MD (Referring MD) Medicines:            Monitored Anesthesia Care Complications:        No immediate complications. Procedure:            Pre-Anesthesia Assessment:                       - Prior to the procedure, a History and Physical was                        performed, and patient medications, allergies and                        sensitivities were reviewed. The patient's tolerance of                        previous anesthesia was reviewed.                       - The risks and benefits of the procedure and the                        sedation options and risks were discussed with the                        patient. All questions were answered and informed                        consent was obtained.                       - The risks and benefits of the procedure and the                        sedation options and risks were discussed with the                        patient. All questions were answered and informed                        consent was obtained.                       - ASA Grade Assessment: II - A patient with mild                        systemic disease.                       After obtaining informed consent, the colonoscope was                        passed under direct vision. Throughout the procedure,  the patient's blood pressure, pulse, and oxygen                        saturations were monitored continuously. The                        Colonoscope was introduced through the anus and                        advanced to the the sigmoid colon to examine a              stenosis. This was the intended extent. The colonoscopy                        was somewhat difficult due to poor endoscopic                        visualization. The patient tolerated the procedure                        well. The quality of the bowel preparation was adequate. Findings:      A foreign body was found in the mid sigmoid colon. At 20 cm mark , a       firm round brown colored foreign object seen protruding out of the lumen       , surrounding area markedly edematous, congested and lumen not visitble.       With forceps attempted to grab the object which felt hard/firm, unable       to dislodge. With help of a roth net was able to grip around the object       and dislodge and retrieve it. Subsequently the scope was reintriduced       and the prior location reached. The whole area was very edematous and I       did not attempt to go past the area. Impression:           - Foreign body in the mid sigmoid colon.                       - No specimens collected. Recommendation:       - Observe patient in hospital under surgeons Dr Adonis Huguenin                        24 hours To evaluate for perforation.                       - NPO today. Procedure Code(s):    --- Professional ---                       [id number], 53, Colonoscopy, flexible; diagnostic, including                        collection of specimen(s) by brushing or washing, when                        performed (separate procedure) Diagnosis Code(s):    --- Professional ---                       Q46.9GEX, Foreign body in colon, initial encounter CPT copyright 2016  American Medical Association. All rights reserved. The codes documented in this report are preliminary and upon coder review may  be revised to meet current compliance requirements. Jonathon Bellows, MD Jonathon Bellows MD, MD 07/19/2016 3:13:09 PM This report has been signed electronically. Number of Addenda: 0 Note Initiated On: 07/19/2016 1:08 PM      [facility]

## 2016-07-20 ENCOUNTER — Encounter: Payer: Self-pay | Admitting: Gastroenterology

## 2016-07-20 DIAGNOSIS — T188XXS Foreign body in other parts of alimentary tract, sequela: Secondary | ICD-10-CM | POA: Diagnosis not present

## 2016-07-20 DIAGNOSIS — T184XXA Foreign body in colon, initial encounter: Secondary | ICD-10-CM | POA: Diagnosis not present

## 2016-07-20 LAB — BASIC METABOLIC PANEL
Anion gap: 4 — ABNORMAL LOW (ref 5–15)
BUN: 12 mg/dL (ref 6–20)
CO2: 26 mmol/L (ref 22–32)
Calcium: 9.3 mg/dL (ref 8.9–10.3)
Chloride: 107 mmol/L (ref 101–111)
Creatinine, Ser: 1.01 mg/dL (ref 0.61–1.24)
GFR calc Af Amer: >60 mL/min (ref >60)
GFR calc non Af Amer: >60 mL/min (ref >60)
Glucose, Bld: 109 mg/dL — ABNORMAL HIGH (ref 65–99)
Potassium: 3.5 mmol/L (ref 3.5–5.1)
Sodium: 137 mmol/L (ref 135–145)

## 2016-07-20 LAB — CBC
HCT: 39.9 % — ABNORMAL LOW (ref 40.0–52.0)
Hemoglobin: 13.6 g/dL (ref 13.0–18.0)
MCH: 31.9 pg (ref 26.0–34.0)
MCHC: 34.1 g/dL (ref 32.0–36.0)
MCV: 93.6 fL (ref 80.0–100.0)
Platelets: 244 10*3/uL (ref 150–440)
RBC: 4.26 MIL/uL — ABNORMAL LOW (ref 4.40–5.90)
RDW: 14 % (ref 11.5–14.5)
WBC: 7.3 10*3/uL (ref 3.8–10.6)

## 2016-07-20 LAB — GLUCOSE, CAPILLARY
Glucose-Capillary: 104 mg/dL — ABNORMAL HIGH (ref 65–99)
Glucose-Capillary: 106 mg/dL — ABNORMAL HIGH (ref 65–99)
Glucose-Capillary: 112 mg/dL — ABNORMAL HIGH (ref 65–99)
Glucose-Capillary: 292 mg/dL — ABNORMAL HIGH (ref 65–99)

## 2016-07-20 NOTE — Progress Notes (Signed)
Alert and oriented. Vital signs stable . No signs of acute distress. Discharge instructions given. Patient verbalized understanding. No other issues noted at this time.    

## 2016-07-20 NOTE — Discharge Summary (Signed)
Patient ID: Alejandro Randolph MRN: [id number] DOB/AGE: 69-Jun-1947 69 y.o.  Admit date: 07/19/2016 Discharge date: 07/20/2016  Discharge Diagnoses:  Foreign Body in Sigmoid Colon   Procedures Performed: Sigmoidoscopy with removal of foreign body  Discharged Condition: good  Hospital Course: Taken to OR for sigmoidoscopy and removal of foreign body. Tolerated procedure well. On day of discharge was tolerating regular diet and pain free.  Discharge Orders: HOME  Disposition: 01-Home or Self Care  Discharge Medications: Allergies as of 07/20/2016      Reactions   Atorvastatin Rash   Hip pain      Medication List    STOP taking these medications   ciprofloxacin 500 MG tablet Commonly known as:  CIPRO   metroNIDAZOLE 500 MG tablet Commonly known as:  FLAGYL     TAKE these medications   amLODipine 10 MG tablet Commonly known as:  NORVASC Take 1 tablet by mouth daily.   aspirin EC 81 MG tablet Take 1 tablet by mouth daily.   lisinopril-hydrochlorothiazide 20-12.5 MG tablet Commonly known as:  PRINZIDE,ZESTORETIC Take 1 tablet by mouth daily.   metFORMIN 500 MG 24 hr tablet Commonly known as:  GLUCOPHAGE-XR Take 2 tablets by mouth daily.   Omega-3 Fish Oil 500 MG Caps Take 1 capsule by mouth daily.   pantoprazole 40 MG tablet Commonly known as:  PROTONIX Take 1 tablet by mouth daily.   rosuvastatin 5 MG tablet Commonly known as:  CRESTOR Take 1 tablet by mouth daily.        Follwup: Follow-up Information    Maryland Pink, MD. Schedule an appointment as soon as possible for a visit in 1 month(s).   Specialty:  Family Medicine Contact information: [address] [place] [address] [phone]           Signed: Clayburn Pert 07/20/2016, 12:04 PM

## 2016-07-20 NOTE — Discharge Instructions (Signed)
Swallowed Foreign Body, Adult A swallowed foreign body is an object that gets stuck in the tube that connects your throat to your stomach (esophagus) or in another part of your digestive tract. Foreign bodies may be swallowed by accident or on purpose. When you swallow an object, it passes into your esophagus. The narrowest place in your digestive system is where your esophagus meets your stomach. If the object can pass through that place, it will usually continue through the rest of your digestive system without causing problems. A foreign body that gets stuck may need to be removed. It is very important to tell your health care provider what you have swallowed. Certain swallowed items can be life-threatening. You may need emergency treatment. Dangerous swallowed foreign bodies include:  Objects that get stuck in your throat.  Objects that interfere with your breathing.  Sharp objects.  Harmful objects, such as batteries or illegal drugs. What are the causes? The most common swallowed foreign body is food that will not pass through your esophagus to your stomach (food impaction). Foods that commonly become impacted include meats and hard vegetables, such as carrots and radishes. Other common swallowed foreign bodies include:  Pieces of bone from meats.  Toothpicks.  Dentures. What increases the risk? You are more likely to have a swallowed foreign body if:  You wear dentures.  You have been drinking alcohol or taking drugs.  You have a mental health condition.  You have a narrowed or scarred area in your digestive tract. What are the signs or symptoms?  Pain or pressure in your throat or chest.  Not being able to swallow food or liquid.  Not being able to swallow your saliva.  Choking.  Hoarse voice.  Trouble breathing. How is this diagnosed? This condition may be diagnosed based on your symptoms and medical history. Your health care provider will do a physical exam to  confirm the diagnosis and to find the object. Imaging studies may be done, including:  X-rays.  A CT scan. Some objects may not be seen on imaging studies. In those cases, an exam may be done using a long tubelike scope to look into your esophagus (endoscopy). The tube (endoscope) that is used for this exam may be stiff (rigid) or flexible, depending on where the foreign body is stuck. How is this treated? Usually, an object that has passed into your stomach but is not dangerous will pass out of your digestive system without treatment. If the swallowed object is not dangerous but it is stuck in your esophagus:  You may be given medicine to relax the muscles of your esophagus to allow the object to pass through.  Endoscopy may be done to find and remove the object if it does not pass with medicine. Your health care provider will put medical instruments through the endoscope to remove the object. You may need emergency treatment if:  The object is in your esophagus and is causing you to inhale saliva into your lungs (aspirate).  The object is in your esophagus and is pressing on your airway. This makes it hard for you to breathe.  The object can damage your digestive tract. Some objects that can cause damage include batteries, magnets, sharp objects, and drugs. Follow these instructions at home: If the object in your digestive system is expected to pass:  Continue eating what you usually eat, unless your health care provider gives you different instructions.  Check your stool after every bowel movement to see if you  have passed the object.  Contact your health care provider if the object has not passed after 3 days. If you had endoscopic surgery to remove the foreign body:  Follow instructions from your health care provider about caring for yourself after the procedure. Keep all follow-up visits and repeat imaging tests as told by your health care provider. This is important. Contact a  health care provider if:  You continue to have symptoms of a swallowed foreign body.  The object has not passed out of your body after 3 days. Get help right away if:  You have a fever.  You have pain in your chest or your abdomen.  You cough up blood.  You have blood in your stool (feces) or your vomit. This information is not intended to replace advice given to you by your health care provider. Make sure you discuss any questions you have with your health care provider. Document Released: 01/06/2010 Document Revised: 12/24/2015 Document Reviewed: 10/16/2014 Elsevier Interactive Patient Education  2017 [employer].

## 2016-07-20 NOTE — Care Management Obs Status (Signed)
[place] NOTIFICATION   Patient Details  Name: Alejandro Randolph MRN: [id number] Date of Birth: May 19, 1946   Medicare Observation Status Notification Given:  No (Admitted observation less than 24 hours)    Beverly Sessions, RN 07/20/2016, 12:15 PM

## 2016-08-23 ENCOUNTER — Telehealth: Payer: Self-pay | Admitting: Gastroenterology

## 2016-08-23 NOTE — Telephone Encounter (Signed)
Patient had a colonoscopy with Dr. Vicente Males to retrieve a bone and was told he needed a repeat in 4 weeks. Please call to schedule.

## 2016-08-24 NOTE — Telephone Encounter (Signed)
Patient is returning your call regarding scheduling a colonoscopy

## 2016-08-24 NOTE — Telephone Encounter (Signed)
LVM for pt to return my call.

## 2016-08-26 NOTE — Telephone Encounter (Signed)
LVM for pt to return my call.

## 2016-08-27 ENCOUNTER — Other Ambulatory Visit: Payer: Self-pay

## 2016-08-27 NOTE — Telephone Encounter (Signed)
Pt scheduled for repeat colonoscopy at [facility] with Vicente Males on 09/02/16. Precert for Foreign body in digestive tract ST18.9XXA

## 2016-08-27 NOTE — Telephone Encounter (Signed)
Notification or Prior Authorization is not required for the requested services  This [employer] Medicare Advantage members plan does not currently require a prior authorization for these services. If you have general questions about the prior authorization requirements, please call us at [phone] or visit [email or web address] > Clinician Resources > Advance and Admission Notification Requirements. The number above acknowledges your notification. Please write this number down for future reference. Notification is not a guarantee of coverage or payment.  Decision ID #:[id number]

## 2016-08-30 ENCOUNTER — Other Ambulatory Visit: Payer: Self-pay

## 2016-08-30 MED ORDER — PEG 3350-KCL-NABCB-NACL-NASULF 236 G PO SOLR: ORAL | 0 refills | Status: DC

## 2016-09-01 ENCOUNTER — Encounter: Payer: Self-pay | Admitting: *Deleted

## 2016-09-02 ENCOUNTER — Ambulatory Visit: Payer: Medicare Other | Admitting: Anesthesiology

## 2016-09-02 ENCOUNTER — Encounter
Admission: RE | Disposition: A | Discharge status: Home / Self Care | Payer: Self-pay | Source: Ambulatory Visit | Attending: Gastroenterology

## 2016-09-02 ENCOUNTER — Ambulatory Visit
Admission: RE | Admit: 2016-09-02 | Discharge: 2016-09-02 | Disposition: A | Discharge status: Home / Self Care | Payer: Medicare Other | Source: Ambulatory Visit | Attending: Gastroenterology | Admitting: Gastroenterology

## 2016-09-02 DIAGNOSIS — Z7982 Long term (current) use of aspirin: Secondary | ICD-10-CM | POA: Diagnosis not present

## 2016-09-02 DIAGNOSIS — I10 Essential (primary) hypertension: Secondary | ICD-10-CM | POA: Insufficient documentation

## 2016-09-02 DIAGNOSIS — K56699 Other intestinal obstruction unspecified as to partial versus complete obstruction: Secondary | ICD-10-CM | POA: Insufficient documentation

## 2016-09-02 DIAGNOSIS — K648 Other hemorrhoids: Secondary | ICD-10-CM | POA: Diagnosis not present

## 2016-09-02 DIAGNOSIS — Z7984 Long term (current) use of oral hypoglycemic drugs: Secondary | ICD-10-CM | POA: Diagnosis not present

## 2016-09-02 DIAGNOSIS — K640 First degree hemorrhoids: Secondary | ICD-10-CM

## 2016-09-02 DIAGNOSIS — E7800 Pure hypercholesterolemia, unspecified: Secondary | ICD-10-CM | POA: Insufficient documentation

## 2016-09-02 DIAGNOSIS — K5730 Diverticulosis of large intestine without perforation or abscess without bleeding: Secondary | ICD-10-CM | POA: Diagnosis not present

## 2016-09-02 DIAGNOSIS — K219 Gastro-esophageal reflux disease without esophagitis: Secondary | ICD-10-CM | POA: Diagnosis not present

## 2016-09-02 DIAGNOSIS — E119 Type 2 diabetes mellitus without complications: Secondary | ICD-10-CM | POA: Insufficient documentation

## 2016-09-02 DIAGNOSIS — Z87891 Personal history of nicotine dependence: Secondary | ICD-10-CM | POA: Diagnosis not present

## 2016-09-02 HISTORY — PX: COLONOSCOPY WITH PROPOFOL: SHX5780

## 2016-09-02 LAB — GLUCOSE, CAPILLARY: Glucose-Capillary: 129 mg/dL — ABNORMAL HIGH (ref 65–99)

## 2016-09-02 SURGERY — COLONOSCOPY WITH PROPOFOL
Anesthesia: General

## 2016-09-02 MED ORDER — PROPOFOL 10 MG/ML IV BOLUS
INTRAVENOUS | Status: DC | PRN
  Administered 2016-09-02: 70 mg via INTRAVENOUS

## 2016-09-02 MED ORDER — PROPOFOL 10 MG/ML IV BOLUS
INTRAVENOUS | Status: AC
  Filled 2016-09-02: qty 40

## 2016-09-02 MED ORDER — LIDOCAINE HCL (CARDIAC) 20 MG/ML IV SOLN
INTRAVENOUS | Status: DC | PRN
  Administered 2016-09-02: 40 mg via INTRAVENOUS

## 2016-09-02 MED ORDER — LIDOCAINE HCL (PF) 2 % IJ SOLN
INTRAMUSCULAR | Status: AC
  Filled 2016-09-02: qty 2

## 2016-09-02 MED ORDER — PROPOFOL 500 MG/50ML IV EMUL
INTRAVENOUS | Status: DC | PRN
  Administered 2016-09-02: 150 ug/kg/min via INTRAVENOUS

## 2016-09-02 MED ORDER — SODIUM CHLORIDE 0.9 % IV SOLN
INTRAVENOUS | Status: DC
  Administered 2016-09-02: 08:00:00 via INTRAVENOUS

## 2016-09-02 NOTE — Anesthesia Post-op Follow-up Note (Cosign Needed)
Anesthesia QCDR form completed.        

## 2016-09-02 NOTE — Anesthesia Preprocedure Evaluation (Signed)
Anesthesia Evaluation  Patient identified by MRN, date of birth, ID band Patient awake    Reviewed: Allergy & Precautions, H&P , NPO status , Patient's Chart, lab work & pertinent test results, reviewed documented beta blocker date and time   Airway Mallampati: II   Neck ROM: full    Dental  (+) Poor Dentition   Pulmonary neg pulmonary ROS, former smoker,    Pulmonary exam normal        Cardiovascular hypertension, negative cardio ROS Normal cardiovascular exam Rhythm:regular Rate:Normal     Neuro/Psych negative neurological ROS  negative psych ROS   GI/Hepatic negative GI ROS, Neg liver ROS, PUD, GERD  Medicated,  Endo/Other  negative endocrine ROSdiabetes  Renal/GU negative Renal ROS  negative genitourinary   Musculoskeletal   Abdominal   Peds  Hematology negative hematology ROS (+)   Anesthesia Other Findings Past Medical History: No date: Abscess of sigmoid colon No date: Diabetes mellitus without complication (HCC) No date: GERD (gastroesophageal reflux disease) No date: Hypercholesteremia No date: Hypertension Past Surgical History: No date: CHOLECYSTECTOMY 07/19/2016: COLONOSCOPY WITH PROPOFOL N/A     Comment: Procedure: COLONOSCOPY WITH PROPOFOL;                Surgeon: Jonathon Bellows, MD;  Location: [facility] ORS;                Service: Endoscopy;  Laterality: N/A; No date: femoral stents 07/19/2016: FLEXIBLE SIGMOIDOSCOPY     Comment: Procedure: FLEXIBLE SIGMOIDOSCOPY;  Surgeon:               Jonathon Bellows, MD;  Location: [facility] ORS;  Service:               Endoscopy;; No date: HERNIA REPAIR No date: RETINAL DETACHMENT SURGERY No date: TONSILLECTOMY   Reproductive/Obstetrics negative OB ROS                             Anesthesia Physical Anesthesia Plan  ASA: II  Anesthesia Plan: General   Post-op Pain Management:    Induction:   Airway Management Planned:   Additional  Equipment:   Intra-op Plan:   Post-operative Plan:   Informed Consent: I have reviewed the patients History and Physical, chart, labs and discussed the procedure including the risks, benefits and alternatives for the proposed anesthesia with the patient or authorized representative who has indicated his/her understanding and acceptance.   Dental Advisory Given  Plan Discussed with: CRNA  Anesthesia Plan Comments:         Anesthesia Quick Evaluation

## 2016-09-02 NOTE — H&P (Signed)
Jonathon Bellows MD [address] [place],  [address] Phone: [phone] Fax : [phone]  Primary Care Physician:  [place] Pink, MD Primary Gastroenterologist:  Dr. Jonathon Bellows   Pre-Procedure History & Physical: HPI:  Alejandro Randolph is a 71 y.o. male is here for an colonoscopy.   Past Medical History:  Diagnosis Date  . Abscess of sigmoid colon   . Diabetes mellitus without complication ([place])   . GERD (gastroesophageal reflux disease)   . Hypercholesteremia   . Hypertension     Past Surgical History:  Procedure Laterality Date  . CHOLECYSTECTOMY    . COLONOSCOPY WITH PROPOFOL N/A 07/19/2016   Procedure: COLONOSCOPY WITH PROPOFOL;  Surgeon: Jonathon Bellows, MD;  Location: [facility];  Service: Endoscopy;  Laterality: N/A;  . femoral stents    . FLEXIBLE SIGMOIDOSCOPY  07/19/2016   Procedure: FLEXIBLE SIGMOIDOSCOPY;  Surgeon: Jonathon Bellows, MD;  Location: [facility];  Service: Endoscopy;;  . HERNIA REPAIR    . RETINAL DETACHMENT SURGERY    . TONSILLECTOMY      Prior to Admission medications   Medication Sig Start Date End Date Taking? Authorizing Provider  amLODipine (NORVASC) 10 MG tablet Take 1 tablet by mouth daily. 06/23/16  Yes Historical Provider, MD  metFORMIN (GLUCOPHAGE-XR) 500 MG 24 hr tablet Take 2 tablets by mouth daily. 06/05/16  Yes Historical Provider, MD  aspirin EC 81 MG tablet Take 1 tablet by mouth daily.    Historical Provider, MD  lisinopril-hydrochlorothiazide (PRINZIDE,ZESTORETIC) 20-12.5 MG tablet Take 1 tablet by mouth daily. 05/25/16   Historical Provider, MD  Omega-3 Fatty Acids (OMEGA-3 FISH OIL) 500 MG CAPS Take 1 capsule by mouth daily.    Historical Provider, MD  pantoprazole (PROTONIX) 40 MG tablet Take 1 tablet by mouth daily. 05/25/16   Historical Provider, MD  polyethylene glycol (GOLYTELY) 236 g solution Drink one 8 oz glass every 20 mins until stools are clear. 08/30/16   Lucilla Lame, MD  rosuvastatin (CRESTOR) 5 MG tablet Take 1 tablet  by mouth daily. 05/10/16   Historical Provider, MD    Allergies as of 08/27/2016 - Review Complete 07/20/2016  Allergen Reaction Noted  . Atorvastatin Rash 04/02/2014    Family History  Problem Relation Age of Onset  . Diabetes Mother   . Hypertension Mother   . Diabetes Father     Social History   Social History  . Marital status: Married    Spouse name: N/A  . Number of children: N/A  . Years of education: N/A   Occupational History  . Not on file.   Social History Main Topics  . Smoking status: Former Research scientist (life sciences)  . Smokeless tobacco: Never Used  . Alcohol use No  . Drug use: No  . Sexual activity: Not on file   Other Topics Concern  . Not on file   Social History Narrative  . No narrative on file    Review of Systems: See HPI, otherwise negative ROS  Physical Exam: BP (!) 133/53   Pulse 64   Temp (!) 96.9 F (36.1 C) (Tympanic)   Resp 16   Ht '5\' 9"'$  (1.753 m)   Wt 190 lb (86.2 kg)   SpO2 99%   BMI 28.06 kg/m  General:   Alert,  pleasant and cooperative in NAD Head:  Normocephalic and atraumatic. Neck:  Supple; no masses or thyromegaly. Lungs:  Clear throughout to auscultation.    Heart:  Regular rate and rhythm. Abdomen:  Soft, nontender and nondistended. Normal  bowel sounds, without guarding, and without rebound.   Neurologic:  Alert and  oriented x4;  grossly normal neurologically.  Impression/Plan: Alejandro Randolph is here for an colonoscopy to be performed for evaluation of bowel obstruction   Risks, benefits, limitations, and alternatives regarding  colonoscopy have been reviewed with the patient.  Questions have been answered.  All parties agreeable.   Jonathon Bellows, MD  09/02/2016, 7:36 AM

## 2016-09-02 NOTE — Anesthesia Procedure Notes (Signed)
Date/Time: 09/02/2016 8:10 AM Performed by: Hedda Slade Pre-anesthesia Checklist: Patient identified, Emergency Drugs available, Suction available and Patient being monitored Oxygen Delivery Method: Nasal cannula

## 2016-09-02 NOTE — Op Note (Signed)
[facility] Patient Name: Alejandro Randolph Procedure Date: 09/02/2016 8:12 AM MRN: [id number] Account #: [id number] Date of Birth: [birth year] Admit Type: Outpatient Age: 71 Room: [facility] ENDO ROOM 4 Gender: Male Note Status: Finalized Procedure:            Colonoscopy Indications:          Exclusion of colonic obstruction Providers:            Jonathon Bellows MD, MD Referring MD:         Irven Easterly. Kary Kos, MD (Referring MD) Medicines:            Monitored Anesthesia Care Complications:        No immediate complications. Procedure:            Pre-Anesthesia Assessment:                       - Prior to the procedure, a History and Physical was                        performed, and patient medications, allergies and                        sensitivities were reviewed. The patient's tolerance of                        previous anesthesia was reviewed.                       - The risks and benefits of the procedure and the                        sedation options and risks were discussed with the                        patient. All questions were answered and informed                        consent was obtained.                       - The risks and benefits of the procedure and the                        sedation options and risks were discussed with the                        patient. All questions were answered and informed                        consent was obtained.                       - ASA Grade Assessment: II - A patient with mild                        systemic disease.                       After obtaining informed consent, the colonoscope was  passed under direct vision. Throughout the procedure,                        the patient's blood pressure, pulse, and oxygen                        saturations were monitored continuously. The                        Colonoscope was introduced through the anus and   advanced to the the cecum, identified by the                        appendiceal orifice, IC valve and transillumination.                        The colonoscopy was performed with ease. The patient                        tolerated the procedure well. The quality of the bowel                        preparation was good. Findings:      The perianal and digital rectal examinations were normal.      Multiple medium-mouthed diverticula were found in the sigmoid colon.      The sigmoid colon lumen was narrowed due to significant diverticulosis       but could be traversed with a regular adult colonoscope [Traversed].      Non-bleeding internal hemorrhoids were found during retroflexion. The       hemorrhoids were small and Grade I (internal hemorrhoids that do not       prolapse).      The exam was otherwise without abnormality on direct and retroflexion       views. Impression:           - Diverticulosis in the sigmoid colon.                       - Stricture in the sigmoid colon.                       - Non-bleeding internal hemorrhoids.                       - The examination was otherwise normal on direct and                        retroflexion views.                       - No specimens collected. Recommendation:       - Discharge patient to home (with escort).                       - Resume previous diet.                       - Continue present medications.                       - Repeat colonoscopy in 10 years for screening purposes. Procedure Code(s):    --- Professional ---  45378, Colonoscopy, flexible; diagnostic, including                        collection of specimen(s) by brushing or washing, when                        performed (separate procedure) Diagnosis Code(s):    --- Professional ---                       K64.0, First degree hemorrhoids                       K56.69, Other intestinal obstruction                       K57.30, Diverticulosis of large  intestine without                        perforation or abscess without bleeding CPT copyright 2016 American Medical Association. All rights reserved. The codes documented in this report are preliminary and upon coder review may  be revised to meet current compliance requirements. Jonathon Bellows, MD Jonathon Bellows MD, MD 09/02/2016 8:39:34 AM This report has been signed electronically. Number of Addenda: 0 Note Initiated On: 09/02/2016 8:12 AM Scope Withdrawal Time: 0 hours 16 minutes 32 seconds  Total Procedure Duration: 0 hours 19 minutes 52 seconds       [facility]

## 2016-09-02 NOTE — Transfer of Care (Signed)
Immediate Anesthesia Transfer of Care Note  Patient: Alejandro Randolph  Procedure(s) Performed: Procedure(s): COLONOSCOPY WITH PROPOFOL (N/A)  Patient Location: PACU  Anesthesia Type:General  Level of Consciousness: awake, alert  and oriented  Airway & Oxygen Therapy: Patient Spontanous Breathing and Patient connected to nasal cannula oxygen  Post-op Assessment: Report given to RN and Post -op Vital signs reviewed and stable  Post vital signs: Reviewed and stable  Last Vitals:  Vitals:   09/02/16 0721 09/02/16 0840  BP: (!) 133/53 (!) 96/54  Pulse: 64 (!) 54  Resp: 16 11  Temp: (!) 36.1 C (!) 36.1 C    Last Pain:  Vitals:   09/02/16 0840  TempSrc: Tympanic         Complications: No apparent anesthesia complications

## 2016-09-06 ENCOUNTER — Encounter: Payer: Self-pay | Admitting: Gastroenterology

## 2016-09-08 NOTE — Anesthesia Postprocedure Evaluation (Signed)
Anesthesia Post Note  Patient: Alejandro Randolph  Procedure(s) Performed: Procedure(s) (LRB): COLONOSCOPY WITH PROPOFOL (N/A)  Patient location during evaluation: PACU Anesthesia Type: General Level of consciousness: awake and alert Pain management: pain level controlled Vital Signs Assessment: post-procedure vital signs reviewed and stable Respiratory status: spontaneous breathing, nonlabored ventilation, respiratory function stable and patient connected to nasal cannula oxygen Cardiovascular status: blood pressure returned to baseline and stable Postop Assessment: no signs of nausea or vomiting Anesthetic complications: no     Last Vitals:  Vitals:   09/02/16 0900 09/02/16 0910  BP: 113/67 (!) 97/46  Pulse: (!) 57 (!) 53  Resp: 16 11  Temp:      Last Pain:  Vitals:   09/02/16 0840  TempSrc: Tympanic                 Molli Barrows

## 2017-04-08 ENCOUNTER — Other Ambulatory Visit (INDEPENDENT_AMBULATORY_CARE_PROVIDER_SITE_OTHER): Payer: Self-pay | Admitting: Vascular Surgery

## 2017-04-08 DIAGNOSIS — I70213 Atherosclerosis of native arteries of extremities with intermittent claudication, bilateral legs: Secondary | ICD-10-CM

## 2017-04-11 ENCOUNTER — Ambulatory Visit (INDEPENDENT_AMBULATORY_CARE_PROVIDER_SITE_OTHER): Payer: Medicare Other | Admitting: Vascular Surgery

## 2017-04-11 ENCOUNTER — Ambulatory Visit (INDEPENDENT_AMBULATORY_CARE_PROVIDER_SITE_OTHER): Payer: Medicare Other

## 2017-04-11 ENCOUNTER — Encounter (INDEPENDENT_AMBULATORY_CARE_PROVIDER_SITE_OTHER): Payer: Self-pay | Admitting: Vascular Surgery

## 2017-04-11 VITALS — BP 134/72 | HR 67 | Resp 17 | Wt 205.0 lb

## 2017-04-11 DIAGNOSIS — I70213 Atherosclerosis of native arteries of extremities with intermittent claudication, bilateral legs: Secondary | ICD-10-CM

## 2017-04-11 DIAGNOSIS — E782 Mixed hyperlipidemia: Secondary | ICD-10-CM | POA: Diagnosis not present

## 2017-04-11 DIAGNOSIS — M79604 Pain in right leg: Secondary | ICD-10-CM | POA: Diagnosis not present

## 2017-04-11 DIAGNOSIS — I10 Essential (primary) hypertension: Secondary | ICD-10-CM | POA: Diagnosis not present

## 2017-04-11 DIAGNOSIS — M79606 Pain in leg, unspecified: Secondary | ICD-10-CM | POA: Insufficient documentation

## 2017-04-11 DIAGNOSIS — I70219 Atherosclerosis of native arteries of extremities with intermittent claudication, unspecified extremity: Secondary | ICD-10-CM | POA: Insufficient documentation

## 2017-04-11 DIAGNOSIS — M79605 Pain in left leg: Secondary | ICD-10-CM

## 2017-04-11 DIAGNOSIS — E785 Hyperlipidemia, unspecified: Secondary | ICD-10-CM | POA: Insufficient documentation

## 2017-04-11 NOTE — Progress Notes (Signed)
MRN : [id number]  Alejandro Randolph is a 71 y.o. (1946-07-19) male who presents with chief complaint of  Chief Complaint  Patient presents with  . Follow-up    34yr abi,aorta iliac  .  History of Present Illness:   The patient is seen for follow up of ASO with claudication.  He is s/p (Oct 2013):  1.    Abdominal aortogram.  2.          Bilateral lower extremity distal runoff.  3.          Introduction catheter into aorta, left groin.  4.          Introduction catheter into aorta, right groin.  5.          Percutaneous transluminal angioplasty, left common iliac to 8 mm.  6.          Percutaneous transluminal angioplasty, right common iliac to 8 mm.  The patient returns to the office for followup and review of the noninvasive studies. There have been no interval changes in lower extremity symptoms. No interval shortening of the patient's claudication distance or development of rest pain symptoms. No new ulcers or wounds have occurred since the last visit.  There have been no significant changes to the patient's overall health care.  The patient denies amaurosis fugax or recent TIA symptoms. There are no recent neurological changes noted. The patient denies history of DVT, PE or superficial thrombophlebitis. The patient denies recent episodes of angina or shortness of breath.   ABI Rt=1.07 and Lt=1.13  (previous ABI's Rt=1.07 and Lt=1.16) Duplex ultrasound of the aorta iliac system shows the iliac stents are widely patnent  Current Meds  Medication Sig  . amLODipine (NORVASC) 10 MG tablet Take 1 tablet by mouth daily.  Marland Kitchen aspirin EC 81 MG tablet Take 1 tablet by mouth daily.  Marland Kitchen lisinopril-hydrochlorothiazide (PRINZIDE,ZESTORETIC) 20-12.5 MG tablet Take 1 tablet by mouth daily.  . metFORMIN (GLUCOPHAGE-XR) 500 MG 24 hr tablet Take 2 tablets by mouth daily.  . Omega-3 Fatty Acids (OMEGA-3 FISH OIL) 500 MG CAPS Take 1 capsule by mouth daily.  . pantoprazole (PROTONIX) 40 MG tablet  Take 1 tablet by mouth daily.  . polyethylene glycol (GOLYTELY) 236 g solution Drink one 8 oz glass every 20 mins until stools are clear.  . rosuvastatin (CRESTOR) 5 MG tablet Take 1 tablet by mouth daily.    Past Medical History:  Diagnosis Date  . Abscess of sigmoid colon   . Diabetes mellitus without complication ([place])   . GERD (gastroesophageal reflux disease)   . Hypercholesteremia   . Hypertension     Past Surgical History:  Procedure Laterality Date  . CHOLECYSTECTOMY    . COLONOSCOPY WITH PROPOFOL N/A 07/19/2016   Procedure: COLONOSCOPY WITH PROPOFOL;  Surgeon: Jonathon Bellows, MD;  Location: [facility] ORS;  Service: Endoscopy;  Laterality: N/A;  . COLONOSCOPY WITH PROPOFOL N/A 09/02/2016   Procedure: COLONOSCOPY WITH PROPOFOL;  Surgeon: Jonathon Bellows, MD;  Location: [facility] ENDOSCOPY;  Service: Endoscopy;  Laterality: N/A;  . femoral stents    . FLEXIBLE SIGMOIDOSCOPY  07/19/2016   Procedure: FLEXIBLE SIGMOIDOSCOPY;  Surgeon: Jonathon Bellows, MD;  Location: [facility] OR[facility];  Service: Endoscopy;;  . HERNIA REPAIR    . RETINAL DETACHMENT SURGERY    . TONSILLECTOMY      Social History Social History  Substance Use Topics  . Smoking status: Former Research scientist (life sciences)  . Smokeless tobacco: Never Used  . Alcohol use No    Family  History Family History  Problem Relation Age of Onset  . Diabetes Mother   . Hypertension Mother   . Diabetes Father     Allergies  Allergen Reactions  . Atorvastatin Rash    Hip pain     REVIEW OF SYSTEMS (Negative unless checked)  Constitutional: [] Weight loss  [] Fever  [] Chills Cardiac: [] Chest pain   [] Chest pressure   [] Palpitations   [] Shortness of breath when laying flat   [] Shortness of breath with exertion. Vascular:  [] Pain in legs with walking   [] Pain in legs at rest  [] History of DVT   [] Phlebitis   [] Swelling in legs   [] Varicose veins   [] Non-healing ulcers Pulmonary:   [] Uses home oxygen   [] Productive cough   [] Hemoptysis   [] Wheeze  [] COPD    [] Asthma Neurologic:  [] Dizziness   [] Seizures   [] History of stroke   [] History of TIA  [] Aphasia   [] Vissual changes   [] Weakness or numbness in arm   [] Weakness or numbness in leg Musculoskeletal:   [] Joint swelling   [] Joint pain   [] Low back pain Hematologic:  [] Easy bruising  [] Easy bleeding   [] Hypercoagulable state   [] Anemic Gastrointestinal:  [] Diarrhea   [] Vomiting  [] Gastroesophageal reflux/heartburn   [] Difficulty swallowing. Genitourinary:  [] Chronic kidney disease   [] Difficult urination  [] Frequent urination   [] Blood in urine Skin:  [] Rashes   [] Ulcers  Psychological:  [] History of anxiety   []  History of major depression.  Physical Examination  Vitals:   04/11/17 0835  BP: 134/72  Pulse: 67  Resp: 17  Weight: 93 kg (205 lb)   Body mass index is 30.27 kg/m. Gen: WD/WN, NAD Head: [place]/AT, No temporalis wasting.  Ear/Nose/Throat: Hearing grossly intact, nares w/o erythema or drainage Eyes: PER, EOMI, sclera nonicteric.  Neck: Supple, no large masses.   Pulmonary:  Good air movement, no audible wheezing bilaterally, no use of accessory muscles.  Cardiac: RRR, no JVD Vascular:  Vessel Right Left  Radial Palpable Palpable  Ulnar Palpable Palpable  Brachial Palpable Palpable  Carotid Palpable Palpable  Femoral Palpable Palpable  Popliteal Palpable Palpable  PT Not Palpable Palpable  DP Palpable Palpable  Gastrointestinal: Non-distended. No guarding/no peritoneal signs.  Musculoskeletal: M/S 5/5 throughout.  No deformity or atrophy.  Neurologic: CN 2-12 intact. Symmetrical.  Speech is fluent. Motor exam as listed above. Psychiatric: Judgment intact, Mood & affect appropriate for pt's clinical situation. Dermatologic: No rashes or ulcers noted.  No changes consistent with cellulitis. Lymph : No lichenification or skin changes of chronic lymphedema.  CBC Lab Results  Component Value Date   WBC 7.3 07/20/2016   HGB 13.6 07/20/2016   HCT 39.9 (L) 07/20/2016   MCV  93.6 07/20/2016   PLT 244 07/20/2016    BMET    Component Value Date/Time   NA 137 07/20/2016 0514   NA 142 05/02/2012 0812   K 3.5 07/20/2016 0514   K 3.9 05/02/2012 0812   CL 107 07/20/2016 0514   CL 107 05/02/2012 0812   CO2 26 07/20/2016 0514   CO2 29 05/02/2012 0812   GLUCOSE 109 (H) 07/20/2016 0514   GLUCOSE 137 (H) 05/02/2012 0812   BUN 12 07/20/2016 0514   BUN 12 05/02/2012 0812   CREATININE 1.01 07/20/2016 0514   CREATININE 0.88 05/02/2012 0812   CALCIUM 9.3 07/20/2016 0514   CALCIUM 9.8 05/02/2012 0812   GFRNONAA >60 07/20/2016 0514   GFRNONAA >60 05/02/2012 0812   GFRAA >60 07/20/2016 1610  GFRAA >60 05/02/2012 0812   CrCl cannot be calculated (Patient's most recent lab result is older than the maximum 21 days allowed.).  COAG No results found for: INR, PROTIME  Radiology No results found.  Assessment/Plan 1. Atherosclerosis of native artery of both lower extremities with intermittent claudication (HCC)  Recommend:  The patient has evidence of atherosclerosis of the lower extremities with claudication.  The patient does not voice lifestyle limiting changes at this point in time.  Noninvasive studies do not suggest clinically significant change.  No invasive studies, angiography or surgery at this time The patient should continue walking and begin a more formal exercise program.  The patient should continue antiplatelet therapy and aggressive treatment of the lipid abnormalities  No changes in the patient's medications at this time  The patient should continue wearing graduated compression socks 10-15 mmHg strength to control the mild edema.    2. Pain in both lower extremities Doing well essentially resolved  Continue walking and exercise  3. Essential hypertension Continue antihypertensive medications as already ordered, these medications have been reviewed and there are no changes at this time.   4. Mixed hyperlipidemia Continue statin as  ordered and reviewed, no changes at this time     Hortencia Pilar, MD  04/11/2017 8:42 AM

## 2018-04-13 ENCOUNTER — Ambulatory Visit (INDEPENDENT_AMBULATORY_CARE_PROVIDER_SITE_OTHER): Payer: Medicare Other

## 2018-04-13 ENCOUNTER — Encounter (INDEPENDENT_AMBULATORY_CARE_PROVIDER_SITE_OTHER): Payer: Self-pay

## 2018-04-13 ENCOUNTER — Encounter (INDEPENDENT_AMBULATORY_CARE_PROVIDER_SITE_OTHER): Payer: Self-pay | Admitting: Vascular Surgery

## 2018-04-13 ENCOUNTER — Ambulatory Visit (INDEPENDENT_AMBULATORY_CARE_PROVIDER_SITE_OTHER): Payer: Medicare Other | Admitting: Vascular Surgery

## 2018-04-13 VITALS — BP 119/65 | HR 57 | Resp 16 | Ht 69.0 in | Wt 207.6 lb

## 2018-04-13 DIAGNOSIS — E1151 Type 2 diabetes mellitus with diabetic peripheral angiopathy without gangrene: Secondary | ICD-10-CM | POA: Diagnosis not present

## 2018-04-13 DIAGNOSIS — I10 Essential (primary) hypertension: Secondary | ICD-10-CM

## 2018-04-13 DIAGNOSIS — I70213 Atherosclerosis of native arteries of extremities with intermittent claudication, bilateral legs: Secondary | ICD-10-CM | POA: Diagnosis not present

## 2018-04-13 DIAGNOSIS — E119 Type 2 diabetes mellitus without complications: Secondary | ICD-10-CM | POA: Insufficient documentation

## 2018-04-13 DIAGNOSIS — E782 Mixed hyperlipidemia: Secondary | ICD-10-CM | POA: Diagnosis not present

## 2018-04-13 NOTE — Progress Notes (Signed)
MRN : [id number]  Alejandro Randolph is a 72 y.o. (1945-08-26) male who presents with chief complaint of  Chief Complaint  Patient presents with  . Follow-up    39yr ultrasound follow up  .  History of Present Illness:   The patient is seen for follow up of ASO with claudication.  He is s/p (Oct 2013):  1.Abdominal aortogram.  2.Bilateral lower extremity distal runoff.  3.Introduction catheter into aorta, left groin.  4.Introduction catheter into aorta, right groin.  5.Percutaneous transluminal angioplasty, left common iliac to 8 mm.  6.Percutaneous transluminal angioplasty, right common iliac to 8 mm.  The patient returns to the office for followup and review of the noninvasive studies. There have been no interval changes in lower extremity symptoms. No interval shortening of the patient's claudication distance or development of rest pain symptoms. No new ulcers or wounds have occurred since the last visit.  There have been no significant changes to the patient's overall health care.  The patient denies amaurosis fugax or recent TIA symptoms. There are no recent neurological changes noted. The patient denies history of DVT, PE or superficial thrombophlebitis. The patient denies recent episodes of angina or shortness of breath.   ABI Rt=1.07 and Lt=1.13  (previous ABI's Rt=1.07 and Lt=1.16) Duplex ultrasound of the aorta iliac system shows the iliac stents are widely patnent  Current Meds  Medication Sig  . amLODipine (NORVASC) 10 MG tablet Take 1 tablet by mouth daily.  Marland Kitchen aspirin EC 81 MG tablet Take 1 tablet by mouth daily.  Marland Kitchen lisinopril-hydrochlorothiazide (PRINZIDE,ZESTORETIC) 20-12.5 MG tablet Take 1 tablet by mouth daily.  . metFORMIN (GLUCOPHAGE-XR) 500 MG 24 hr tablet Take 2 tablets by mouth daily.  . Omega-3 Fatty Acids (OMEGA-3 FISH OIL) 500 MG CAPS Take 1 capsule by mouth daily.  . pantoprazole (PROTONIX) 40  MG tablet Take 1 tablet by mouth daily.  . polyethylene glycol (GOLYTELY) 236 g solution Drink one 8 oz glass every 20 mins until stools are clear.  . rosuvastatin (CRESTOR) 5 MG tablet Take 1 tablet by mouth daily.    Past Medical History:  Diagnosis Date  . Abscess of sigmoid colon   . Diabetes mellitus without complication ([place])   . GERD (gastroesophageal reflux disease)   . Hypercholesteremia   . Hypertension     Past Surgical History:  Procedure Laterality Date  . CHOLECYSTECTOMY    . COLONOSCOPY WITH PROPOFOL N/A 07/19/2016   Procedure: COLONOSCOPY WITH PROPOFOL;  Surgeon: Jonathon Bellows, MD;  Location: [facility];  Service: Endoscopy;  Laterality: N/A;  . COLONOSCOPY WITH PROPOFOL N/A 09/02/2016   Procedure: COLONOSCOPY WITH PROPOFOL;  Surgeon: Jonathon Bellows, MD;  Location: [facility];  Service: Endoscopy;  Laterality: N/A;  . femoral stents    . FLEXIBLE SIGMOIDOSCOPY  07/19/2016   Procedure: FLEXIBLE SIGMOIDOSCOPY;  Surgeon: Jonathon Bellows, MD;  Location: [facility];  Service: Endoscopy;;  . HERNIA REPAIR    . RETINAL DETACHMENT SURGERY    . TONSILLECTOMY      Social History Social History   Tobacco Use  . Smoking status: Former Research scientist (life sciences)  . Smokeless tobacco: Never Used  Substance Use Topics  . Alcohol use: No  . Drug use: No    Family History Family History  Problem Relation Age of Onset  . Diabetes Mother   . Hypertension Mother   . Diabetes Father     Allergies  Allergen Reactions  . Atorvastatin Rash    Hip pain  REVIEW OF SYSTEMS (Negative unless checked)  Constitutional: [] Weight loss  [] Fever  [] Chills Cardiac: [] Chest pain   [] Chest pressure   [] Palpitations   [] Shortness of breath when laying flat   [] Shortness of breath with exertion. Vascular:  [] Pain in legs with walking   [] Pain in legs at rest  [] History of DVT   [] Phlebitis   [] Swelling in legs   [] Varicose veins   [] Non-healing ulcers Pulmonary:   [] Uses home oxygen   [] Productive cough    [] Hemoptysis   [] Wheeze  [] COPD   [] Asthma Neurologic:  [] Dizziness   [] Seizures   [] History of stroke   [] History of TIA  [] Aphasia   [] Vissual changes   [] Weakness or numbness in arm   [] Weakness or numbness in leg Musculoskeletal:   [] Joint swelling   [] Joint pain   [] Low back pain Hematologic:  [] Easy bruising  [] Easy bleeding   [] Hypercoagulable state   [] Anemic Gastrointestinal:  [] Diarrhea   [] Vomiting  [] Gastroesophageal reflux/heartburn   [] Difficulty swallowing. Genitourinary:  [] Chronic kidney disease   [] Difficult urination  [] Frequent urination   [] Blood in urine Skin:  [] Rashes   [] Ulcers  Psychological:  [] History of anxiety   []  History of major depression.  Physical Examination  Vitals:   04/13/18 0835  BP: 119/65  Pulse: (!) 57  Resp: 16  Weight: 207 lb 9.6 oz (94.2 kg)  Height: 5\' 9"  (1.753 m)   Body mass index is 30.66 kg/m. Gen: WD/WN, NAD Head: [place]/AT, No temporalis wasting.  Ear/Nose/Throat: Hearing grossly intact, nares w/o erythema or drainage Eyes: PER, EOMI, sclera nonicteric.  Neck: Supple, no large masses.   Pulmonary:  Good air movement, no audible wheezing bilaterally, no use of accessory muscles.  Cardiac: RRR, no JVD Vascular:  No carotid bruits Vessel Right Left  Radial Palpable Palpable  PT Palpable Palpable  DP Palpable Palpable  Gastrointestinal: Non-distended. No guarding/no peritoneal signs.  Musculoskeletal: M/S 5/5 throughout.  No deformity or atrophy.  Neurologic: CN 2-12 intact. Symmetrical.  Speech is fluent. Motor exam as listed above. Psychiatric: Judgment intact, Mood & affect appropriate for pt's clinical situation. Dermatologic: No rashes or ulcers noted.  No changes consistent with cellulitis. Lymph : No lichenification or skin changes of chronic lymphedema.  CBC Lab Results  Component Value Date   WBC 7.3 07/20/2016   HGB 13.6 07/20/2016   HCT 39.9 (L) 07/20/2016   MCV 93.6 07/20/2016   PLT 244 07/20/2016    BMET      Component Value Date/Time   NA 137 07/20/2016 0514   NA 142 05/02/2012 0812   K 3.5 07/20/2016 0514   K 3.9 05/02/2012 0812   CL 107 07/20/2016 0514   CL 107 05/02/2012 0812   CO2 26 07/20/2016 0514   CO2 29 05/02/2012 0812   GLUCOSE 109 (H) 07/20/2016 0514   GLUCOSE 137 (H) 05/02/2012 0812   BUN 12 07/20/2016 0514   BUN 12 05/02/2012 0812   CREATININE 1.01 07/20/2016 0514   CREATININE 0.88 05/02/2012 0812   CALCIUM 9.3 07/20/2016 0514   CALCIUM 9.8 05/02/2012 0812   GFRNONAA >60 07/20/2016 0514   GFRNONAA >60 05/02/2012 0812   GFRAA >60 07/20/2016 0514   GFRAA >60 05/02/2012 0812   CrCl cannot be calculated (Patient's most recent lab result is older than the maximum 21 days allowed.).  COAG No results found for: INR, PROTIME  Radiology No results found.  Assessment/Plan 1. Atherosclerosis of native artery of both lower extremities with intermittent claudication ([place])  Recommend:  The patient has evidence of atherosclerosis of the lower extremities with claudication.  The patient does not voice lifestyle limiting changes at this point in time.  Noninvasive studies do not suggest clinically significant change.  No invasive studies, angiography or surgery at this time The patient should continue walking and begin a more formal exercise program.  The patient should continue antiplatelet therapy and aggressive treatment of the lipid abnormalities  No changes in the patient's medications at this time  The patient has been stable for 6 years so he will follow up PRN if his claudication symptoms return  2. Mixed hyperlipidemia Continue statin as ordered and reviewed, no changes at this time   3. Essential hypertension Continue antihypertensive medications as already ordered, these medications have been reviewed and there are no changes at this time.   4. Type 2 diabetes mellitus with diabetic peripheral angiopathy without gangrene, without long-term current use of  insulin ([place]) Continue hypoglycemic medications as already ordered, these medications have been reviewed and there are no changes at this time.  Hgb A1C to be monitored as already arranged by primary service     Hortencia Pilar, MD  04/13/2018 9:31 AM

## 2019-08-15 ENCOUNTER — Ambulatory Visit (INDEPENDENT_AMBULATORY_CARE_PROVIDER_SITE_OTHER): Payer: Medicare PPO

## 2019-08-15 ENCOUNTER — Encounter: Payer: Self-pay | Admitting: Podiatry

## 2019-08-15 ENCOUNTER — Ambulatory Visit: Payer: Medicare PPO | Admitting: Podiatry

## 2019-08-15 ENCOUNTER — Other Ambulatory Visit: Payer: Self-pay

## 2019-08-15 VITALS — BP 128/59 | HR 83 | Resp 16

## 2019-08-15 DIAGNOSIS — M7751 Other enthesopathy of right foot: Secondary | ICD-10-CM

## 2019-08-15 DIAGNOSIS — M722 Plantar fascial fibromatosis: Secondary | ICD-10-CM | POA: Diagnosis not present

## 2019-08-15 MED ORDER — MELOXICAM 15 MG PO TABS: 15.0000 mg | ORAL_TABLET | Freq: Every day | ORAL | 3 refills | Status: DC

## 2019-08-15 NOTE — Patient Instructions (Signed)

## 2019-08-15 NOTE — Progress Notes (Signed)
Subjective:  Patient ID: Alejandro Randolph, male    DOB: 1946-05-25,  MRN: [id number] HPI Chief Complaint  Patient presents with  . Foot Pain    Plantar heel right - aching x 1 month, AM pain, "runs up into my knee", no treatment  . New Patient (Initial Visit)    74 y.o. male presents with the above complaint.   ROS: Denies fever chills nausea vomiting muscle aches pains calf pain back pain chest pain shortness of breath.  Past Medical History:  Diagnosis Date  . Abscess of sigmoid colon   . Diabetes mellitus without complication ([place])   . GERD (gastroesophageal reflux disease)   . Hypercholesteremia   . Hypertension    Past Surgical History:  Procedure Laterality Date  . CHOLECYSTECTOMY    . COLONOSCOPY WITH PROPOFOL N/A 07/19/2016   Procedure: COLONOSCOPY WITH PROPOFOL;  Surgeon: Jonathon Bellows, MD;  Location: [facility];  Service: Endoscopy;  Laterality: N/A;  . COLONOSCOPY WITH PROPOFOL N/A 09/02/2016   Procedure: COLONOSCOPY WITH PROPOFOL;  Surgeon: Jonathon Bellows, MD;  Location: [facility];  Service: Endoscopy;  Laterality: N/A;  . femoral stents    . FLEXIBLE SIGMOIDOSCOPY  07/19/2016   Procedure: FLEXIBLE SIGMOIDOSCOPY;  Surgeon: Jonathon Bellows, MD;  Location: [facility];  Service: Endoscopy;;  . HERNIA REPAIR    . RETINAL DETACHMENT SURGERY    . TONSILLECTOMY      Current Outpatient Medications:  .  Saw Palmetto, Serenoa repens, (SAW PALMETTO PO), Take by mouth., Disp: , Rfl:  .  amLODipine (NORVASC) 10 MG tablet, Take 1 tablet by mouth daily., Disp: , Rfl:  .  aspirin EC 81 MG tablet, Take 1 tablet by mouth daily., Disp: , Rfl:  .  lisinopril-hydrochlorothiazide (PRINZIDE,ZESTORETIC) 20-12.5 MG tablet, Take 1 tablet by mouth daily., Disp: , Rfl:  .  meloxicam (MOBIC) 15 MG tablet, Take 1 tablet (15 mg total) by mouth daily., Disp: 30 tablet, Rfl: 3 .  metFORMIN (GLUCOPHAGE-XR) 500 MG 24 hr tablet, Take 2 tablets by mouth daily., Disp: , Rfl:  .  Omega-3 Fatty Acids (OMEGA-3  FISH OIL) 500 MG CAPS, Take 1 capsule by mouth daily., Disp: , Rfl:  .  pantoprazole (PROTONIX) 40 MG tablet, Take 1 tablet by mouth daily., Disp: , Rfl:  .  rosuvastatin (CRESTOR) 5 MG tablet, Take 1 tablet by mouth daily., Disp: , Rfl:   Allergies  Allergen Reactions  . Atorvastatin Rash    Hip pain   Review of Systems Objective:   Vitals:   08/15/19 1442  BP: (!) 128/59  Pulse: 83  Resp: 16    General: Well developed, nourished, in no acute distress, alert and oriented x3   Dermatological: Skin is warm, dry and supple bilateral. Nails x 10 are well maintained; remaining integument appears unremarkable at this time. There are no open sores, no preulcerative lesions, no rash or signs of infection present.  Vascular: Dorsalis Pedis artery and Posterior Tibial artery pedal pulses are 2/4 bilateral with immedate capillary fill time. Pedal hair growth present. No varicosities and no lower extremity edema present bilateral.   Neruologic: Grossly intact via light touch bilateral. Vibratory intact via tuning fork bilateral. Protective threshold with Semmes Wienstein monofilament intact to all pedal sites bilateral. Patellar and Achilles deep tendon reflexes 2+ bilateral. No Babinski or clonus noted bilateral.   Musculoskeletal: No gross boney pedal deformities bilateral. No pain, crepitus, or limitation noted with foot and ankle range of motion bilateral. Muscular strength 5/5 in all groups  tested bilateral.  He has pain on palpation medial calcaneal tubercle of the right heel.  Some tenderness on palpation of the medial gutter of the ankle.  No signs of infection or inflammation in this area.  Gait: Unassisted, Nonantalgic.    Radiographs:  Radiographs taken today demonstrate soft tissue increase in density plantar fascial kidney insertion site of the right heel.  No acute abnormalities.  No fractures identified.  Assessment & Plan:   Assessment: Plantar fasciitis left.  Plan: We  discussed the etiology pathology conservative versus surgical therapies we discussed appropriate shoe gear stretching exercises ice therapy and shoe gear modifications.  I injected 20 mg of Kenalog 5 mg Marcaine point of maximal tenderness of the left heel.  He tolerated procedure well.  Also dispensed a plantar fascial brace to be worn with his tennis shoes.  Also dispensed a prescription for meloxicam 15 mg 1 p.o. daily.     Laurna Shetley T. [place], [place]

## 2019-09-12 ENCOUNTER — Other Ambulatory Visit: Payer: Self-pay

## 2019-09-12 ENCOUNTER — Ambulatory Visit (INDEPENDENT_AMBULATORY_CARE_PROVIDER_SITE_OTHER): Payer: Medicare PPO | Admitting: Podiatry

## 2019-09-12 ENCOUNTER — Encounter: Payer: Self-pay | Admitting: Podiatry

## 2019-09-12 DIAGNOSIS — M722 Plantar fascial fibromatosis: Secondary | ICD-10-CM | POA: Diagnosis not present

## 2019-09-12 NOTE — Progress Notes (Signed)
He presents today for follow-up of his plantar fasciitis states that is about 50% improved continues to wear his plantar fascial brace night splint and take his medication.  He is also wearing his tennis shoes all the time.  Objective: Pulses are palpable.  Still has pain on palpation medial calcaneal tubercle right heel.  Assessment: Plantar fasciitis resolving 50% right.  Plan: At this point recommend he continue all conservative therapies I reinjected the heel today 20 mg Kenalog 5 mg Marcaine follow-up with him in 1 month.

## 2019-10-17 ENCOUNTER — Other Ambulatory Visit: Payer: Self-pay

## 2019-10-17 ENCOUNTER — Encounter: Payer: Self-pay | Admitting: Podiatry

## 2019-10-17 ENCOUNTER — Ambulatory Visit: Payer: Medicare PPO | Admitting: Podiatry

## 2019-10-17 VITALS — Temp 98.1°F

## 2019-10-17 DIAGNOSIS — M722 Plantar fascial fibromatosis: Secondary | ICD-10-CM

## 2019-10-17 NOTE — Progress Notes (Signed)
He presents today states that is doing a little bit better Monday seem to be really bad but today feels much better as did yesterday.  He is referring to plantar fasciitis of his right foot.  Objective: Vital signs are stable he is alert and oriented x3 he states that he is about 75 to 80% improved.  He states that maybe 1 more shot I will do it.  He has pain on palpation mid calcaneal tubercle of the right heel l.  Assessment: Plan fasciitis right heel.  Plan: Reinjected the area today continue the use of the tennis shoes even in the house and continue oral anti-inflammatories.

## 2019-11-28 ENCOUNTER — Other Ambulatory Visit: Payer: Self-pay

## 2019-11-28 ENCOUNTER — Ambulatory Visit: Payer: Medicare PPO | Admitting: Podiatry

## 2019-11-28 ENCOUNTER — Encounter: Payer: Self-pay | Admitting: Podiatry

## 2019-11-28 DIAGNOSIS — M722 Plantar fascial fibromatosis: Secondary | ICD-10-CM

## 2019-11-28 NOTE — Progress Notes (Signed)
He presents today for follow-up of his right heel states the pain is doing better he rates it today at 90 to 95% improved.  Objective: Vital signs are stable alert oriented x3.  Pain on palpation mid calcaneal tubercle of the right heel much decreased from previous evaluations.  Assessment: Some residual plantar fasciitis medial tubercle at the plantar calcaneal insertion site.  Plan: Alejandro Randolph ahead and injected the area today with his fourth dose of Kenalog and local anesthetic.  Follow-up with him as needed.

## 2020-08-12 ENCOUNTER — Other Ambulatory Visit: Payer: Self-pay | Admitting: Ophthalmology

## 2020-08-12 ENCOUNTER — Other Ambulatory Visit
Admission: RE | Admit: 2020-08-12 | Discharge: 2020-08-12 | Disposition: A | Discharge status: Home / Self Care | Payer: Medicare PPO | Source: Ambulatory Visit | Attending: Ophthalmology | Admitting: Ophthalmology

## 2020-08-12 ENCOUNTER — Other Ambulatory Visit: Payer: Self-pay

## 2020-08-12 DIAGNOSIS — H4710 Unspecified papilledema: Secondary | ICD-10-CM | POA: Insufficient documentation

## 2020-08-12 LAB — CBC WITH DIFFERENTIAL/PLATELET
Abs Immature Granulocytes: 0.03 10*3/uL (ref 0.00–0.07)
Basophils Absolute: 0.1 10*3/uL (ref 0.0–0.1)
Basophils Relative: 1 %
Eosinophils Absolute: 0 10*3/uL (ref 0.0–0.5)
Eosinophils Relative: 0 %
HCT: 42.8 % (ref 39.0–52.0)
Hemoglobin: 14.5 g/dL (ref 13.0–17.0)
Immature Granulocytes: 0 %
Lymphocytes Relative: 13 %
Lymphs Abs: 1.4 10*3/uL (ref 0.7–4.0)
MCH: 31.5 pg (ref 26.0–34.0)
MCHC: 33.9 g/dL (ref 30.0–36.0)
MCV: 93 fL (ref 80.0–100.0)
Monocytes Absolute: 1.1 10*3/uL — ABNORMAL HIGH (ref 0.1–1.0)
Monocytes Relative: 10 %
Neutro Abs: 8.3 10*3/uL — ABNORMAL HIGH (ref 1.7–7.7)
Neutrophils Relative %: 76 %
Platelets: 274 10*3/uL (ref 150–400)
RBC: 4.6 MIL/uL (ref 4.22–5.81)
RDW: 13.4 % (ref 11.5–15.5)
WBC: 10.9 10*3/uL — ABNORMAL HIGH (ref 4.0–10.5)
nRBC: 0 % (ref 0.0–0.2)

## 2020-08-12 LAB — SEDIMENTATION RATE: Sed Rate: 8 mm/hr (ref 0–16)

## 2020-08-12 LAB — C-REACTIVE PROTEIN: CRP: 0.6 mg/dL (ref <1.0)

## 2020-08-18 ENCOUNTER — Other Ambulatory Visit: Payer: Self-pay | Admitting: Ophthalmology

## 2020-08-18 ENCOUNTER — Other Ambulatory Visit: Payer: Self-pay

## 2020-08-18 ENCOUNTER — Ambulatory Visit
Admission: RE | Admit: 2020-08-18 | Discharge: 2020-08-18 | Disposition: A | Discharge status: Home / Self Care | Payer: Medicare PPO | Source: Ambulatory Visit | Attending: Ophthalmology | Admitting: Ophthalmology

## 2020-08-18 DIAGNOSIS — H4710 Unspecified papilledema: Secondary | ICD-10-CM

## 2020-08-18 IMAGING — MR MR HEAD WO/W CM
18 of 21 series · 39 of 48 positions shown · IV contrast (gadavist)
Comparison: None.

CLINICAL DATA: Optic nerve edema.

EXAM:
MRI HEAD AND ORBITS WITHOUT AND WITH CONTRAST
TECHNIQUE: Multiplanar, multiecho pulse sequences of the brain and surrounding
structures were obtained without and with intravenous contrast.
Multiplanar, multiecho pulse sequences of the orbits and surrounding
structures were obtained including fat saturation techniques, before
and after intravenous contrast administration.
CONTRAST:  9mL GADAVIST GADOBUTROL 1 MMOL/ML IV SOLN

[Series 5: T1 · sagittal · 3.0mm · 0.38mm/px · 1 of 36 slices shown (1 of 5)]
[im 1/36]
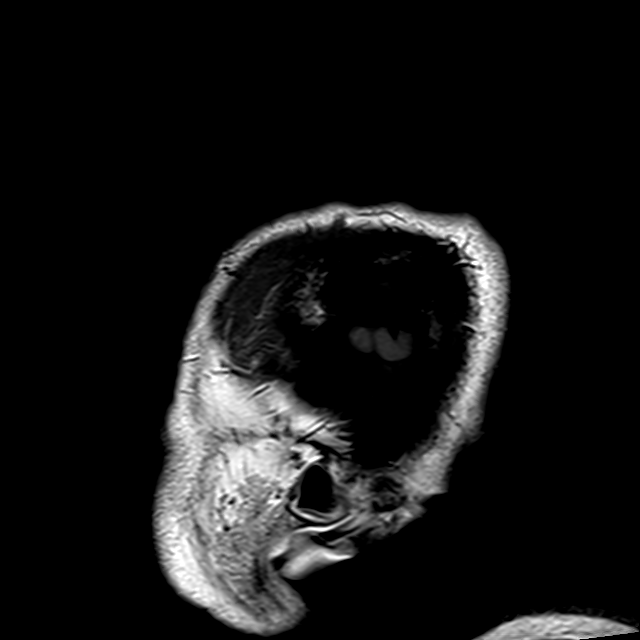

[Series 7: T2 · axial · 3.0mm · 0.78mm/px · 1 of 17 slices shown (1 of 3)]
[im 1/17]
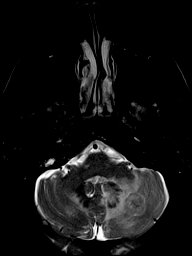

[Series 9: T2 · coronal · 3.0mm · 0.78mm/px · 2 of 35 slices shown (2 of 3)]
[im 1/35]
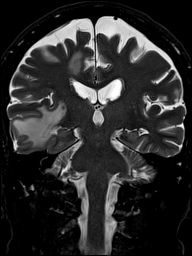
[im 35/35]
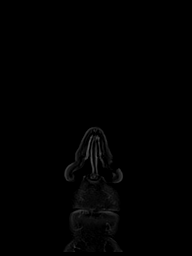

[Series 10: T1 · axial · non-contrast · 3.0mm · 0.31mm/px · 1 of 23 slices shown (2 of 5)]
[im 1/23]
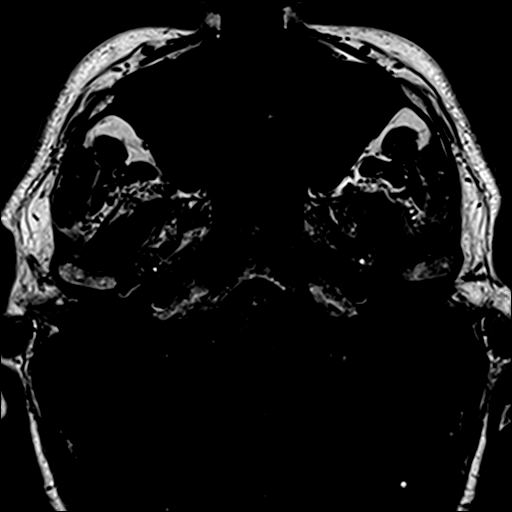

[Series 11: T1 · coronal · non-contrast · 3.0mm · 0.35mm/px · 2 of 40 slices shown (3 of 5)]
[im 1/40]
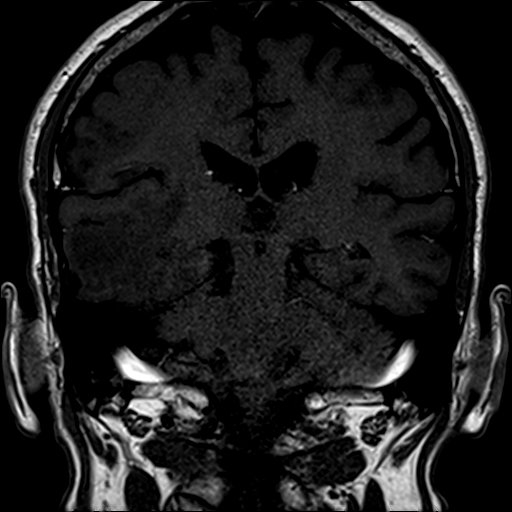
[im 40/40]
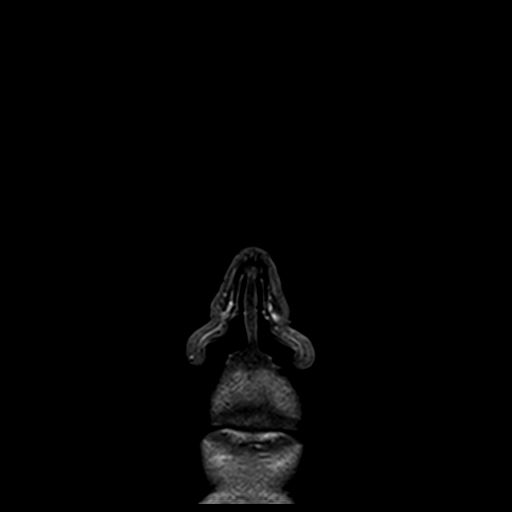

[Series 12: ax dwi_tracew · axial · 3.0mm · 0.60mm/px · z∈[-49,+91]mm · 2 of 48 slices shown]
[im 1/48]
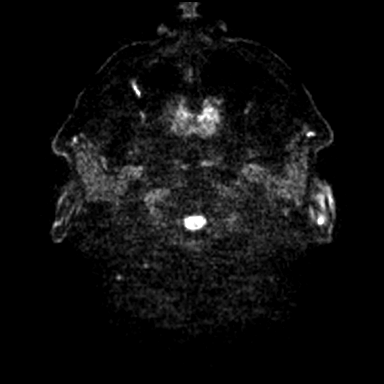
[im 48/48]
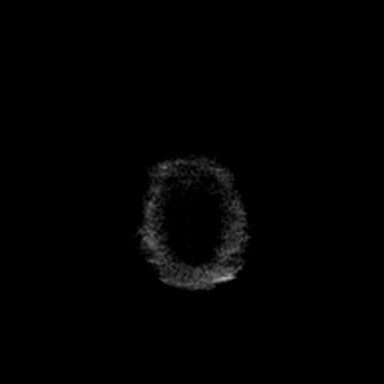

[Series 13: ax dwi_adc · axial · 3.0mm · 0.60mm/px · z∈[-49,+91]mm · 2 of 48 slices shown]
[im 1/48]
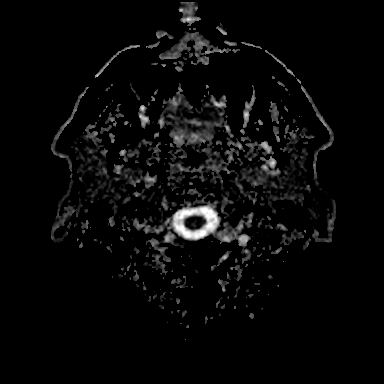
[im 48/48]
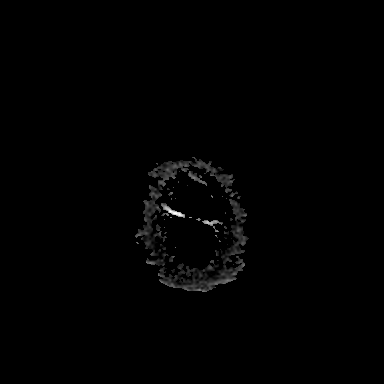

[Series 14: cor dwi_tracew · coronal · 5.0mm · 0.60mm/px · 1 of 40 slices shown]
[im 1/40]
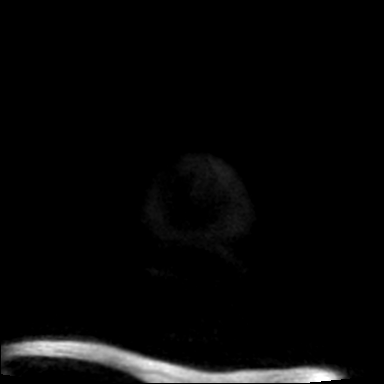

[Series 16: T1 · sagittal · 5.0mm · 0.62mm/px · 1 of 25 slices shown (4 of 5)]
[im 1/25]
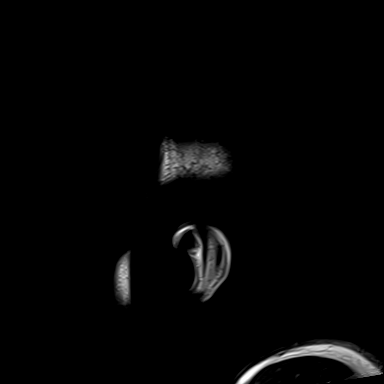

[Series 17: T2 · axial · 5.0mm · 0.53mm/px · 1 of 27 slices shown (3 of 3)]
[im 1/27]
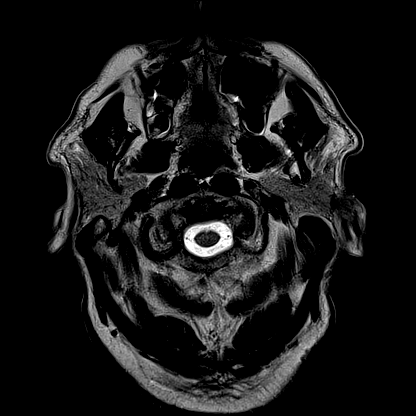

[Series 22: FLAIR · axial · 3.0mm · 0.53mm/px · z∈[-57,+89]mm · 3 of 55 slices shown]
[im 1/55]
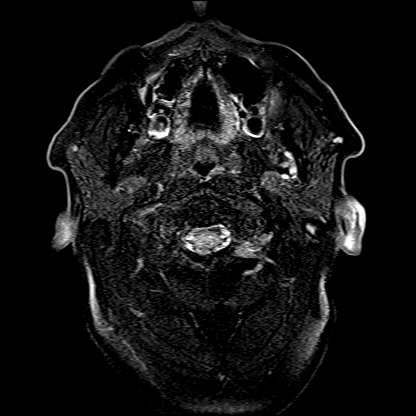
[im 28/55]
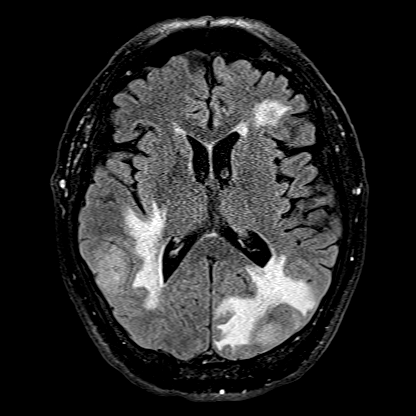
[im 55/55]
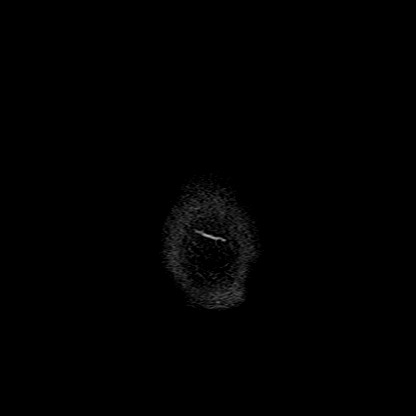

[Series 23: T1 · axial · 1.0mm · 0.98mm/px · z∈[-56,+101]mm · 8 of 176 slices shown (5 of 5)]
[im 1/176]
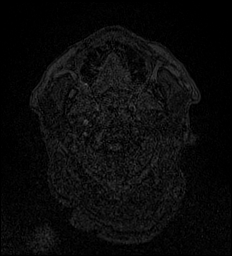
[im 26/176]
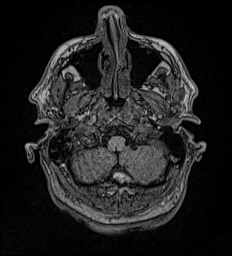
[im 51/176]
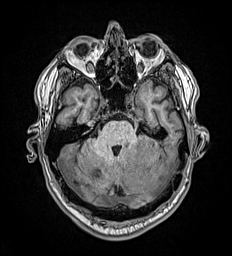
[im 76/176]
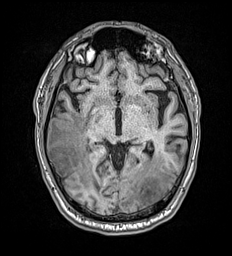
[im 101/176]
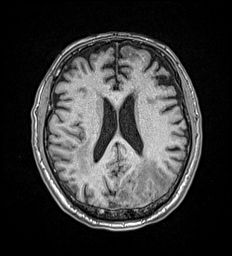
[im 126/176]
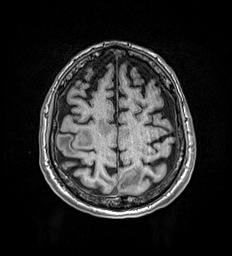
[im 151/176]
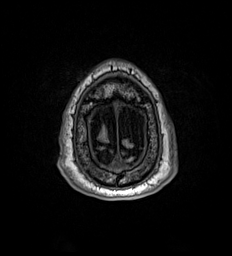
[im 176/176]
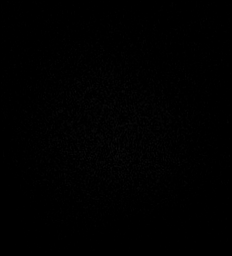

[Series 25: T1 fat-sat · coronal · 3.0mm · 0.70mm/px · 2 of 40 slices shown (1 of 2)]
[im 1/40]
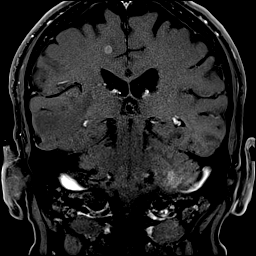
[im 40/40]
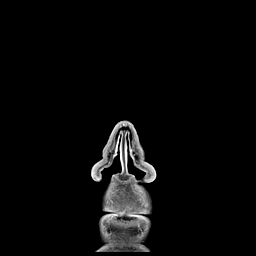

[Series 27: T1 fat-sat · axial · 3.0mm · 0.62mm/px · 1 of 23 slices shown (2 of 2)]
[im 1/23]
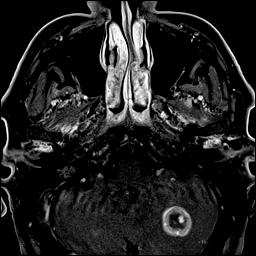

[Series 28: T2 post-contrast · coronal · 5.0mm · 0.57mm/px · 1 of 29 slices shown]
[im 1/29]
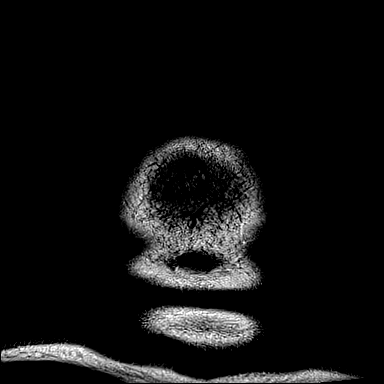

[Series 29: T1 post-contrast · axial · 1.0mm · 0.98mm/px · z∈[-56,+101]mm · 8 of 176 slices shown (1 of 3)]
[im 1/176]
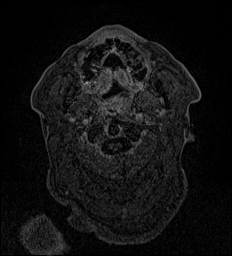
[im 26/176]
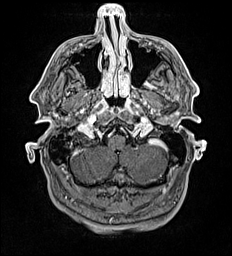
[im 51/176]
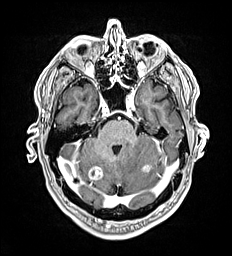
[im 76/176]
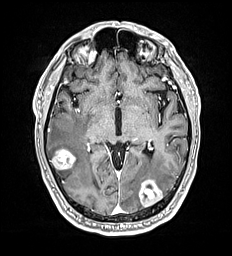
[im 101/176]
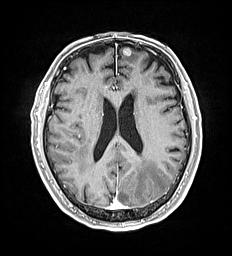
[im 126/176]
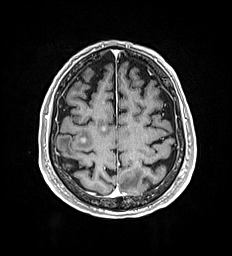
[im 151/176]
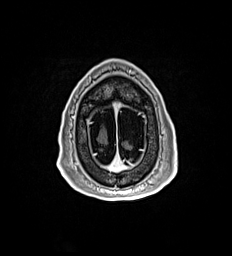
[im 176/176]
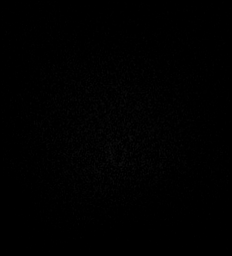

[Series 30: T1 post-contrast · coronal · 5.0mm · 0.57mm/px · 1 of 29 slices shown (2 of 3)]
[im 1/29]
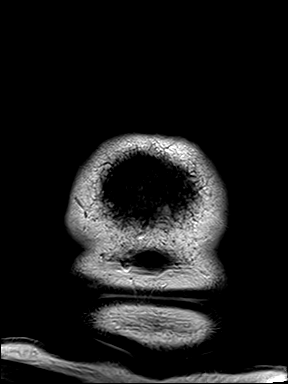

[Series 31: T1 post-contrast · sagittal · 5.0mm · 0.62mm/px · 1 of 25 slices shown (3 of 3)]
[im 1/25]
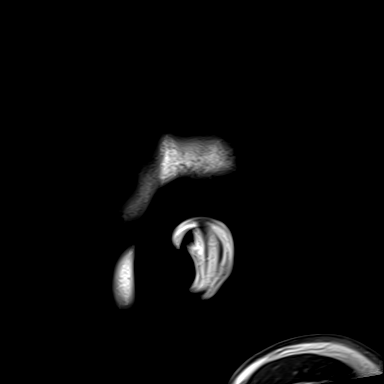

[39 of 48 positions shown; findings below may reference images not displayed]

FINDINGS: MRI HEAD FINDINGS

Brain: Multiple enhancing lesions are seen scattered throughout the
bilateral cerebellar and cerebral hemispheres, concerning for
metastatic disease. Lesions show a peripheral rim enhancement, some
of them with a target appearance. At least 16 lesions are
identified. The largest lesions are seen in the left cerebellar
hemisphere (22 mm), right temporal lobe (26 mm) and left occipital
lobe (29 mm) with prominent surrounding vasogenic edema causing
effacement of the adjacent cerebral sulci.

No hydrocephalus, hemorrhage, acute infarct or midline shift.

Vascular: Normal flow voids.

Skull and upper cervical spine: Normal marrow signal.

Other: Right mastoid effusion.

MRI ORBITS FINDINGS

Orbits: Bilateral lens surgery. Left scleral buckle surgery.
Flattening of the bilateral optic disc, right greater than, likely
related to increased intracranial pressure. Optic nerves are
otherwise maintained. Orbital fat, extra ocular muscles and vascular
structures appear preserved.

Optic chiasm, cavernous sinus and suprasellar cistern are
unremarkable.

Visualized sinuses: Trace mucosal thickening scattered throughout
the paranasal sinuses

Soft tissues: Negative.
IMPRESSION: 1. Multiple enhancing lesions scattered throughout the bilateral
cerebellar and cerebral hemispheres, concerning for metastatic
disease. The largest lesions are seen in the left cerebellar
hemisphere (22 mm), right temporal lobe (26 mm) and left occipital
lobe (29 mm) with prominent surrounding vasogenic edema causing
effacement of the adjacent cerebral sulci.
2. Flattening of the bilateral optic disc, right greater than left,
likely related to increased intracranial pressure.
3. Right mastoid effusion.

These results will be called to the ordering clinician or
representative by the Radiologist Assistant, and communication
documented in the PACS or [REDACTED].

## 2020-08-18 MED ORDER — GADOBUTROL 1 MMOL/ML IV SOLN
9.0000 mL | Freq: Once | INTRAVENOUS | Status: AC | PRN
  Administered 2020-08-18: 9 mL via INTRAVENOUS

## 2020-08-21 ENCOUNTER — Ambulatory Visit
Admission: RE | Admit: 2020-08-21 | Discharge: 2020-08-21 | Disposition: A | Discharge status: Home / Self Care | Payer: Medicare PPO | Source: Ambulatory Visit | Attending: Internal Medicine | Admitting: Internal Medicine

## 2020-08-21 ENCOUNTER — Encounter (INDEPENDENT_AMBULATORY_CARE_PROVIDER_SITE_OTHER): Payer: Self-pay

## 2020-08-21 ENCOUNTER — Inpatient Hospital Stay: Payer: Medicare PPO

## 2020-08-21 ENCOUNTER — Other Ambulatory Visit: Payer: Self-pay

## 2020-08-21 ENCOUNTER — Encounter: Payer: Self-pay | Admitting: Internal Medicine

## 2020-08-21 ENCOUNTER — Inpatient Hospital Stay: Payer: Medicare PPO | Attending: Internal Medicine | Admitting: Internal Medicine

## 2020-08-21 DIAGNOSIS — D414 Neoplasm of uncertain behavior of bladder: Secondary | ICD-10-CM | POA: Diagnosis not present

## 2020-08-21 DIAGNOSIS — R970 Elevated carcinoembryonic antigen [CEA]: Secondary | ICD-10-CM | POA: Insufficient documentation

## 2020-08-21 DIAGNOSIS — C7931 Secondary malignant neoplasm of brain: Secondary | ICD-10-CM | POA: Diagnosis not present

## 2020-08-21 DIAGNOSIS — Z79899 Other long term (current) drug therapy: Secondary | ICD-10-CM | POA: Diagnosis not present

## 2020-08-21 DIAGNOSIS — I10 Essential (primary) hypertension: Secondary | ICD-10-CM | POA: Diagnosis not present

## 2020-08-21 DIAGNOSIS — R590 Localized enlarged lymph nodes: Secondary | ICD-10-CM | POA: Diagnosis not present

## 2020-08-21 DIAGNOSIS — K219 Gastro-esophageal reflux disease without esophagitis: Secondary | ICD-10-CM | POA: Insufficient documentation

## 2020-08-21 DIAGNOSIS — E7800 Pure hypercholesterolemia, unspecified: Secondary | ICD-10-CM | POA: Insufficient documentation

## 2020-08-21 DIAGNOSIS — D374 Neoplasm of uncertain behavior of colon: Secondary | ICD-10-CM | POA: Diagnosis not present

## 2020-08-21 DIAGNOSIS — Z7984 Long term (current) use of oral hypoglycemic drugs: Secondary | ICD-10-CM | POA: Insufficient documentation

## 2020-08-21 DIAGNOSIS — D432 Neoplasm of uncertain behavior of brain, unspecified: Secondary | ICD-10-CM | POA: Diagnosis present

## 2020-08-21 DIAGNOSIS — Z7982 Long term (current) use of aspirin: Secondary | ICD-10-CM | POA: Diagnosis not present

## 2020-08-21 DIAGNOSIS — R911 Solitary pulmonary nodule: Secondary | ICD-10-CM | POA: Insufficient documentation

## 2020-08-21 DIAGNOSIS — E119 Type 2 diabetes mellitus without complications: Secondary | ICD-10-CM | POA: Diagnosis not present

## 2020-08-21 DIAGNOSIS — Z87891 Personal history of nicotine dependence: Secondary | ICD-10-CM | POA: Diagnosis not present

## 2020-08-21 LAB — CBC WITH DIFFERENTIAL/PLATELET
Abs Immature Granulocytes: 0.02 10*3/uL (ref 0.00–0.07)
Basophils Absolute: 0.1 10*3/uL (ref 0.0–0.1)
Basophils Relative: 1 %
Eosinophils Absolute: 0.3 10*3/uL (ref 0.0–0.5)
Eosinophils Relative: 4 %
HCT: 41.7 % (ref 39.0–52.0)
Hemoglobin: 14.4 g/dL (ref 13.0–17.0)
Immature Granulocytes: 0 %
Lymphocytes Relative: 18 %
Lymphs Abs: 1.5 10*3/uL (ref 0.7–4.0)
MCH: 31.7 pg (ref 26.0–34.0)
MCHC: 34.5 g/dL (ref 30.0–36.0)
MCV: 91.9 fL (ref 80.0–100.0)
Monocytes Absolute: 0.9 10*3/uL (ref 0.1–1.0)
Monocytes Relative: 11 %
Neutro Abs: 5.6 10*3/uL (ref 1.7–7.7)
Neutrophils Relative %: 66 %
Platelets: 259 10*3/uL (ref 150–400)
RBC: 4.54 MIL/uL (ref 4.22–5.81)
RDW: 13.5 % (ref 11.5–15.5)
WBC: 8.4 10*3/uL (ref 4.0–10.5)
nRBC: 0 % (ref 0.0–0.2)

## 2020-08-21 LAB — COMPREHENSIVE METABOLIC PANEL
ALT: 30 U/L (ref 0–44)
AST: 29 U/L (ref 15–41)
Albumin: 3.8 g/dL (ref 3.5–5.0)
Alkaline Phosphatase: 45 U/L (ref 38–126)
Anion gap: 9 (ref 5–15)
BUN: 18 mg/dL (ref 8–23)
CO2: 25 mmol/L (ref 22–32)
Calcium: 10 mg/dL (ref 8.9–10.3)
Chloride: 103 mmol/L (ref 98–111)
Creatinine, Ser: 1.41 mg/dL — ABNORMAL HIGH (ref 0.61–1.24)
GFR, Estimated: 53 mL/min — ABNORMAL LOW (ref >60)
Glucose, Bld: 171 mg/dL — ABNORMAL HIGH (ref 70–99)
Potassium: 3.8 mmol/L (ref 3.5–5.1)
Sodium: 137 mmol/L (ref 135–145)
Total Bilirubin: 0.7 mg/dL (ref 0.3–1.2)
Total Protein: 7.2 g/dL (ref 6.5–8.1)

## 2020-08-21 LAB — LACTATE DEHYDROGENASE: LDH: 107 U/L (ref 98–192)

## 2020-08-21 IMAGING — CT CT CHEST-ABD-PELV W/ CM
2 of 5 series · 12 of 36 positions shown, 14 images · IV contrast (omnipaque)
Comparison: None.
COMPARISON: None.

Addendum:
CLINICAL DATA: Brain lesions.  Concern for brain metastasis.

EXAM:
CT CHEST, ABDOMEN, AND PELVIS WITH CONTRAST
TECHNIQUE: Multidetector CT imaging of the chest, abdomen and pelvis was
performed following the standard protocol during bolus
administration of intravenous contrast.
CONTRAST:  100mL OMNIPAQUE IOHEXOL 300 MG/ML  SOLN

[Series 2: cap with · axial · 0.87mm/px · z∈[-934,-419]mm · 9 of 129 slices shown, 11 images]
[im 13/129  mediastinal]
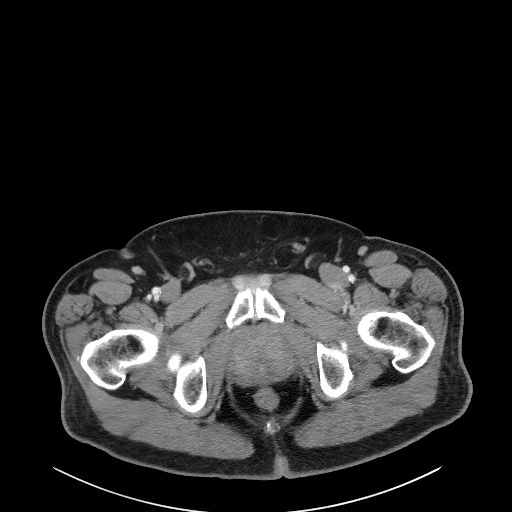
[im 13/129  bone]
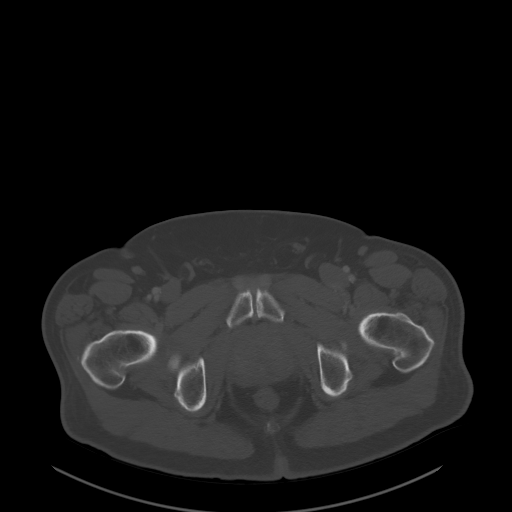
[im 26/129  mediastinal]
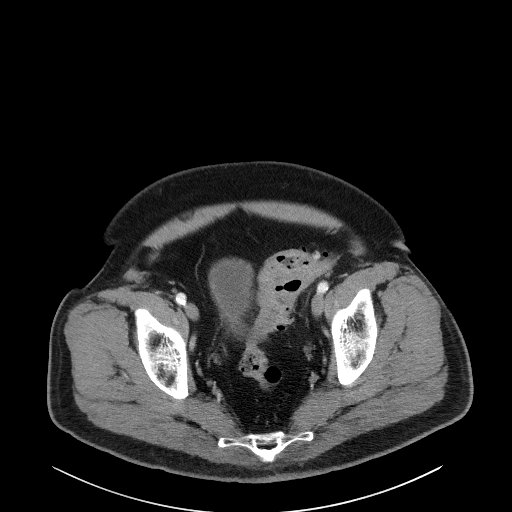
[im 39/129  mediastinal]
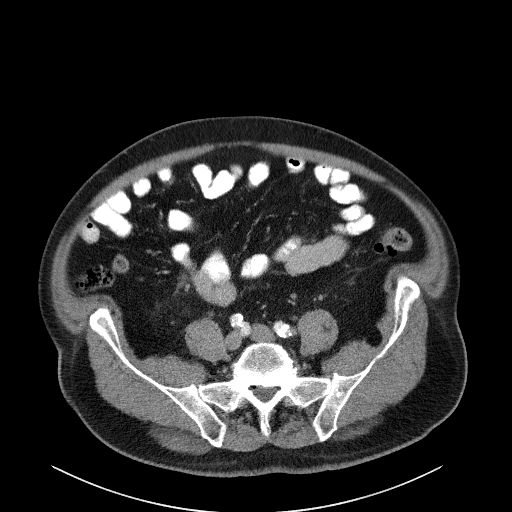
[im 52/129  mediastinal]
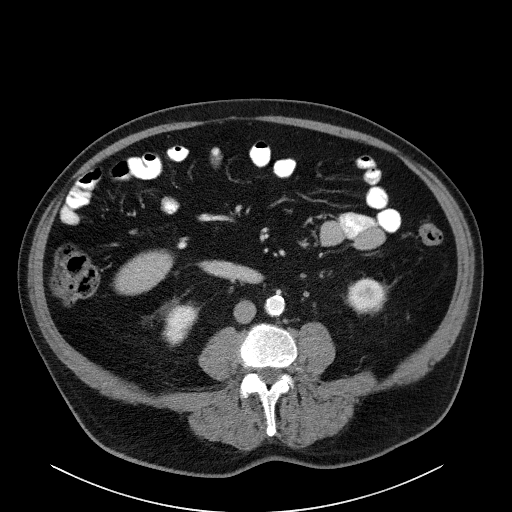
[im 65/129  mediastinal]
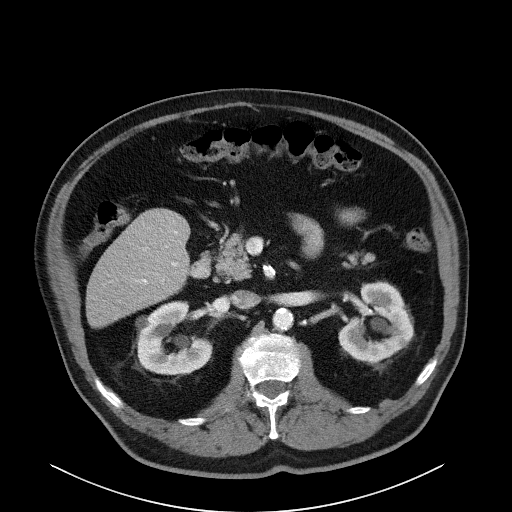
[im 77/129  mediastinal]
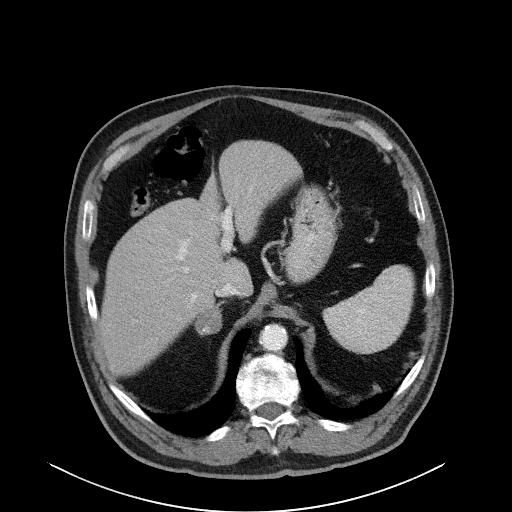
[im 90/129  mediastinal]
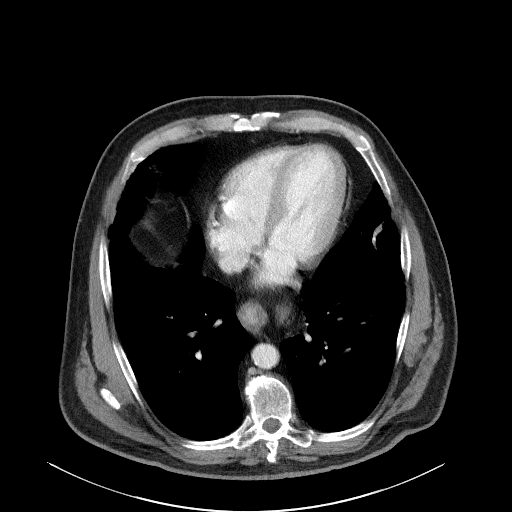
[im 103/129  mediastinal]
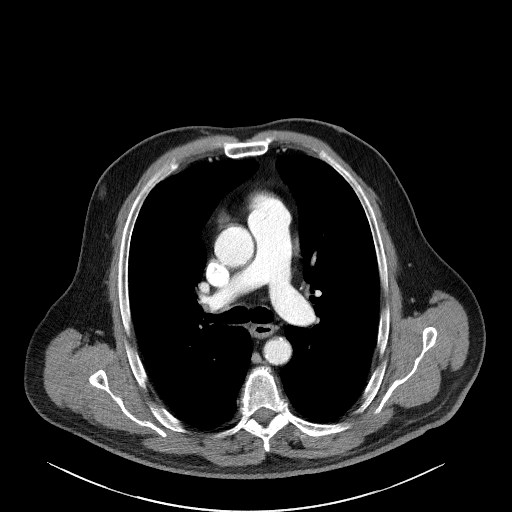
[im 116/129  mediastinal]
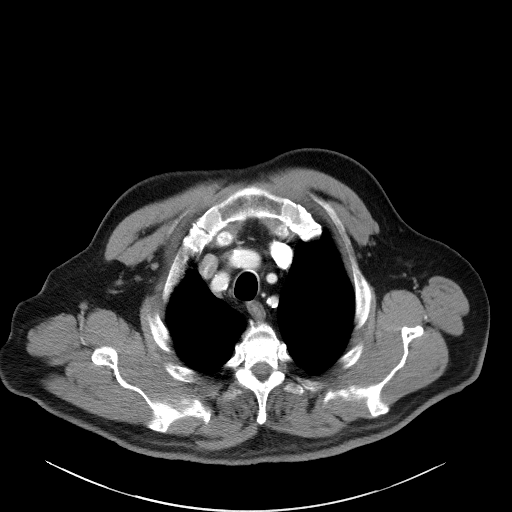
[im 116/129  bone]
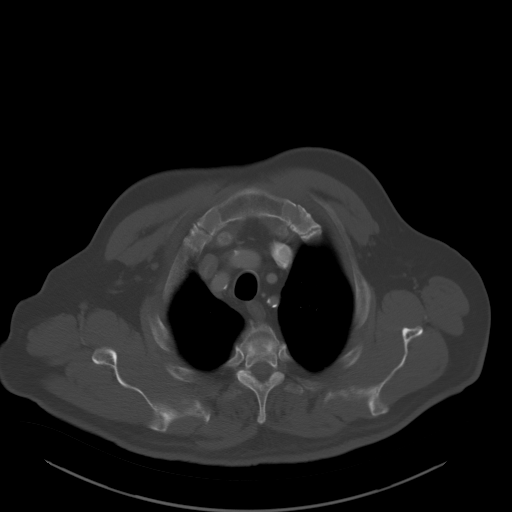

[Series 5: coronals · coronal · 0.81mm/px · 3 of 176 slices shown]
[im 36/176  mediastinal]
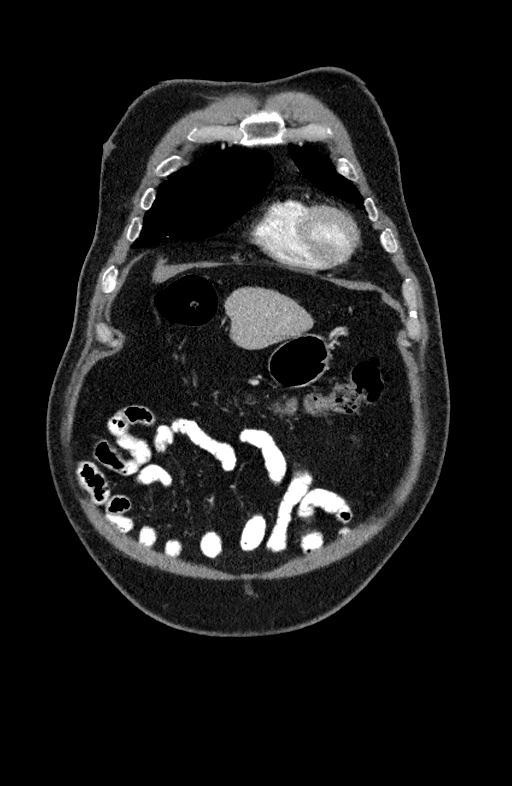
[im 71/176  mediastinal]
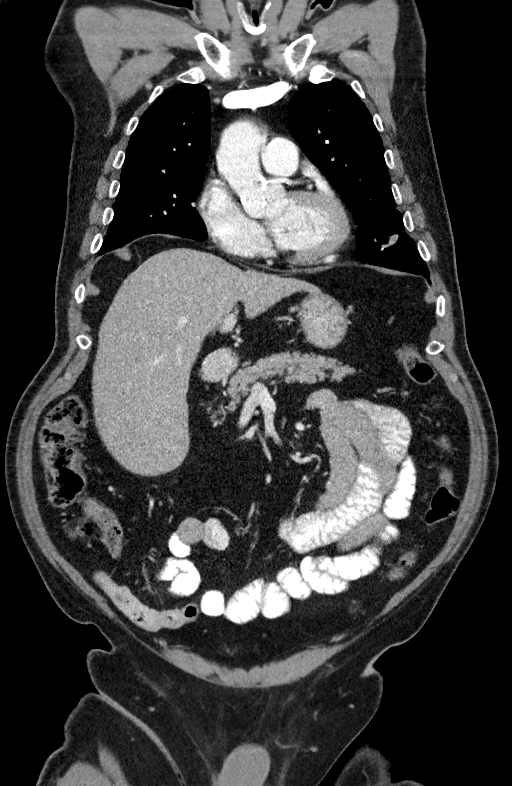
[im 106/176  mediastinal]
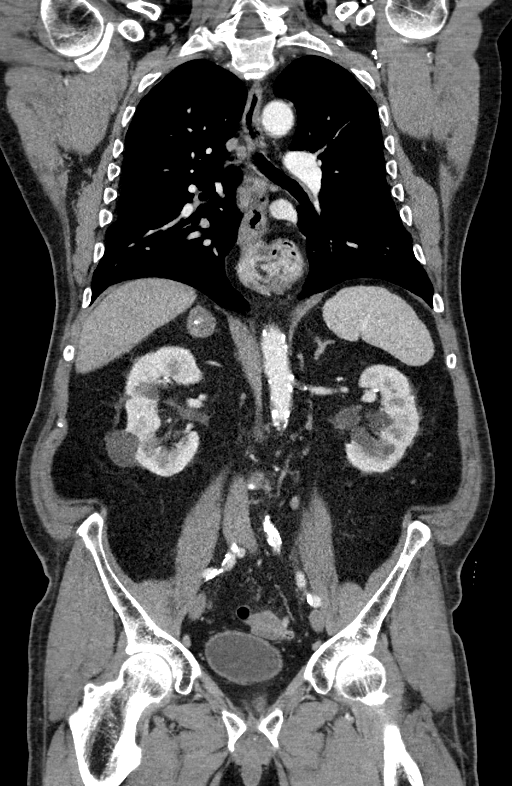

[12 of 36 positions shown; findings below may reference images not displayed]

FINDINGS: CT CHEST FINDINGS

Cardiovascular: No significant vascular findings. Normal heart size.
No pericardial effusion.

Mediastinum/Nodes: Enlarged RIGHT paratracheal node measures 15 mm.
Enlarged subcarinal node measures 11 mm.

Lungs/Pleura: 7 mm nodule in the RIGHT lower lobe (image 56/4).

Musculoskeletal: No aggressive osseous lesion.

CT ABDOMEN AND PELVIS FINDINGS

Hepatobiliary: No focal hepatic lesion. Postcholecystectomy. No
biliary dilatation.

Pancreas: Pancreas is normal. No ductal dilatation. No pancreatic
inflammation.

Spleen: Normal spleen

Adrenals/urinary tract: Rounded nodule of the RIGHT adrenal gland
measures 2.4 cm. LEFT adrenal gland normal. No enhancing renal
cortical lesion. Bilateral simple fluid attenuation renal cysts.
Extrarenal pelvis on the LEFT.

Enhancing focus of flattened enhancing tissue posterior LEFT aspect
bladder measures 13 mm by 4 mm (image [DATE])

Stomach/Bowel: Large hiatal hernia. Small bowel normal. Cecum
normal. Appendix not identified. The colon and rectosigmoid colon
are normal.

Vascular/Lymphatic: Abdominal aorta is normal in caliber with
intimal calcification.

Rounded node in the retroperitoneum adjacent to the LEFT ureter
measures 6 mm (image 83/2. Small the RIGHT common iliac node
measures 6 mm on image 89/2. Small LEFT external iliac lymph node
measures 7 mm image 22.

Reproductive: Prostate enlarged to 16 mm

Other: There is peritoneal metastasis.

Musculoskeletal: No aggressive osseous lesion.
IMPRESSION: Chest Impression:

1. Single pulmonary nodule in the RIGHT lower lobe is concerning for
solitary pulmonary metastasis.
2. Metastatic mediastinal lymphadenopathy.

Abdomen / Pelvis Impression:

1. Flattened enhancing lesion in the posterior LEFT bladder is
concerning for bladder neoplasm.
2. Several subcentimeter rounded lymph nodes in the LEFT
retroperitoneum and along the iliac vessels are not pathologic by
size criteria but concerning for nodal metastasis.
3. Enlarged RIGHT adrenal gland concerning for RIGHT adrenal
metastasis.

ADDENDUM:
A comparison CT from [DATE] was made available. The RIGHT
adrenal gland lesion is stable. Additionally the lesion had
macroscopic fat on comparison exam typical of a benign adrenal
myelolipoma.

Additionally, there is nodularity within the bladder on comparison
CT from [S8]. This lowers the suspicion bladder neoplasm but does
not exclude neoplasm.

Additionally, the retroperitoneal nodes described on current CT or
are also stable compared to [DATE].

The patient may benefit from outpatient FDG PET scan to identify
source of presumed brain metastasis.

*** End of Addendum ***
FINDINGS: CT CHEST FINDINGS

Cardiovascular: No significant vascular findings. Normal heart size.
No pericardial effusion.

Mediastinum/Nodes: Enlarged RIGHT paratracheal node measures 15 mm.
Enlarged subcarinal node measures 11 mm.

Lungs/Pleura: 7 mm nodule in the RIGHT lower lobe (image 56/4).

Musculoskeletal: No aggressive osseous lesion.

CT ABDOMEN AND PELVIS FINDINGS

Hepatobiliary: No focal hepatic lesion. Postcholecystectomy. No
biliary dilatation.

Pancreas: Pancreas is normal. No ductal dilatation. No pancreatic
inflammation.

Spleen: Normal spleen

Adrenals/urinary tract: Rounded nodule of the RIGHT adrenal gland
measures 2.4 cm. LEFT adrenal gland normal. No enhancing renal
cortical lesion. Bilateral simple fluid attenuation renal cysts.
Extrarenal pelvis on the LEFT.

Enhancing focus of flattened enhancing tissue posterior LEFT aspect
bladder measures 13 mm by 4 mm (image [DATE])

Stomach/Bowel: Large hiatal hernia. Small bowel normal. Cecum
normal. Appendix not identified. The colon and rectosigmoid colon
are normal.

Vascular/Lymphatic: Abdominal aorta is normal in caliber with
intimal calcification.

Rounded node in the retroperitoneum adjacent to the LEFT ureter
measures 6 mm (image 83/2. Small the RIGHT common iliac node
measures 6 mm on image 89/2. Small LEFT external iliac lymph node
measures 7 mm image 22.

Reproductive: Prostate enlarged to 16 mm

Other: There is peritoneal metastasis.

Musculoskeletal: No aggressive osseous lesion.
IMPRESSION: Chest Impression:

1. Single pulmonary nodule in the RIGHT lower lobe is concerning for
solitary pulmonary metastasis.
2. Metastatic mediastinal lymphadenopathy.

Abdomen / Pelvis Impression:

1. Flattened enhancing lesion in the posterior LEFT bladder is
concerning for bladder neoplasm.
2. Several subcentimeter rounded lymph nodes in the LEFT
retroperitoneum and along the iliac vessels are not pathologic by
size criteria but concerning for nodal metastasis.
3. Enlarged RIGHT adrenal gland concerning for RIGHT adrenal
metastasis.

## 2020-08-21 MED ORDER — LEVETIRACETAM 500 MG PO TABS: 500.0000 mg | ORAL_TABLET | Freq: Two times a day (BID) | ORAL | 0 refills | Status: DC

## 2020-08-21 MED ORDER — DEXAMETHASONE 2 MG PO TABS: 2.0000 mg | ORAL_TABLET | Freq: Two times a day (BID) | ORAL | 0 refills | Status: DC

## 2020-08-21 MED ORDER — IOHEXOL 300 MG/ML  SOLN
100.0000 mL | Freq: Once | INTRAMUSCULAR | Status: AC | PRN
  Administered 2020-08-21: 100 mL via INTRAVENOUS

## 2020-08-21 NOTE — Progress Notes (Signed)
Rn Assisted patient to activate mychart today. Patient education provided for mychart.

## 2020-08-21 NOTE — Progress Notes (Signed)
[place] NOTE  Patient Care Team: [place] Pink, MD as PCP - General (Family Medicine)  CHIEF COMPLAINTS/PURPOSE OF CONSULTATION: Brain lesions    Oncology History Overview Note  # JAN 17th, 2022-Headaches/Vomitting [Dr.Brasington; ophthalmology brain MRI]-multiple enhancing lesions the brain- bilateral cerebellar and cerebral hemispheres, concerning for metastatic disease. The largest lesions are seen in the left cerebellar hemisphere (22 mm), right temporal lobe (26 mm) and left occipital lobe (29 mm) with prominent surrounding vasogenic edema causing effacement of the adjacent cerebral sulci. 2. Flattening of the bilateral optic disc, right greater than left, likely related to increased intracranial pressure. Right mastoid effusion.  # PVD [Dr.Schneir s/p stenting]     Brain metastasis ([place])  08/21/2020 Initial Diagnosis   Brain metastasis ([facility])     HISTORY OF PRESENTING ILLNESS:  Alejandro Randolph 75 y.o.  male patient former smoker has been referred to [place] for further evaluation recommendation for multiple brain lesions.  Patient noted to have headache approximately a month ago.  Also had episodes of nausea and vomiting x1 approximately 3 weeks ago.  He was initially evaluated PCP; and then referred to ophthalmology.  On further evaluation with ophthalmology-given the concerns of intracranial pressure patient had MRI of the brain and orbits.  Unfortunately MRI shows-multiple brain lesions again concerning for metastatic malignancy.  Interestingly patient denies any worsening headaches at this time.  Denies any nausea vomiting.  Denies any vision issues.  No falls.   Denies any fevers or chills.  Denies any exposure to cats.   Review of Systems  Constitutional: Negative for chills, diaphoresis, fever, malaise/fatigue and weight loss.  HENT: Negative for nosebleeds and sore throat.   Eyes: Negative for double vision.  Respiratory: Positive for shortness  of breath. Negative for cough, hemoptysis and wheezing.   Cardiovascular: Negative for chest pain, palpitations, orthopnea and leg swelling.  Gastrointestinal: Negative for abdominal pain, blood in stool, constipation, diarrhea, heartburn, melena, nausea and vomiting.  Genitourinary: Negative for dysuria, frequency and urgency.  Musculoskeletal: Positive for back pain. Negative for joint pain.  Skin: Negative.  Negative for itching and rash.  Neurological: Positive for headaches. Negative for dizziness, tingling, focal weakness and weakness.  Endo/Heme/Allergies: Does not bruise/bleed easily.  Psychiatric/Behavioral: Negative for depression. The patient is not nervous/anxious and does not have insomnia.      MEDICAL HISTORY:  Past Medical History:  Diagnosis Date  . Abscess of sigmoid colon   . Diabetes mellitus without complication ([place])   . GERD (gastroesophageal reflux disease)   . Hypercholesteremia   . Hypertension     SURGICAL HISTORY: Past Surgical History:  Procedure Laterality Date  . CHOLECYSTECTOMY    . COLONOSCOPY WITH PROPOFOL N/A 07/19/2016   Procedure: COLONOSCOPY WITH PROPOFOL;  Surgeon: Jonathon Bellows, MD;  Location: [facility] OR[facility];  Service: Endoscopy;  Laterality: N/A;  . COLONOSCOPY WITH PROPOFOL N/A 09/02/2016   Procedure: COLONOSCOPY WITH PROPOFOL;  Surgeon: Jonathon Bellows, MD;  Location: [facility] END[facility];  Service: Endoscopy;  Laterality: N/A;  . femoral stents    . FLEXIBLE SIGMOIDOSCOPY  07/19/2016   Procedure: FLEXIBLE SIGMOIDOSCOPY;  Surgeon: Jonathon Bellows, MD;  Location: [facility] ORS;  Service: Endoscopy;;  . HERNIA REPAIR    . RETINAL DETACHMENT SURGERY    . TONSILLECTOMY      SOCIAL HISTORY: Social History   Socioeconomic History  . Marital status: Married    Spouse name: Not on file  . Number of children: Not on file  . Years of education: Not  on file  . Highest education level: Not on file  Occupational History  . Not on file  Tobacco Use  . Smoking status:  Former Research scientist (life sciences)  . Smokeless tobacco: Never Used  Substance and Sexual Activity  . Alcohol use: No  . Drug use: No  . Sexual activity: Not on file  Other Topics Concern  . Not on file  Social History Narrative   Lives in [place]; lives with wife; 1 son- lives close by.[id number]- Quit smoking- 2001. No Alcohol.    Social Determinants of Health   Financial Resource Strain: Not on file  Food Insecurity: Not on file  Transportation Needs: Not on file  Physical Activity: Not on file  Stress: Not on file  Social Connections: Not on file  Intimate Partner Violence: Not on file    FAMILY HISTORY: Family History  Problem Relation Age of Onset  . Diabetes Mother   . Hypertension Mother   . Diabetes Father     ALLERGIES:  is allergic to atorvastatin.  MEDICATIONS:  Current Outpatient Medications  Medication Sig Dispense Refill  . amLODipine (NORVASC) 10 MG tablet Take 1 tablet by mouth daily.    Marland Kitchen aspirin EC 81 MG tablet Take 1 tablet by mouth daily.    Marland Kitchen dexamethasone (DECADRON) 2 MG tablet Take 1 tablet (2 mg total) by mouth 2 (two) times daily. 30 tablet 0  . glucose blood (ONETOUCH ULTRA) test strip USE AS DIRECTED TWICE A DAY TO CHECK BLOOD SUGAR    . levETIRAcetam (KEPPRA) 500 MG tablet Take 1 tablet (500 mg total) by mouth 2 (two) times daily. 60 tablet 0  . lisinopril-hydrochlorothiazide (PRINZIDE,ZESTORETIC) 20-12.5 MG tablet Take 1 tablet by mouth daily.    . metFORMIN (GLUCOPHAGE-XR) 500 MG 24 hr tablet Take 2 tablets by mouth daily.    . Omega-3 Fatty Acids (OMEGA-3 FISH OIL) 500 MG CAPS Take 1 capsule by mouth daily.    . pantoprazole (PROTONIX) 40 MG tablet Take 1 tablet by mouth daily.    Marland Kitchen ROCKLATAN 0.02-0.005 % SOLN Place 1 drop into the left eye daily.    . rosuvastatin (CRESTOR) 5 MG tablet Take 1 tablet by mouth daily.    . Saw Palmetto, Serenoa repens, (SAW PALMETTO PO) Take by mouth.     No current facility-administered medications for this visit.       Marland Kitchen  PHYSICAL EXAMINATION: ECOG PERFORMANCE STATUS: 1 - Symptomatic but completely ambulatory  Vitals:   08/21/20 1024  BP: (!) 144/64  Pulse: 76  Resp: 16  Temp: 98.1 F (36.7 C)  SpO2: 98%   Filed Weights   08/21/20 1024  Weight: 210 lb 6.4 oz (95.4 kg)    Physical Exam Constitutional:      Comments: Patient is walking independently.  Accompanied by his wife.  HENT:     Head: Normocephalic and atraumatic.     Mouth/Throat:     Mouth: Oropharynx is clear and moist.     Pharynx: No oropharyngeal exudate.  Eyes:     Pupils: Pupils are equal, round, and reactive to light.  Neck:     Comments: Noted to have left axillary adenopathy rubbery 1 x 2 cm in size. Cardiovascular:     Rate and Rhythm: Normal rate and regular rhythm.  Pulmonary:     Effort: No respiratory distress.     Breath sounds: No wheezing.     Comments: Decreased breath sounds bilaterally at the bases.  No wheeze or crackles. Abdominal:  General: Bowel sounds are normal. There is no distension.     Palpations: Abdomen is soft. There is no mass.     Tenderness: There is no abdominal tenderness. There is no guarding or rebound.  Musculoskeletal:        General: No tenderness or edema. Normal range of motion.     Cervical back: Normal range of motion and neck supple.  Skin:    General: Skin is warm.  Neurological:     Mental Status: He is alert and oriented to person, place, and time.  Psychiatric:        Mood and Affect: Affect normal.      LABORATORY DATA:  I have reviewed the data as listed Lab Results  Component Value Date   WBC 8.4 08/21/2020   HGB 14.4 08/21/2020   HCT 41.7 08/21/2020   MCV 91.9 08/21/2020   PLT 259 08/21/2020   Recent Labs    08/21/20 1120  NA 137  K 3.8  CL 103  CO2 25  GLUCOSE 171*  BUN 18  CREATININE 1.41*  CALCIUM 10.0  GFRNONAA 53*  PROT 7.2  ALBUMIN 3.8  AST 29  ALT 30  ALKPHOS 45  BILITOT 0.7    RADIOGRAPHIC STUDIES: I have personally  reviewed the radiological images as listed and agreed with the findings in the report. MR BRAIN W WO CONTRAST  Result Date: 08/18/2020 CLINICAL DATA:  Optic nerve edema. EXAM: MRI HEAD AND ORBITS WITHOUT AND WITH CONTRAST TECHNIQUE: Multiplanar, multiecho pulse sequences of the brain and surrounding structures were obtained without and with intravenous contrast. Multiplanar, multiecho pulse sequences of the orbits and surrounding structures were obtained including fat saturation techniques, before and after intravenous contrast administration. CONTRAST:  71mL GADAVIST GADOBUTROL 1 MMOL/ML IV SOLN COMPARISON:  None. FINDINGS: MRI HEAD FINDINGS Brain: Multiple enhancing lesions are seen scattered throughout the bilateral cerebellar and cerebral hemispheres, concerning for metastatic disease. Lesions show a peripheral rim enhancement, some of them with a target appearance. At least 16 lesions are identified. The largest lesions are seen in the left cerebellar hemisphere (22 mm), right temporal lobe (26 mm) and left occipital lobe (29 mm) with prominent surrounding vasogenic edema causing effacement of the adjacent cerebral sulci. No hydrocephalus, hemorrhage, acute infarct or midline shift. Vascular: Normal flow voids. Skull and upper cervical spine: Normal marrow signal. Other: Right mastoid effusion. MRI ORBITS FINDINGS Orbits: Bilateral lens surgery. Left scleral buckle surgery. Flattening of the bilateral optic disc, right greater than, likely related to increased intracranial pressure. Optic nerves are otherwise maintained. Orbital fat, extra ocular muscles and vascular structures appear preserved. Optic chiasm, cavernous sinus and suprasellar cistern are unremarkable. Visualized sinuses: Trace mucosal thickening scattered throughout the paranasal sinuses Soft tissues: Negative. IMPRESSION: 1. Multiple enhancing lesions scattered throughout the bilateral cerebellar and cerebral hemispheres, concerning for  metastatic disease. The largest lesions are seen in the left cerebellar hemisphere (22 mm), right temporal lobe (26 mm) and left occipital lobe (29 mm) with prominent surrounding vasogenic edema causing effacement of the adjacent cerebral sulci. 2. Flattening of the bilateral optic disc, right greater than left, likely related to increased intracranial pressure. 3. Right mastoid effusion. These results will be called to the ordering clinician or representative by the Radiologist Assistant, and communication documented in the PACS or [employer]. Electronically Signed   By: Pedro Earls M.D.   On: 08/18/2020 21:07   MR ORBITS W WO CONTRAST  Result Date: 08/18/2020 CLINICAL DATA:  Optic  nerve edema. EXAM: MRI HEAD AND ORBITS WITHOUT AND WITH CONTRAST TECHNIQUE: Multiplanar, multiecho pulse sequences of the brain and surrounding structures were obtained without and with intravenous contrast. Multiplanar, multiecho pulse sequences of the orbits and surrounding structures were obtained including fat saturation techniques, before and after intravenous contrast administration. CONTRAST:  8mL GADAVIST GADOBUTROL 1 MMOL/ML IV SOLN COMPARISON:  None. FINDINGS: MRI HEAD FINDINGS Brain: Multiple enhancing lesions are seen scattered throughout the bilateral cerebellar and cerebral hemispheres, concerning for metastatic disease. Lesions show a peripheral rim enhancement, some of them with a target appearance. At least 16 lesions are identified. The largest lesions are seen in the left cerebellar hemisphere (22 mm), right temporal lobe (26 mm) and left occipital lobe (29 mm) with prominent surrounding vasogenic edema causing effacement of the adjacent cerebral sulci. No hydrocephalus, hemorrhage, acute infarct or midline shift. Vascular: Normal flow voids. Skull and upper cervical spine: Normal marrow signal. Other: Right mastoid effusion. MRI ORBITS FINDINGS Orbits: Bilateral lens surgery. Left scleral  buckle surgery. Flattening of the bilateral optic disc, right greater than, likely related to increased intracranial pressure. Optic nerves are otherwise maintained. Orbital fat, extra ocular muscles and vascular structures appear preserved. Optic chiasm, cavernous sinus and suprasellar cistern are unremarkable. Visualized sinuses: Trace mucosal thickening scattered throughout the paranasal sinuses Soft tissues: Negative. IMPRESSION: 1. Multiple enhancing lesions scattered throughout the bilateral cerebellar and cerebral hemispheres, concerning for metastatic disease. The largest lesions are seen in the left cerebellar hemisphere (22 mm), right temporal lobe (26 mm) and left occipital lobe (29 mm) with prominent surrounding vasogenic edema causing effacement of the adjacent cerebral sulci. 2. Flattening of the bilateral optic disc, right greater than left, likely related to increased intracranial pressure. 3. Right mastoid effusion. These results will be called to the ordering clinician or representative by the Radiologist Assistant, and communication documented in the PACS or [employer]. Electronically Signed   By: Pedro Earls M.D.   On: 08/18/2020 21:07    ASSESSMENT & PLAN:   Brain metastasis ([place]) #Multiple brain lesions largest up to 22 mm with surrounding edema-highly concerning for metastatic disease to the brain.  Discussed with the patient/family that less likely but potential etiology would be infectious.   # Given the symptoms/multiplicity of brain lesions I would-recommend steroids/dexamethasone 2 mg twice daily..  Recommend evaluation with radiation oncology ASAP.  Discussed the risk of seizures with multiple brain lesions-recommend starting patient on Keppra.  Recommend CT scan chest and pelvis stat.  Also recommend labs CBC CMP LDH.  #Diabetes on metformin-discussed that with steroids; blood sugars would run high.  Recommend close monitoring twice a day blood  glucose checks.  Thank you Dr.Hedrick for allowing me to participate in the care of your pleasant patient. Please do not hesitate to contact me with questions or concerns in the interim.  # DISPOSITION: # Labs- today- cbc/cmp/LDH;CEA- pleae order # referral to radiation Oncology re: Brain metastases;ASAP # follow up in 1 week; no labs--Dr.B  # I reviewed the blood work- with the patient in detail; also reviewed the imaging independently [as summarized above]; and with the patient in detail.   Addendum: Patient is scheduled for CT scan this afternoon.  We will review the imaging findings with the patient and family-once results are available.   All questions were answered. The patient knows to call the clinic with any problems, questions or concerns.    Cammie Sickle, MD 08/21/2020 4:16 PM

## 2020-08-21 NOTE — Assessment & Plan Note (Addendum)
#  Multiple brain lesions largest up to 22 mm with surrounding edema-highly concerning for metastatic disease to the brain.  Discussed with the patient/family that less likely but potential etiology would be infectious.   # Given the symptoms/multiplicity of brain lesions I would-recommend steroids/dexamethasone 2 mg twice daily..  Recommend evaluation with radiation oncology ASAP.  Discussed the risk of seizures with multiple brain lesions-recommend starting patient on Keppra.  Recommend CT scan chest and pelvis stat.  Also recommend labs CBC CMP LDH.  #Diabetes on metformin-discussed that with steroids; blood sugars would run high.  Recommend close monitoring twice a day blood glucose checks.  Thank you Dr.Hedrick for allowing me to participate in the care of your pleasant patient. Please do not hesitate to contact me with questions or concerns in the interim.  # DISPOSITION: # Labs- today- cbc/cmp/LDH;CEA- pleae order # referral to radiation Oncology re: Brain metastases;ASAP # follow up in 1 week; no labs--Dr.B  # I reviewed the blood work- with the patient in detail; also reviewed the imaging independently [as summarized above]; and with the patient in detail.   Addendum: Patient is scheduled for CT scan this afternoon.  We will review the imaging findings with the patient and family-once results are available.

## 2020-08-22 ENCOUNTER — Telehealth: Payer: Self-pay | Admitting: Internal Medicine

## 2020-08-22 ENCOUNTER — Telehealth: Payer: Self-pay | Admitting: *Deleted

## 2020-08-22 DIAGNOSIS — R911 Solitary pulmonary nodule: Secondary | ICD-10-CM

## 2020-08-22 LAB — CEA: CEA: 71.5 ng/mL — ABNORMAL HIGH (ref 0.0–4.7)

## 2020-08-22 NOTE — Telephone Encounter (Signed)
Called addended report  ADDENDUM: A comparison CT from 07/10/2016 was made available. The RIGHT adrenal gland lesion is stable. 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  Additionally, there is nodularity within the bladder on comparison CT from 2017. This lowers the suspicion bladder neoplasm but does not exclude neoplasm.  Additionally, the retroperitoneal nodes described on current CT or are also stable compared to 07/10/2016.  The patient may benefit from outpatient FDG PET scan to identify source of presumed brain metastasis.   Electronically Signed   By: Suzy Bouchard M.D.   On: 08/22/2020 10:18

## 2020-08-22 NOTE — Telephone Encounter (Signed)
On 1/21-I spoke to patient regarding results of the CT scan.  Need for PET scan ASAP; also possible mediastinal lymph node biopsy.  Discussed with Dr.Aleskerov-who kindly agrees to evaluate the patient for mediastinal lymph node biopsy.  I also left a message for urology-with regards to bladder lesion.  C-PET ASAP; referral to [facility] ASAP Dx: mediastinal LN Bx].  Follow-up with me as planned.   Thanks GB

## 2020-08-22 NOTE — Telephone Encounter (Signed)
Md aware of results and notified patient

## 2020-08-25 ENCOUNTER — Telehealth: Payer: Self-pay | Admitting: Internal Medicine

## 2020-08-25 NOTE — Telephone Encounter (Signed)
Referral faxed to pulmonary- fax confirmation received.

## 2020-08-25 NOTE — Addendum Note (Signed)
Addended by: Gloris Ham on: 08/25/2020 11:51 AM   Modules accepted: Orders

## 2020-08-25 NOTE — Telephone Encounter (Signed)
Pet scan now approved. Please arrange asap

## 2020-08-25 NOTE — Telephone Encounter (Signed)
Spoke with pt's wife Katharine Look) to notify her of PET scan scheduled for 08/26/20.

## 2020-08-26 ENCOUNTER — Other Ambulatory Visit: Payer: Self-pay | Admitting: *Deleted

## 2020-08-26 ENCOUNTER — Other Ambulatory Visit: Payer: Self-pay

## 2020-08-26 ENCOUNTER — Encounter: Payer: Self-pay | Admitting: Radiation Oncology

## 2020-08-26 ENCOUNTER — Ambulatory Visit
Admission: RE | Admit: 2020-08-26 | Discharge: 2020-08-26 | Disposition: A | Discharge status: Home / Self Care | Payer: Medicare PPO | Source: Ambulatory Visit | Attending: Internal Medicine | Admitting: Internal Medicine

## 2020-08-26 ENCOUNTER — Ambulatory Visit
Admission: RE | Admit: 2020-08-26 | Discharge: 2020-08-26 | Disposition: A | Discharge status: Home / Self Care | Payer: Medicare PPO | Source: Ambulatory Visit | Attending: Radiation Oncology | Admitting: Radiation Oncology

## 2020-08-26 VITALS — BP 131/69 | HR 61 | Temp 96.5°F | Wt 208.0 lb

## 2020-08-26 DIAGNOSIS — C771 Secondary and unspecified malignant neoplasm of intrathoracic lymph nodes: Secondary | ICD-10-CM | POA: Diagnosis not present

## 2020-08-26 DIAGNOSIS — C801 Malignant (primary) neoplasm, unspecified: Secondary | ICD-10-CM | POA: Insufficient documentation

## 2020-08-26 DIAGNOSIS — R911 Solitary pulmonary nodule: Secondary | ICD-10-CM

## 2020-08-26 DIAGNOSIS — R918 Other nonspecific abnormal finding of lung field: Secondary | ICD-10-CM

## 2020-08-26 DIAGNOSIS — C7931 Secondary malignant neoplasm of brain: Secondary | ICD-10-CM

## 2020-08-26 LAB — GLUCOSE, CAPILLARY: Glucose-Capillary: 134 mg/dL — ABNORMAL HIGH (ref 70–99)

## 2020-08-26 IMAGING — CT NM PET TUM IMG INITIAL (PI) SKULL BASE T - THIGH
1 of 10 series · 1 of 25 positions shown · non-contrast
Comparison: CT [DATE]

CLINICAL DATA: Initial treatment strategy for malignancy of unknown
primary. Brain metastasis.

EXAM:
NUCLEAR MEDICINE PET SKULL BASE TO THIGH
TECHNIQUE: 11.0 mCi F-18 FDG was injected intravenously. Full-ring PET imaging
was performed from the skull base to thigh after the radiotracer. CT
data was obtained and used for attenuation correction and anatomic
localization.
Fasting blood glucose: 134 mg/dl

[Series 3: ct wb 5.0 b30f · axial · 5.0mm · 0.98mm/px · 1 of 329 slices shown]
[im 329/329  brain]
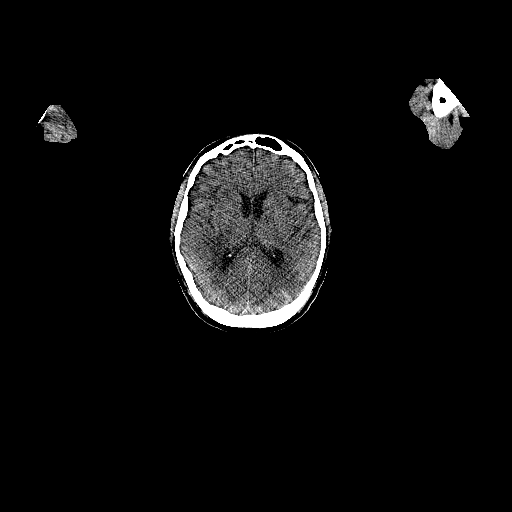

[1 of 25 positions shown; findings below may reference images not displayed]

FINDINGS: Mediastinal blood pool activity: SUV max

Liver activity: SUV max NA

NECK: Hypermetabolic lesion in the LEFT cerebellum corresponds of
brain metastasis on comparison MRI. Additional lesion in the RIGHT
temporal lobe and LEFT occipital lobe

Incidental CT findings: none

CHEST: Hypermetabolic RIGHT lower paratracheal lymph node measures
13 mm short axis with SUV max equal 5.4. Hypermetabolic subcarinal
lymph node SUV max equal

Small subpleural nodule in the RIGHT lower lobe measures 6 mm does
not have significant metabolic activity. No hypermetabolic pulmonary
nodules evident.

Incidental CT findings: none

ABDOMEN/PELVIS: No abnormal metabolic activity in liver. No abnormal
metabolic activity associated with the RIGHT adrenal gland which is
low attenuation consistent with benign adenoma.

No hypermetabolic abdominal or pelvic lymph nodes. Specifically no
metabolic activity associated with the small periaortic
retroperitoneal lymph nodes. No abnormal activity in the pancreas.

There is a diffuse metabolic activity associated with the small
bowel and colon without focality. No evidence of bowel obstruction.

Incidental CT findings: Bilateral small  hydroceles.

SKELETON: No evidence skeletal metastasis.

Incidental CT findings: none
IMPRESSION: 1. Hypermetabolic mediastinal lymph nodes consistent with metastatic
mediastinal adenopathy.
2. No significant metabolic activity associated with small RIGHT
lower lobe pulmonary nodule. This nodule remains indeterminate.
3. No primary malignancy identified on skull base to thigh PET-CT
scan.
4. Hypermetabolic brain metastasis noted.

## 2020-08-26 MED ORDER — FLUDEOXYGLUCOSE F - 18 (FDG) INJECTION
11.0000 | Freq: Once | INTRAVENOUS | Status: AC | PRN
  Administered 2020-08-26: 11 via INTRAVENOUS

## 2020-08-26 NOTE — Consult Note (Signed)
NEW PATIENT EVALUATION  Name: Alejandro Randolph  MRN: [id number]  Date:   08/26/2020     DOB: 07/28/1946   This 75 y.o. 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  REFERRING PHYSICIAN: Maryland Pink, MD  CHIEF COMPLAINT: No chief complaint on file.   DIAGNOSIS: There were no encounter diagnoses.   PREVIOUS INVESTIGATIONS:  MRI of brain reviewed PET CT scan reviewed Clinical notes reviewed  HPI: Patient is a 75 year old male who presented with headaches nausea.  MRI of the scan shows multiple bilateral cerebellar and cerebral lesions consistent with metastatic disease. Patient was started on steroids and his symptoms improved. He is having no focal neurologic deficits no change in visual fields. PET CT scan was performed showing hypermetabolic activity in the mediastinal lymph nodes consistent with metastatic adenopathy. He had a small right lower lobe pulmonary nodule which was not hypermetabolic. The brain mets were hypermetabolic. Patient has not lost any weight specifically denies any pulmonary symptoms such as cough hemoptysis or chest tightness. He has been scheduled for evaluation by pulmonology to try to obtain tissue diagnosis.  PLANNED TREATMENT REGIMEN: Whole brain radiation  PAST MEDICAL HISTORY:  has a past medical history of Abscess of sigmoid colon, Diabetes mellitus without complication ([place]), GERD (gastroesophageal reflux disease), Hypercholesteremia, and Hypertension.    PAST SURGICAL HISTORY:  Past Surgical History:  Procedure Laterality Date  . CHOLECYSTECTOMY    . COLONOSCOPY WITH PROPOFOL N/A 07/19/2016   Procedure: COLONOSCOPY WITH PROPOFOL;  Surgeon: Jonathon Bellows, MD;  Location: [facility] ORS;  Service: Endoscopy;  Laterality: N/A;  . COLONOSCOPY WITH PROPOFOL N/A 09/02/2016   Procedure: COLONOSCOPY WITH PROPOFOL;  Surgeon: Jonathon Bellows, MD;  Location: [facility] END[facility];  Service:  Endoscopy;  Laterality: N/A;  . femoral stents    . FLEXIBLE SIGMOIDOSCOPY  07/19/2016   Procedure: FLEXIBLE SIGMOIDOSCOPY;  Surgeon: Jonathon Bellows, MD;  Location: [facility] ORS;  Service: Endoscopy;;  . HERNIA REPAIR    . RETINAL DETACHMENT SURGERY    . TONSILLECTOMY      FAMILY HISTORY: family history includes Diabetes in his father and mother; Hypertension in his mother.  SOCIAL HISTORY:  reports that he has quit smoking. He has never used smokeless tobacco. He reports that he does not drink alcohol and does not use drugs.  ALLERGIES: Atorvastatin  MEDICATIONS:  Current Outpatient Medications  Medication Sig Dispense Refill  . amLODipine (NORVASC) 10 MG tablet Take 1 tablet by mouth daily.    Marland Kitchen aspirin EC 81 MG tablet Take 1 tablet by mouth daily.    Marland Kitchen dexamethasone (DECADRON) 2 MG tablet Take 1 tablet (2 mg total) by mouth 2 (two) times daily. 30 tablet 0  . glucose blood (ONETOUCH ULTRA) test strip USE AS DIRECTED TWICE A DAY TO CHECK BLOOD SUGAR    . levETIRAcetam (KEPPRA) 500 MG tablet Take 1 tablet (500 mg total) by mouth 2 (two) times daily. 60 tablet 0  . lisinopril-hydrochlorothiazide (PRINZIDE,ZESTORETIC) 20-12.5 MG tablet Take 1 tablet by mouth daily.    . metFORMIN (GLUCOPHAGE-XR) 500 MG 24 hr tablet Take 2 tablets by mouth daily.    . Omega-3 Fatty Acids (OMEGA-3 FISH OIL) 500 MG CAPS Take 1 capsule by mouth daily.    . pantoprazole (PROTONIX) 40 MG tablet Take 1 tablet by mouth daily.    Marland Kitchen ROCKLATAN 0.02-0.005 % SOLN Place 1 drop into the left eye daily.    . rosuvastatin (CRESTOR)  5 MG tablet Take 1 tablet by mouth daily.    . Saw Palmetto, Serenoa repens, (SAW PALMETTO PO) Take by mouth.     No current facility-administered medications for this encounter.    ECOG PERFORMANCE STATUS:  1 - Symptomatic but completely ambulatory  REVIEW OF SYSTEMS: Patient denies any weight loss, fatigue, weakness, fever, chills or night sweats. Patient denies any loss of vision, blurred  vision. Patient denies any ringing  of the ears or hearing loss. No irregular heartbeat. Patient denies heart murmur or history of fainting. Patient denies any chest pain or pain radiating to her upper extremities. Patient denies any shortness of breath, difficulty breathing at night, cough or hemoptysis. Patient denies any swelling in the lower legs. Patient denies any nausea vomiting, vomiting of blood, or coffee ground material in the vomitus. Patient denies any stomach pain. Patient states has had normal bowel movements no significant constipation or diarrhea. Patient denies any dysuria, hematuria or significant nocturia. Patient denies any problems walking, swelling in the joints or loss of balance. Patient denies any skin changes, loss of hair or loss of weight. Patient denies any excessive worrying or anxiety or significant depression. Patient denies any problems with insomnia. Patient denies excessive thirst, polyuria, polydipsia. Patient denies any swollen glands, patient denies easy bruising or easy bleeding. Patient denies any recent infections, allergies or URI. Patient "s visual fields have not changed significantly in recent time.   PHYSICAL EXAM: BP 131/69   Pulse 61   Temp (!) 96.5 F (35.8 C) (Tympanic)   Wt 208 lb (94.3 kg)   BMI 30.72 kg/m  Well-developed well-nourished patient in NAD. HEENT reveals PERLA, EOMI, discs not visualized.  Oral cavity is clear. No oral mucosal lesions are identified. Neck is clear without evidence of cervical or supraclavicular adenopathy. Lungs are clear to A&P. Cardiac examination is essentially unremarkable with regular rate and rhythm without murmur rub or thrill. Abdomen is benign with no organomegaly or masses noted. Motor sensory and DTR levels are equal and symmetric in the upper and lower extremities. Cranial nerves II through XII are grossly intact. Proprioception is intact. No peripheral adenopathy or edema is identified. No motor or sensory levels  are noted. Crude visual fields are within normal range.  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 I will await tissue diagnosis before commencing radiation although I believe I can see him next week for simulation. I would plan on delivering 30 Gray in 10 fractions. Risks and benefits of treatment [place] hair loss skin reaction of the scalp fatigue alteration of blood counts all were discussed in detail with the patient and his wife. They both seem to comprehend my treatment plan well. Further recommendations will be made as histology is obtained.  I would like to take this opportunity to thank you for allowing me to participate in the care of your patient.Noreene Filbert, MD

## 2020-08-27 ENCOUNTER — Telehealth: Payer: Self-pay | Admitting: Internal Medicine

## 2020-08-27 NOTE — Telephone Encounter (Signed)
Colette - will you call [facility]-Dr. Lanney Gins to follow-up on the referral. I faxed the referral the other day.

## 2020-08-27 NOTE — Telephone Encounter (Signed)
On 1/25-I spoke to patient regarding results of the PET scan; need for mediastinal lymph node biopsy.  Patient states that he does not have an appointment with Dr.Aleskerov yet.  Also discussed with Dr.Aleskerov.   Patient will keep appointment as planned.

## 2020-08-28 ENCOUNTER — Encounter
Admission: RE | Admit: 2020-08-28 | Discharge: 2020-08-28 | Disposition: A | Discharge status: Home / Self Care | Payer: Medicare PPO | Source: Ambulatory Visit | Attending: Pulmonary Disease | Admitting: Pulmonary Disease

## 2020-08-28 ENCOUNTER — Other Ambulatory Visit: Payer: Medicare PPO

## 2020-08-28 ENCOUNTER — Other Ambulatory Visit: Payer: Self-pay

## 2020-08-28 HISTORY — DX: Peptic ulcer, site unspecified, unspecified as acute or chronic, without hemorrhage or perforation: K27.9

## 2020-08-28 HISTORY — DX: Peripheral vascular disease, unspecified: I73.9

## 2020-08-28 NOTE — Progress Notes (Signed)
Tumor Board Documentation  Alejandro Randolph was presented by Dr Rogue Bussing at our Tumor Board on 08/28/2020, which included representatives from medical oncology,radiation oncology,internal medicine,navigation,pathology,radiology,surgical,pharmacy,genetics,palliative care,pulmonology.  Alejandro Randolph currently presents as a new patient,for discussion with history of the following treatments: active survellience.  Additionally, we reviewed previous medical and familial history, history of present illness, and recent lab results along with all available histopathologic and imaging studies. The tumor board considered available treatment options and made the following recommendations: Biopsy,Radiation therapy (primary modality) (Referred to Pulmmonology for biopsy) Steroids  The following procedures/referrals were also placed: No orders of the defined types were placed in this encounter.   Clinical Trial Status: not discussed   Staging used: Clinical Stage  AJCC Staging:       Group: Brain metastesis Primary to be determined   National site-specific guidelines   were discussed with respect to the case.  Tumor board is a meeting of clinicians from various specialty areas who evaluate and discuss patients for whom a multidisciplinary approach is being considered. Final determinations in the plan of care are those of the provider(s). The responsibility for follow up of recommendations given during tumor board is that of the provider.   Today's extended care, comprehensive team conference, Alejandro Randolph was not present for the discussion and was not examined.   Multidisciplinary Tumor Board is a multidisciplinary case peer review process.  Decisions discussed in the Multidisciplinary Tumor Board reflect the opinions of the specialists present at the conference without having examined the patient.  Ultimately, treatment and diagnostic decisions rest with the primary provider(s) and the patient.

## 2020-08-28 NOTE — Patient Instructions (Signed)
Your procedure is scheduled on: 09/01/20 Report to [place]. To find out your arrival time please call [phone] between 1PM - 3PM on 08/29/20.  Remember: Instructions that are not followed completely may result in serious medical risk, up to and including death, or upon the discretion of your surgeon and anesthesiologist your surgery may need to be rescheduled.     _X__ 1. Do not eat food after midnight the night before your procedure.                 No gum chewing or hard candies. You may drink clear liquids up to 2 hours                 before you are scheduled to arrive for your surgery- DO not drink clear                 liquids within 2 hours of the start of your surgery.                 Clear Liquids include:  water, [place] juice without pulp, clear carbohydrate                 drink such as Clearfast or Gatorade, Black Coffee or Tea (Do not add                 anything to coffee or tea). Diabetics water only  __X__2.  On the morning of surgery brush your teeth with toothpaste and water, you                 may rinse your mouth with mouthwash if you wish.  Do not swallow any              toothpaste of mouthwash.     _X__ 3.  No Alcohol for 24 hours before or after surgery.   _X__ 4.  Do Not Smoke or use e-cigarettes For 24 Hours Prior to Your Surgery.                 Do not use any chewable tobacco products for at least 6 hours prior to                 surgery.  ____  5.  Bring all medications with you on the day of surgery if instructed.   __X__  6.  Notify your doctor if there is any change in your medical condition      (cold, fever, infections).     Do not wear jewelry, make-up, hairpins, clips or nail polish. Do not wear lotions, powders, or perfumes.  Do not shave 48 hours prior to surgery. Men may shave face and neck. Do not bring valuables to the hospital.    [facility] is not responsible for any belongings or  valuables.  Contacts, dentures/partials or body piercings may not be worn into surgery. Bring a case for your contacts, glasses or hearing aids, a denture cup will be supplied. Leave your suitcase in the car. After surgery it may be brought to your room. For patients admitted to the hospital, discharge time is determined by your treatment team.   Patients discharged the day of surgery will not be allowed to drive home.   Please read over the following fact sheets that you were given:     __X__ Take these medicines the morning of surgery with A SIP OF WATER:  1. amLODipine (NORVASC) 10 MG tablet  2. dexamethasone (DECADRON) 2 MG tablet  3. levETIRAcetam (KEPPRA) 500 MG tablet  4. pantoprazole (PROTONIX) 40 MG tablet  5.  6.  ____ Fleet Enema (as directed)   ____ Use CHG Soap/SAGE wipes as directed  ____ Use inhalers on the day of surgery  __X__ Stop metformin/Janumet/Farxiga 2 days prior to surgery  NO METFORMIN Saturday OR Sunday   ____ Take 1/2 of usual insulin dose the night before surgery. No insulin the morning          of surgery.   ____ Stop Blood Thinners Coumadin/Plavix/Xarelto/Pleta/Pradaxa/Eliquis/Effient/Aspirin  on   Or contact your Surgeon, Cardiologist or Medical Doctor regarding  ability to stop your blood thinners  __X__ Stop Anti-inflammatories 7 days before surgery such as Advil, Ibuprofen, Motrin,  BC or Goodies Powder, Naprosyn, Naproxen, Aleve, Aspirin    __X__ Stop all herbal supplements, fish oil or vitamin E until after surgery.  STOP SAW PALMETTO, FISH OIL TODAY 08/28/20  ____ Bring C-Pap to the hospital.

## 2020-08-29 ENCOUNTER — Telehealth: Payer: Self-pay | Admitting: Radiation Oncology

## 2020-08-29 ENCOUNTER — Other Ambulatory Visit: Payer: Medicare PPO

## 2020-08-29 ENCOUNTER — Encounter: Payer: Self-pay | Admitting: Internal Medicine

## 2020-08-29 ENCOUNTER — Encounter
Admission: RE | Admit: 2020-08-29 | Discharge: 2020-08-29 | Disposition: A | Discharge status: Home / Self Care | Payer: Medicare PPO | Source: Ambulatory Visit | Attending: Pulmonary Disease | Admitting: Pulmonary Disease

## 2020-08-29 ENCOUNTER — Other Ambulatory Visit: Payer: Self-pay

## 2020-08-29 ENCOUNTER — Telehealth: Payer: Self-pay

## 2020-08-29 ENCOUNTER — Inpatient Hospital Stay: Payer: Medicare PPO | Admitting: Internal Medicine

## 2020-08-29 VITALS — BP 125/49 | HR 61 | Temp 96.5°F | Ht 69.0 in | Wt 216.0 lb

## 2020-08-29 DIAGNOSIS — C7931 Secondary malignant neoplasm of brain: Secondary | ICD-10-CM | POA: Diagnosis not present

## 2020-08-29 DIAGNOSIS — E119 Type 2 diabetes mellitus without complications: Secondary | ICD-10-CM | POA: Diagnosis not present

## 2020-08-29 DIAGNOSIS — R911 Solitary pulmonary nodule: Secondary | ICD-10-CM | POA: Diagnosis not present

## 2020-08-29 DIAGNOSIS — Z20822 Contact with and (suspected) exposure to covid-19: Secondary | ICD-10-CM | POA: Diagnosis not present

## 2020-08-29 DIAGNOSIS — Z01818 Encounter for other preprocedural examination: Secondary | ICD-10-CM | POA: Insufficient documentation

## 2020-08-29 DIAGNOSIS — R948 Abnormal results of function studies of other organs and systems: Secondary | ICD-10-CM

## 2020-08-29 DIAGNOSIS — D432 Neoplasm of uncertain behavior of brain, unspecified: Secondary | ICD-10-CM | POA: Diagnosis not present

## 2020-08-29 DIAGNOSIS — I10 Essential (primary) hypertension: Secondary | ICD-10-CM | POA: Diagnosis not present

## 2020-08-29 MED ORDER — NA SULFATE-K SULFATE-MG SULF 17.5-3.13-1.6 GM/177ML PO SOLN: 1.0000 | Freq: Once | ORAL | 0 refills | Status: AC

## 2020-08-29 NOTE — Assessment & Plan Note (Addendum)
#  Multiple brain lesions largest up to 22 mm with surrounding edema-highly concerning for metastatic disease to the brain.PET scan- JAN 25th, 2022-Hypermetabolic mediastinal lymph nodes;  No significant metabolic activity associated with small RIGHT lower lobe pulmonary nodule. This nodule remains indeterminate;  No primary malignancy identified on skull base to thigh PET-CT Scan; Hypermetabolic brain metastasis noted; bladder nodules [on CT scan]-see below.  #Mediastinal adenopathy hypermetabolic-again concerning for metastatic disease question primary.  Discussed with Dr.Aleskerov-patient awaiting EBUS biopsy on 1/31.  Discussed that systemic options based upon results of biopsy.  #Multiple brain metastases-status post evaluation with Dr. Donella Stade.  Awaiting start radiation; dexamethasone 2 mg twice daily; Keppra.Take dex 2 mg a day given no symtoms/elevated BG .  #Bladder nodules noted on CT scan-no evidence of anemia/hematuria.  Await mediastinal lymph node biopsy for further work-up.  #Right colon mass-colonoscopy approximately 3 to 4 years ago.  Discussed with Dr. Vicente Males; plan colonoscopy this week.  Especially given elevated CEA.  #Diabetes on metformin-on dex 2mg  BID [ FBS- 170; PP- 248];  # DISPOSITION: # Follow up in 1 week;MD;  labs cbc/bmp-dr.B  # I reviewed the blood work- with the patient in detail; also reviewed the imaging independently [as summarized above]; and with the patient in detail.

## 2020-08-29 NOTE — Telephone Encounter (Signed)
Pt's wife called to clarify whether or not CT sim appt on 1/31 is needed. She would like a call back.

## 2020-08-29 NOTE — Telephone Encounter (Signed)
Called patient to inform him the procedure has been scheduled for next Thursday and instructions have been sent to his my chart. Bowel prep has been sent to pharmacy as well. Pt verbalized understanding.

## 2020-08-29 NOTE — Progress Notes (Signed)
[facility] CONSULT NOTE  Patient Care Team: [place] Pink, MD as PCP - General (Family Medicine) Ottie Glazier, MD as Consulting Physician (Pulmonary Disease)  CHIEF COMPLAINTS/PURPOSE OF CONSULTATION: Brain lesions    Oncology History Overview Note  # JAN 17th, 2022-Headaches/Vomitting [Dr.Brasington; ophthalmology brain MRI]-multiple enhancing lesions the brain- bilateral cerebellar and cerebral hemispheres, concerning for metastatic disease. The largest lesions are seen in the left cerebellar hemisphere (22 mm), right temporal lobe (26 mm) and left occipital lobe (29 mm) with prominent surrounding vasogenic edema causing effacement of the adjacent cerebral sulci. 2. Flattening of the bilateral optic disc, right greater than left, likely related to increased intracranial pressure. Right mastoid effusion.  # PVD [Dr.Schneir s/p stenting]     Brain metastasis ([place])  08/21/2020 Initial Diagnosis   Brain metastasis ([facility])     HISTORY OF PRESENTING ILLNESS:  Alejandro Randolph 75 y.o.  male patient former smoker-with brain lesions suggestive of metastatic disease; is here today with results of the CT scan/PET scan.  In the interim has been evaluated by radiation oncology.  Patient currently denies any worsening bone pain.  Denies any worsening headache.  No nausea or vomiting.   Review of Systems  Constitutional: Negative for chills, diaphoresis, fever, malaise/fatigue and weight loss.  HENT: Negative for nosebleeds and sore throat.   Eyes: Negative for double vision.  Respiratory: Negative for cough, hemoptysis and wheezing.   Cardiovascular: Negative for chest pain, palpitations, orthopnea and leg swelling.  Gastrointestinal: Negative for abdominal pain, blood in stool, constipation, diarrhea, heartburn, melena, nausea and vomiting.  Genitourinary: Negative for dysuria, frequency and urgency.  Musculoskeletal: Positive for back pain. Negative for joint pain.   Skin: Negative.  Negative for itching and rash.  Neurological: Negative for dizziness, tingling, focal weakness and weakness.  Endo/Heme/Allergies: Does not bruise/bleed easily.  Psychiatric/Behavioral: Negative for depression. The patient is not nervous/anxious and does not have insomnia.      MEDICAL HISTORY:  Past Medical History:  Diagnosis Date  . Abscess of sigmoid colon   . Diabetes mellitus without complication ([place])   . GERD (gastroesophageal reflux disease)   . Hypercholesteremia   . Hypertension   . Peripheral vascular disease ([place])   . PUD (peptic ulcer disease)     SURGICAL HISTORY: Past Surgical History:  Procedure Laterality Date  . CHOLECYSTECTOMY    . COLONOSCOPY WITH PROPOFOL N/A 07/19/2016   Procedure: COLONOSCOPY WITH PROPOFOL;  Surgeon: Jonathon Bellows, MD;  Location: [facility] ORS;  Service: Endoscopy;  Laterality: N/A;  . COLONOSCOPY WITH PROPOFOL N/A 09/02/2016   Procedure: COLONOSCOPY WITH PROPOFOL;  Surgeon: Jonathon Bellows, MD;  Location: [facility] END[facility];  Service: Endoscopy;  Laterality: N/A;  . femoral stents Bilateral   . FLEXIBLE SIGMOIDOSCOPY  07/19/2016   Procedure: FLEXIBLE SIGMOIDOSCOPY;  Surgeon: Jonathon Bellows, MD;  Location: [facility] ORS;  Service: Endoscopy;;  . HERNIA REPAIR    . RETINAL DETACHMENT SURGERY    . TONSILLECTOMY      SOCIAL HISTORY: Social History   Socioeconomic History  . Marital status: Married    Spouse name: Not on file  . Number of children: Not on file  . Years of education: Not on file  . Highest education level: Not on file  Occupational History  . Not on file  Tobacco Use  . Smoking status: Former Smoker    Quit date: 2001    Years since quitting: 21.0  . Smokeless tobacco: Never Used  Vaping Use  . Vaping Use: Never used  Substance and Sexual Activity  . Alcohol use: No  . Drug use: No  . Sexual activity: Not on file  Other Topics Concern  . Not on file  Social History Narrative   Lives in [place]; lives with wife; 1 son-  lives close by.[id number]- Quit smoking- 2001. No Alcohol.    Social Determinants of Health   Financial Resource Strain: Not on file  Food Insecurity: Not on file  Transportation Needs: Not on file  Physical Activity: Not on file  Stress: Not on file  Social Connections: Not on file  Intimate Partner Violence: Not on file    FAMILY HISTORY: Family History  Problem Relation Age of Onset  . Diabetes Mother   . Hypertension Mother   . Diabetes Father     ALLERGIES:  is allergic to atorvastatin.  MEDICATIONS:  Current Outpatient Medications  Medication Sig Dispense Refill  . amLODipine (NORVASC) 10 MG tablet Take 10 mg by mouth daily.    Marland Kitchen dexamethasone (DECADRON) 2 MG tablet Take 1 tablet (2 mg total) by mouth 2 (two) times daily. 30 tablet 0  . fluticasone (FLONASE) 50 MCG/ACT nasal spray Place 1 spray into both nostrils daily as needed for allergies.    Marland Kitchen glucose blood (ONETOUCH ULTRA) test strip USE AS DIRECTED TWICE A DAY TO CHECK BLOOD SUGAR    . levETIRAcetam (KEPPRA) 500 MG tablet Take 1 tablet (500 mg total) by mouth 2 (two) times daily. 60 tablet 0  . lisinopril-hydrochlorothiazide (PRINZIDE,ZESTORETIC) 20-12.5 MG tablet Take 1 tablet by mouth daily.    . pantoprazole (PROTONIX) 40 MG tablet Take 40 mg by mouth daily.    Marland Kitchen ROCKLATAN 0.02-0.005 % SOLN Place 1 drop into the left eye daily.    . rosuvastatin (CRESTOR) 5 MG tablet Take 5 mg by mouth at bedtime.    . Saw Palmetto, Serenoa repens, (SAW PALMETTO PO) Take 450 mg by mouth daily.    Marland Kitchen aspirin EC 81 MG tablet Take 81 mg by mouth daily. (Patient not taking: Reported on 08/29/2020)    . metFORMIN (GLUCOPHAGE-XR) 500 MG 24 hr tablet Take 500 mg by mouth 2 (two) times daily. (Patient not taking: Reported on 08/29/2020)    . Omega-3 Fatty Acids (OMEGA-3 FISH OIL) 500 MG CAPS Take 500 mg by mouth daily. (Patient not taking: Reported on 08/29/2020)     No current facility-administered medications for this visit.    Facility-Administered Medications Ordered in Other Visits  Medication Dose Route Frequency Provider Last Rate Last Admin  . 0.9 %  sodium chloride infusion   Intravenous Continuous Tera Mater, MD      . chlorhexidine (PERIDEX) 0.12 % solution 15 mL  15 mL Mouth/Throat Once Ramsdell, Brett Canales, MD       Or  . MEDLINE mouth rinse  15 mL Mouth Rinse Once Tera Mater, MD          .  PHYSICAL EXAMINATION: ECOG PERFORMANCE STATUS: 1 - Symptomatic but completely ambulatory  Vitals:   08/29/20 0947  BP: (!) 125/49  Pulse: 61  Temp: (!) 96.5 F (35.8 C)  SpO2: 98%   Filed Weights   08/29/20 0947  Weight: 216 lb (98 kg)    Physical Exam Constitutional:      Comments: Patient is walking independently.  Accompanied by his wife.  HENT:     Head: Normocephalic and atraumatic.     Mouth/Throat:     Pharynx: No oropharyngeal exudate.  Eyes:  Pupils: Pupils are equal, round, and reactive to light.  Cardiovascular:     Rate and Rhythm: Normal rate and regular rhythm.  Pulmonary:     Effort: No respiratory distress.     Breath sounds: No wheezing.     Comments: Decreased breath sounds bilaterally at the bases.  No wheeze or crackles. Abdominal:     General: Bowel sounds are normal. There is no distension.     Palpations: Abdomen is soft. There is no mass.     Tenderness: There is no abdominal tenderness. There is no guarding or rebound.  Musculoskeletal:        General: No tenderness. Normal range of motion.     Cervical back: Normal range of motion and neck supple.  Skin:    General: Skin is warm.  Neurological:     Mental Status: He is alert and oriented to person, place, and time.  Psychiatric:        Mood and Affect: Affect normal.      LABORATORY DATA:  I have reviewed the data as listed Lab Results  Component Value Date   WBC 8.4 08/21/2020   HGB 14.4 08/21/2020   HCT 41.7 08/21/2020   MCV 91.9 08/21/2020   PLT 259 08/21/2020   Recent  Labs    08/21/20 1120  NA 137  K 3.8  CL 103  CO2 25  GLUCOSE 171*  BUN 18  CREATININE 1.41*  CALCIUM 10.0  GFRNONAA 53*  PROT 7.2  ALBUMIN 3.8  AST 29  ALT 30  ALKPHOS 45  BILITOT 0.7    RADIOGRAPHIC STUDIES: I have personally reviewed the radiological images as listed and agreed with the findings in the report. MR BRAIN W WO CONTRAST  Result Date: 08/18/2020 CLINICAL DATA:  Optic nerve edema. EXAM: MRI HEAD AND ORBITS WITHOUT AND WITH CONTRAST TECHNIQUE: Multiplanar, multiecho pulse sequences of the brain and surrounding structures were obtained without and with intravenous contrast. Multiplanar, multiecho pulse sequences of the orbits and surrounding structures were obtained including fat saturation techniques, before and after intravenous contrast administration. CONTRAST:  42mL GADAVIST GADOBUTROL 1 MMOL/ML IV SOLN COMPARISON:  None. FINDINGS: MRI HEAD FINDINGS Brain: Multiple enhancing lesions are seen scattered throughout the bilateral cerebellar and cerebral hemispheres, concerning for metastatic disease. Lesions show a peripheral rim enhancement, some of them with a target appearance. At least 16 lesions are identified. The largest lesions are seen in the left cerebellar hemisphere (22 mm), right temporal lobe (26 mm) and left occipital lobe (29 mm) with prominent surrounding vasogenic edema causing effacement of the adjacent cerebral sulci. No hydrocephalus, hemorrhage, acute infarct or midline shift. Vascular: Normal flow voids. Skull and upper cervical spine: Normal marrow signal. Other: Right mastoid effusion. MRI ORBITS FINDINGS Orbits: Bilateral lens surgery. Left scleral buckle surgery. Flattening of the bilateral optic disc, right greater than, likely related to increased intracranial pressure. Optic nerves are otherwise maintained. Orbital fat, extra ocular muscles and vascular structures appear preserved. Optic chiasm, cavernous sinus and suprasellar cistern are  unremarkable. Visualized sinuses: Trace mucosal thickening scattered throughout the paranasal sinuses Soft tissues: Negative. IMPRESSION: 1. Multiple enhancing lesions scattered throughout the bilateral cerebellar and cerebral hemispheres, concerning for metastatic disease. The largest lesions are seen in the left cerebellar hemisphere (22 mm), right temporal lobe (26 mm) and left occipital lobe (29 mm) with prominent surrounding vasogenic edema causing effacement of the adjacent cerebral sulci. 2. Flattening of the bilateral optic disc, right greater than left, likely related to increased  intracranial pressure. 3. Right mastoid effusion. These results will be called to the ordering clinician or representative by the Radiologist Assistant, and communication documented in the PACS or [employer]. Electronically Signed   By: Pedro Earls M.D.   On: 08/18/2020 21:07   CT CHEST ABDOMEN PELVIS W CONTRAST  Addendum Date: 08/22/2020   ADDENDUM REPORT: 08/22/2020 10:18 ADDENDUM: A comparison CT from 07/10/2016 was made available. The RIGHT adrenal gland lesion is stable. 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 Additionally, there is nodularity within the bladder on comparison CT from 2017. This lowers the suspicion bladder neoplasm but does not exclude neoplasm. Additionally, the retroperitoneal nodes described on current CT or are also stable compared to 07/10/2016. The patient may benefit from outpatient FDG PET scan to identify source of presumed brain metastasis. Electronically Signed   By: Suzy Bouchard M.D.   On: 08/22/2020 10:18   Result Date: 08/22/2020 CLINICAL DATA:  Brain lesions.  Concern for brain metastasis. EXAM: CT CHEST, ABDOMEN, AND PELVIS WITH CONTRAST TECHNIQUE: Multidetector CT imaging of the chest, abdomen and pelvis was performed following the standard protocol during bolus administration of intravenous  contrast. CONTRAST:  146mL OMNIPAQUE IOHEXOL 300 MG/ML  SOLN COMPARISON:  None. FINDINGS: CT CHEST FINDINGS Cardiovascular: No significant vascular findings. Normal heart size. No pericardial effusion. Mediastinum/Nodes: Enlarged RIGHT paratracheal node measures 15 mm. Enlarged subcarinal node measures 11 mm. Lungs/Pleura: 7 mm nodule in the RIGHT lower lobe (image 56/4). Musculoskeletal: No aggressive osseous lesion. CT ABDOMEN AND PELVIS FINDINGS Hepatobiliary: No focal hepatic lesion. Postcholecystectomy. No biliary dilatation. Pancreas: Pancreas is normal. No ductal dilatation. No pancreatic inflammation. Spleen: Normal spleen Adrenals/urinary tract: Rounded nodule of the RIGHT adrenal gland measures 2.4 cm. LEFT adrenal gland normal. No enhancing renal cortical lesion. Bilateral simple fluid attenuation renal cysts. Extrarenal pelvis on the LEFT. Enhancing focus of flattened enhancing tissue posterior LEFT aspect bladder measures 13 mm by 4 mm (image 29/2) Stomach/Bowel: Large hiatal hernia. Small bowel normal. Cecum normal. Appendix not identified. The colon and rectosigmoid colon are normal. Vascular/Lymphatic: Abdominal aorta is normal in caliber with intimal calcification. Rounded node in the retroperitoneum adjacent to the LEFT ureter measures 6 mm (image 83/2. Small the RIGHT common iliac node measures 6 mm on image 89/2. Small LEFT external iliac lymph node measures 7 mm image 22. Reproductive: Prostate enlarged to 16 mm Other: There is peritoneal metastasis. Musculoskeletal: No aggressive osseous lesion. IMPRESSION: Chest Impression: 1. Single pulmonary nodule in the RIGHT lower lobe is concerning for solitary pulmonary metastasis. 2. Metastatic mediastinal lymphadenopathy. Abdomen / Pelvis Impression: 1. Flattened enhancing lesion in the posterior LEFT bladder is concerning for bladder neoplasm. 2. Several subcentimeter rounded lymph nodes in the LEFT retroperitoneum and along the iliac vessels are not  pathologic by size criteria but concerning for nodal metastasis. 3. Enlarged RIGHT adrenal gland concerning for RIGHT adrenal metastasis. Electronically Signed: By: Suzy Bouchard M.D. On: 08/21/2020 18:20   NM PET Image Initial (PI) Skull Base To Thigh  Result Date: 08/26/2020 CLINICAL DATA:  Initial treatment strategy for malignancy of unknown primary. Brain metastasis. EXAM: NUCLEAR MEDICINE PET SKULL BASE TO THIGH TECHNIQUE: 11.0 mCi F-18 FDG was injected intravenously. Full-ring PET imaging was performed from the skull base to thigh after the radiotracer. CT data was obtained and used for attenuation correction and anatomic localization. Fasting blood glucose: 134 mg/dl COMPARISON:  CT 08/21/2020 FINDINGS: Mediastinal blood pool activity: SUV max Liver  activity: SUV max NA NECK: Hypermetabolic lesion in the LEFT cerebellum corresponds of brain metastasis on comparison MRI. Additional lesion in the RIGHT temporal lobe and LEFT occipital lobe Incidental CT findings: none CHEST: Hypermetabolic RIGHT lower paratracheal lymph node measures 13 mm short axis with SUV max equal 5.4. Hypermetabolic subcarinal lymph node SUV max equal 7.1 Small subpleural nodule in the RIGHT lower lobe measures 6 mm does not have significant metabolic activity. No hypermetabolic pulmonary nodules evident. Incidental CT findings: none ABDOMEN/PELVIS: No abnormal metabolic activity in liver. No abnormal metabolic activity associated with the RIGHT adrenal gland which is low attenuation consistent with benign adenoma. No hypermetabolic abdominal or pelvic lymph nodes. Specifically no metabolic activity associated with the small periaortic retroperitoneal lymph nodes. No abnormal activity in the pancreas. There is a diffuse metabolic activity associated with the small bowel and colon without focality. No evidence of bowel obstruction. Incidental CT findings: Bilateral small  hydroceles. SKELETON: No evidence skeletal metastasis.  Incidental CT findings: none IMPRESSION: 1. Hypermetabolic mediastinal lymph nodes consistent with metastatic mediastinal adenopathy. 2. No significant metabolic activity associated with small RIGHT lower lobe pulmonary nodule. This nodule remains indeterminate. 3. No primary malignancy identified on skull base to thigh PET-CT scan. 4. Hypermetabolic brain metastasis noted. Electronically Signed   By: Suzy Bouchard M.D.   On: 08/26/2020 09:51   MR ORBITS W WO CONTRAST  Result Date: 08/18/2020 CLINICAL DATA:  Optic nerve edema. EXAM: MRI HEAD AND ORBITS WITHOUT AND WITH CONTRAST TECHNIQUE: Multiplanar, multiecho pulse sequences of the brain and surrounding structures were obtained without and with intravenous contrast. Multiplanar, multiecho pulse sequences of the orbits and surrounding structures were obtained including fat saturation techniques, before and after intravenous contrast administration. CONTRAST:  11mL GADAVIST GADOBUTROL 1 MMOL/ML IV SOLN COMPARISON:  None. FINDINGS: MRI HEAD FINDINGS Brain: Multiple enhancing lesions are seen scattered throughout the bilateral cerebellar and cerebral hemispheres, concerning for metastatic disease. Lesions show a peripheral rim enhancement, some of them with a target appearance. At least 16 lesions are identified. The largest lesions are seen in the left cerebellar hemisphere (22 mm), right temporal lobe (26 mm) and left occipital lobe (29 mm) with prominent surrounding vasogenic edema causing effacement of the adjacent cerebral sulci. No hydrocephalus, hemorrhage, acute infarct or midline shift. Vascular: Normal flow voids. Skull and upper cervical spine: Normal marrow signal. Other: Right mastoid effusion. MRI ORBITS FINDINGS Orbits: Bilateral lens surgery. Left scleral buckle surgery. Flattening of the bilateral optic disc, right greater than, likely related to increased intracranial pressure. Optic nerves are otherwise maintained. Orbital fat, extra ocular  muscles and vascular structures appear preserved. Optic chiasm, cavernous sinus and suprasellar cistern are unremarkable. Visualized sinuses: Trace mucosal thickening scattered throughout the paranasal sinuses Soft tissues: Negative. IMPRESSION: 1. Multiple enhancing lesions scattered throughout the bilateral cerebellar and cerebral hemispheres, concerning for metastatic disease. The largest lesions are seen in the left cerebellar hemisphere (22 mm), right temporal lobe (26 mm) and left occipital lobe (29 mm) with prominent surrounding vasogenic edema causing effacement of the adjacent cerebral sulci. 2. Flattening of the bilateral optic disc, right greater than left, likely related to increased intracranial pressure. 3. Right mastoid effusion. These results will be called to the ordering clinician or representative by the Radiologist Assistant, and communication documented in the PACS or [employer]. Electronically Signed   By: Pedro Earls M.D.   On: 08/18/2020 21:07    ASSESSMENT & PLAN:   Brain metastasis ([place]) #Multiple brain lesions largest  up to 22 mm with surrounding edema-highly concerning for metastatic disease to the brain.PET scan- JAN 25th, 2022-Hypermetabolic mediastinal lymph nodes;  No significant metabolic activity associated with small RIGHT lower lobe pulmonary nodule. This nodule remains indeterminate;  No primary malignancy identified on skull base to thigh PET-CT Scan; Hypermetabolic brain metastasis noted; bladder nodules [on CT scan]-see below.  #Mediastinal adenopathy hypermetabolic-again concerning for metastatic disease question primary.  Discussed with Dr.Aleskerov-patient awaiting EBUS biopsy on 1/31.  Discussed that systemic options based upon results of biopsy.  #Multiple brain metastases-status post evaluation with Dr. Donella Stade.  Awaiting start radiation; dexamethasone 2 mg twice daily; Keppra.Take dex 2 mg a day given no symtoms/elevated BG  .  #Bladder nodules noted on CT scan-no evidence of anemia/hematuria.  Await mediastinal lymph node biopsy for further work-up.  #Right colon mass-colonoscopy approximately 3 to 4 years ago.  Discussed with Dr. Vicente Males; plan colonoscopy this week.  Especially given elevated CEA.  #Diabetes on metformin-on dex 2mg  BID [ FBS- 170; PP- 248];  # DISPOSITION: # Follow up in 1 week;MD;  labs cbc/bmp-dr.B  # I reviewed the blood work- with the patient in detail; also reviewed the imaging independently [as summarized above]; and with the patient in detail.       All questions were answered. The patient knows to call the clinic with any problems, questions or concerns.    Cammie Sickle, MD 09/01/2020 8:44 AM

## 2020-08-29 NOTE — Patient Instructions (Signed)
#   take dexamethasone 2 mg once a day.

## 2020-08-30 LAB — SARS CORONAVIRUS 2 (TAT 6-24 HRS): SARS Coronavirus 2: NEGATIVE

## 2020-09-01 ENCOUNTER — Ambulatory Visit: Payer: Medicare PPO | Admitting: Urgent Care

## 2020-09-01 ENCOUNTER — Encounter: Payer: Self-pay | Admitting: Certified Registered Nurse Anesthetist

## 2020-09-01 ENCOUNTER — Telehealth: Payer: Self-pay | Admitting: *Deleted

## 2020-09-01 ENCOUNTER — Ambulatory Visit
Admission: RE | Admit: 2020-09-01 | Discharge: 2020-09-01 | Disposition: A | Discharge status: Home / Self Care | Payer: Medicare PPO | Source: Ambulatory Visit | Attending: Radiation Oncology | Admitting: Radiation Oncology

## 2020-09-01 ENCOUNTER — Encounter
Admission: RE | Disposition: A | Discharge status: Home / Self Care | Payer: Self-pay | Source: Home / Self Care | Attending: Pulmonary Disease

## 2020-09-01 ENCOUNTER — Ambulatory Visit
Admission: RE | Admit: 2020-09-01 | Discharge: 2020-09-01 | Disposition: A | Discharge status: Home / Self Care | Payer: Medicare PPO | Attending: Pulmonary Disease | Admitting: Pulmonary Disease

## 2020-09-01 ENCOUNTER — Telehealth: Payer: Self-pay | Admitting: Gastroenterology

## 2020-09-01 ENCOUNTER — Other Ambulatory Visit: Payer: Self-pay

## 2020-09-01 DIAGNOSIS — C7931 Secondary malignant neoplasm of brain: Secondary | ICD-10-CM | POA: Insufficient documentation

## 2020-09-01 DIAGNOSIS — Z87891 Personal history of nicotine dependence: Secondary | ICD-10-CM | POA: Insufficient documentation

## 2020-09-01 DIAGNOSIS — Z510 Encounter for antineoplastic radiation therapy: Secondary | ICD-10-CM | POA: Insufficient documentation

## 2020-09-01 DIAGNOSIS — Z01812 Encounter for preprocedural laboratory examination: Secondary | ICD-10-CM | POA: Diagnosis not present

## 2020-09-01 DIAGNOSIS — Z888 Allergy status to other drugs, medicaments and biological substances status: Secondary | ICD-10-CM | POA: Diagnosis not present

## 2020-09-01 DIAGNOSIS — Z20822 Contact with and (suspected) exposure to covid-19: Secondary | ICD-10-CM | POA: Insufficient documentation

## 2020-09-01 DIAGNOSIS — C771 Secondary and unspecified malignant neoplasm of intrathoracic lymph nodes: Secondary | ICD-10-CM | POA: Diagnosis not present

## 2020-09-01 DIAGNOSIS — R590 Localized enlarged lymph nodes: Secondary | ICD-10-CM | POA: Diagnosis present

## 2020-09-01 DIAGNOSIS — C801 Malignant (primary) neoplasm, unspecified: Secondary | ICD-10-CM | POA: Insufficient documentation

## 2020-09-01 HISTORY — DX: Malignant (primary) neoplasm, unspecified: C80.1

## 2020-09-01 HISTORY — PX: VIDEO BRONCHOSCOPY WITH ENDOBRONCHIAL ULTRASOUND: SHX6177

## 2020-09-01 LAB — GLUCOSE, CAPILLARY
Glucose-Capillary: 182 mg/dL — ABNORMAL HIGH (ref 70–99)
Glucose-Capillary: 183 mg/dL — ABNORMAL HIGH (ref 70–99)

## 2020-09-01 SURGERY — BRONCHOSCOPY, WITH EBUS
Anesthesia: General

## 2020-09-01 MED ORDER — DEXAMETHASONE SODIUM PHOSPHATE 10 MG/ML IJ SOLN
INTRAMUSCULAR | Status: AC
  Filled 2020-09-01: qty 1

## 2020-09-01 MED ORDER — FENTANYL CITRATE (PF) 100 MCG/2ML IJ SOLN
25.0000 ug | INTRAMUSCULAR | Status: DC | PRN
Start: 2020-09-01 — End: 2020-09-01

## 2020-09-01 MED ORDER — GLYCOPYRROLATE 0.2 MG/ML IJ SOLN
INTRAMUSCULAR | Status: AC
  Filled 2020-09-01: qty 1

## 2020-09-01 MED ORDER — ROCURONIUM BROMIDE 10 MG/ML (PF) SYRINGE
PREFILLED_SYRINGE | INTRAVENOUS | Status: AC
  Filled 2020-09-01: qty 10

## 2020-09-01 MED ORDER — ONDANSETRON HCL 4 MG/2ML IJ SOLN: 4.0000 mg | Freq: Once | INTRAMUSCULAR | Status: DC | PRN

## 2020-09-01 MED ORDER — ROCURONIUM BROMIDE 100 MG/10ML IV SOLN
INTRAVENOUS | Status: DC | PRN
  Administered 2020-09-01: 40 mg via INTRAVENOUS
  Administered 2020-09-01: 10 mg via INTRAVENOUS

## 2020-09-01 MED ORDER — PHENYLEPHRINE HCL 0.25 % NA SOLN
1.0000 | Freq: Four times a day (QID) | NASAL | Status: DC | PRN
  Filled 2020-09-01: qty 15

## 2020-09-01 MED ORDER — SUGAMMADEX SODIUM 200 MG/2ML IV SOLN
INTRAVENOUS | Status: DC | PRN
  Administered 2020-09-01: 100 mg via INTRAVENOUS
  Administered 2020-09-01: 200 mg via INTRAVENOUS

## 2020-09-01 MED ORDER — SUCCINYLCHOLINE CHLORIDE 200 MG/10ML IV SOSY
PREFILLED_SYRINGE | INTRAVENOUS | Status: AC
  Filled 2020-09-01: qty 10

## 2020-09-01 MED ORDER — DEXAMETHASONE SODIUM PHOSPHATE 10 MG/ML IJ SOLN
INTRAMUSCULAR | Status: DC | PRN
  Administered 2020-09-01: 10 mg via INTRAVENOUS

## 2020-09-01 MED ORDER — ORAL CARE MOUTH RINSE: 15.0000 mL | Freq: Once | OROMUCOSAL | Status: AC

## 2020-09-01 MED ORDER — DEXMEDETOMIDINE (PRECEDEX) IN NS 20 MCG/5ML (4 MCG/ML) IV SYRINGE
PREFILLED_SYRINGE | INTRAVENOUS | Status: DC | PRN
  Administered 2020-09-01 (×2): 4 ug via INTRAVENOUS

## 2020-09-01 MED ORDER — BUTAMBEN-TETRACAINE-BENZOCAINE 2-2-14 % EX AERO
1.0000 | INHALATION_SPRAY | Freq: Once | CUTANEOUS | Status: DC
  Filled 2020-09-01: qty 20

## 2020-09-01 MED ORDER — EPHEDRINE SULFATE 50 MG/ML IJ SOLN
INTRAMUSCULAR | Status: DC | PRN
  Administered 2020-09-01 (×3): 5 mg via INTRAVENOUS

## 2020-09-01 MED ORDER — FENTANYL CITRATE (PF) 100 MCG/2ML IJ SOLN
INTRAMUSCULAR | Status: DC | PRN
  Administered 2020-09-01: 25 ug via INTRAVENOUS
  Administered 2020-09-01: 50 ug via INTRAVENOUS
  Administered 2020-09-01: 25 ug via INTRAVENOUS

## 2020-09-01 MED ORDER — LIDOCAINE HCL (PF) 1 % IJ SOLN
30.0000 mL | Freq: Once | INTRAMUSCULAR | Status: DC
  Filled 2020-09-01: qty 30

## 2020-09-01 MED ORDER — LIDOCAINE HCL (PF) 2 % IJ SOLN
INTRAMUSCULAR | Status: AC
  Filled 2020-09-01: qty 5

## 2020-09-01 MED ORDER — FENTANYL CITRATE (PF) 100 MCG/2ML IJ SOLN
INTRAMUSCULAR | Status: AC
  Filled 2020-09-01: qty 2

## 2020-09-01 MED ORDER — LIDOCAINE HCL (CARDIAC) PF 100 MG/5ML IV SOSY
PREFILLED_SYRINGE | INTRAVENOUS | Status: DC | PRN
  Administered 2020-09-01: 100 mg via INTRAVENOUS

## 2020-09-01 MED ORDER — ONDANSETRON HCL 4 MG/2ML IJ SOLN
INTRAMUSCULAR | Status: AC
  Filled 2020-09-01: qty 2

## 2020-09-01 MED ORDER — CHLORHEXIDINE GLUCONATE 0.12 % MT SOLN
15.0000 mL | Freq: Once | OROMUCOSAL | Status: AC
  Administered 2020-09-01: 15 mL via OROMUCOSAL

## 2020-09-01 MED ORDER — GLYCOPYRROLATE 0.2 MG/ML IJ SOLN
INTRAMUSCULAR | Status: DC | PRN
  Administered 2020-09-01: .2 mg via INTRAVENOUS

## 2020-09-01 MED ORDER — LIDOCAINE HCL URETHRAL/MUCOSAL 2 % EX GEL
1.0000 "application " | Freq: Once | CUTANEOUS | Status: DC
  Filled 2020-09-01: qty 5

## 2020-09-01 MED ORDER — SUCCINYLCHOLINE CHLORIDE 20 MG/ML IJ SOLN
INTRAMUSCULAR | Status: DC | PRN
  Administered 2020-09-01: 110 mg via INTRAVENOUS

## 2020-09-01 MED ORDER — SODIUM CHLORIDE 0.9 % IV SOLN
INTRAVENOUS | Status: DC
  Administered 2020-09-01: 10 mL/h via INTRAVENOUS

## 2020-09-01 MED ORDER — PROPOFOL 10 MG/ML IV BOLUS
INTRAVENOUS | Status: DC | PRN
  Administered 2020-09-01: 200 mg via INTRAVENOUS

## 2020-09-01 MED ORDER — ONDANSETRON HCL 4 MG/2ML IJ SOLN
INTRAMUSCULAR | Status: DC | PRN
  Administered 2020-09-01: 4 mg via INTRAVENOUS

## 2020-09-01 MED ORDER — DEXMEDETOMIDINE (PRECEDEX) IN NS 20 MCG/5ML (4 MCG/ML) IV SYRINGE
PREFILLED_SYRINGE | INTRAVENOUS | Status: AC
  Filled 2020-09-01: qty 5

## 2020-09-01 NOTE — H&P (Signed)
Pulmonary Medicine          Date: 09/01/2020,   MRN# [id number] [name] [birth year]     AdmissionWeight: 93.4 kg                 CurrentWeight: 93.4 kg      CHIEF COMPLAINT:   Hilar and mediastinal lymphadenopathy   HISTORY OF PRESENT ILLNESS   Alejandro Randolph is 75 y.o. male who was referred to [facility] due to diagnosis of cancer. Patient is currently being seen by oncology with findings of multiple brain lesions largest up to 22 mm with surrounding edema concerning for metastatic disease. Currently is ongoing treatment includes steroids and ongoing evaluation with rad/onc He had CT imaging as well as PET scan with findings of hypermetabolic right lower paratracheal lymph nodes and subcarinal lymph node. He is here today to discuss having biopsy on hilar/mediastinal PET avid lymph nodes the endobronchial ultrasound assisted bronchoscopy. Imaging was reviewed with patient and is included in pictorial documentation below.  He is a former smoker but quit many years ago, his spirometry shows preserved function without COPD. He is walking well without dyspnea with mMRC 1 a this time and no coughing.   Patient takes asa 90 daily and will stop today.   We reviewed imaging together and PET scan. We discussed biopsy and EBUS.   Patient has never had COVID infection and is tripple vaccinated for COVID.  SPIROMETRIC DATA: 08/28/20- FEV1- 2.60L - 86.5     PAST MEDICAL HISTORY   Past Medical History:  Diagnosis Date  . Abscess of sigmoid colon   . Diabetes mellitus without complication ([place])   . GERD (gastroesophageal reflux disease)   . Hypercholesteremia   . Hypertension   . Peripheral vascular disease ([place])   . PUD (peptic ulcer disease)      SURGICAL HISTORY   Past Surgical History:  Procedure Laterality Date  . CHOLECYSTECTOMY    . COLONOSCOPY WITH PROPOFOL N/A 07/19/2016   Procedure: COLONOSCOPY WITH PROPOFOL;  Surgeon: Jonathon Bellows, MD;   Location: [facility] ORS;  Service: Endoscopy;  Laterality: N/A;  . COLONOSCOPY WITH PROPOFOL N/A 09/02/2016   Procedure: COLONOSCOPY WITH PROPOFOL;  Surgeon: Jonathon Bellows, MD;  Location: [facility] ENDOSCOPY;  Service: Endoscopy;  Laterality: N/A;  . femoral stents Bilateral   . FLEXIBLE SIGMOIDOSCOPY  07/19/2016   Procedure: FLEXIBLE SIGMOIDOSCOPY;  Surgeon: Jonathon Bellows, MD;  Location: [facility] OR[facility];  Service: Endoscopy;;  . HERNIA REPAIR    . RETINAL DETACHMENT SURGERY    . TONSILLECTOMY       FAMILY HISTORY   Family History  Problem Relation Age of Onset  . Diabetes Mother   . Hypertension Mother   . Diabetes Father      SOCIAL HISTORY   Social History   Tobacco Use  . Smoking status: Former Smoker    Quit date: 2001    Years since quitting: 21.0  . Smokeless tobacco: Never Used  Vaping Use  . Vaping Use: Never used  Substance Use Topics  . Alcohol use: No  . Drug use: No     MEDICATIONS    Home Medication:    Current Medication:  Current Facility-Administered Medications:  .  0.9 %  sodium chloride infusion, , Intravenous, Continuous, Ramsdell, Brett Canales, MD .  butamben-tetracaine-benzocaine (CETACAINE) spray 1 spray, 1 spray, Topical, Once, Nyjah Denio, MD .  chlorhexidine (PERIDEX) 0.12 % solution 15 mL, 15 mL, Mouth/Throat, Once **OR** MEDLINE mouth  rinse, 15 mL, Mouth Rinse, Once, Ramsdell, Brett Canales, MD .  lidocaine (PF) (XYLOCAINE) 1 % injection 30 mL, 30 mL, Infiltration, Once, Galdino Hinchman, MD .  lidocaine (XYLOCAINE) 2 % jelly 1 application, 1 application, Topical, Once, Javaya Oregon, MD .  phenylephrine (NEO-SYNEPHRINE) 0.25 % nasal spray 1 spray, 1 spray, Each Nare, Q6H PRN, Ottie Glazier, MD    ALLERGIES   Atorvastatin     REVIEW OF SYSTEMS    Review of Systems:  Gen:  Denies  fever, sweats, chills weigh loss  HEENT: Denies blurred vision, double vision, ear pain, eye pain, hearing loss, nose bleeds, sore throat Cardiac:  No dizziness,  chest pain or heaviness, chest tightness,edema Resp:   Denies cough or sputum porduction, shortness of breath,wheezing, hemoptysis,  Gi: Denies swallowing difficulty, stomach pain, nausea or vomiting, diarrhea, constipation, bowel incontinence Gu:  Denies bladder incontinence, burning urine Ext:   Denies Joint pain, stiffness or swelling Skin: Denies  skin rash, easy bruising or bleeding or hives Endoc:  Denies polyuria, polydipsia , polyphagia or weight change Psych:   Denies depression, insomnia or hallucinations   Other:  All other systems negative   VS: BP 135/68   Pulse 60   Temp 97.9 F (36.6 C) (Oral)   Resp 15   Ht 5\' 9"  (1.753 m)   Wt 93.4 kg   SpO2 99%   BMI 30.42 kg/m      PHYSICAL EXAM    GENERAL:NAD, no fevers, chills, no weakness no fatigue HEAD: Normocephalic, atraumatic.  EYES: Pupils equal, round, reactive to light. Extraocular muscles intact. No scleral icterus.  MOUTH: Moist mucosal membrane. Dentition intact. No abscess noted.  EAR, NOSE, THROAT: Clear without exudates. No external lesions.  NECK: Supple. No thyromegaly. No nodules. No JVD.  PULMONARY: clear to auscultation CARDIOVASCULAR: S1 and S2. Regular rate and rhythm. No murmurs, rubs, or gallops. No edema. Pedal pulses 2+ bilaterally.  GASTROINTESTINAL: Soft, nontender, nondistended. No masses. Positive bowel sounds. No hepatosplenomegaly.  MUSCULOSKELETAL: No swelling, clubbing, or edema. Range of motion full in all extremities.  NEUROLOGIC: Cranial nerves II through XII are intact. No gross focal neurological deficits. Sensation intact. Reflexes intact.  SKIN: No ulceration, lesions, rashes, or cyanosis. Skin warm and dry. Turgor intact.  PSYCHIATRIC: Mood, affect within normal limits. The patient is awake, alert and oriented x 3. Insight, judgment intact.       IMAGING    MR BRAIN W WO CONTRAST  Result Date: 08/18/2020 CLINICAL DATA:  Optic nerve edema. EXAM: MRI HEAD AND ORBITS WITHOUT  AND WITH CONTRAST TECHNIQUE: Multiplanar, multiecho pulse sequences of the brain and surrounding structures were obtained without and with intravenous contrast. Multiplanar, multiecho pulse sequences of the orbits and surrounding structures were obtained including fat saturation techniques, before and after intravenous contrast administration. CONTRAST:  70mL GADAVIST GADOBUTROL 1 MMOL/ML IV SOLN COMPARISON:  None. FINDINGS: MRI HEAD FINDINGS Brain: Multiple enhancing lesions are seen scattered throughout the bilateral cerebellar and cerebral hemispheres, concerning for metastatic disease. Lesions show a peripheral rim enhancement, some of them with a target appearance. At least 16 lesions are identified. The largest lesions are seen in the left cerebellar hemisphere (22 mm), right temporal lobe (26 mm) and left occipital lobe (29 mm) with prominent surrounding vasogenic edema causing effacement of the adjacent cerebral sulci. No hydrocephalus, hemorrhage, acute infarct or midline shift. Vascular: Normal flow voids. Skull and upper cervical spine: Normal marrow signal. Other: Right mastoid effusion. MRI ORBITS FINDINGS Orbits: Bilateral lens surgery.  Left scleral buckle surgery. Flattening of the bilateral optic disc, right greater than, likely related to increased intracranial pressure. Optic nerves are otherwise maintained. Orbital fat, extra ocular muscles and vascular structures appear preserved. Optic chiasm, cavernous sinus and suprasellar cistern are unremarkable. Visualized sinuses: Trace mucosal thickening scattered throughout the paranasal sinuses Soft tissues: Negative. IMPRESSION: 1. Multiple enhancing lesions scattered throughout the bilateral cerebellar and cerebral hemispheres, concerning for metastatic disease. The largest lesions are seen in the left cerebellar hemisphere (22 mm), right temporal lobe (26 mm) and left occipital lobe (29 mm) with prominent surrounding vasogenic edema causing effacement  of the adjacent cerebral sulci. 2. Flattening of the bilateral optic disc, right greater than left, likely related to increased intracranial pressure. 3. Right mastoid effusion. These results will be called to the ordering clinician or representative by the Radiologist Assistant, and communication documented in the PACS or [employer]. Electronically Signed   By: Pedro Earls M.D.   On: 08/18/2020 21:07   CT CHEST ABDOMEN PELVIS W CONTRAST  Addendum Date: 08/22/2020   ADDENDUM REPORT: 08/22/2020 10:18 ADDENDUM: A comparison CT from 07/10/2016 was made available. The RIGHT adrenal gland lesion is stable. 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 Additionally, there is nodularity within the bladder on comparison CT from 2017. This lowers the suspicion bladder neoplasm but does not exclude neoplasm. Additionally, the retroperitoneal nodes described on current CT or are also stable compared to 07/10/2016. The patient may benefit from outpatient FDG PET scan to identify source of presumed brain metastasis. Electronically Signed   By: Suzy Bouchard M.D.   On: 08/22/2020 10:18   Result Date: 08/22/2020 CLINICAL DATA:  Brain lesions.  Concern for brain metastasis. EXAM: CT CHEST, ABDOMEN, AND PELVIS WITH CONTRAST TECHNIQUE: Multidetector CT imaging of the chest, abdomen and pelvis was performed following the standard protocol during bolus administration of intravenous contrast. CONTRAST:  146mL OMNIPAQUE IOHEXOL 300 MG/ML  SOLN COMPARISON:  None. FINDINGS: CT CHEST FINDINGS Cardiovascular: No significant vascular findings. Normal heart size. No pericardial effusion. Mediastinum/Nodes: Enlarged RIGHT paratracheal node measures 15 mm. Enlarged subcarinal node measures 11 mm. Lungs/Pleura: 7 mm nodule in the RIGHT lower lobe (image 56/4). Musculoskeletal: No aggressive osseous lesion. CT ABDOMEN AND PELVIS FINDINGS Hepatobiliary: No  focal hepatic lesion. Postcholecystectomy. No biliary dilatation. Pancreas: Pancreas is normal. No ductal dilatation. No pancreatic inflammation. Spleen: Normal spleen Adrenals/urinary tract: Rounded nodule of the RIGHT adrenal gland measures 2.4 cm. LEFT adrenal gland normal. No enhancing renal cortical lesion. Bilateral simple fluid attenuation renal cysts. Extrarenal pelvis on the LEFT. Enhancing focus of flattened enhancing tissue posterior LEFT aspect bladder measures 13 mm by 4 mm (image 29/2) Stomach/Bowel: Large hiatal hernia. Small bowel normal. Cecum normal. Appendix not identified. The colon and rectosigmoid colon are normal. Vascular/Lymphatic: Abdominal aorta is normal in caliber with intimal calcification. Rounded node in the retroperitoneum adjacent to the LEFT ureter measures 6 mm (image 83/2. Small the RIGHT common iliac node measures 6 mm on image 89/2. Small LEFT external iliac lymph node measures 7 mm image 22. Reproductive: Prostate enlarged to 16 mm Other: There is peritoneal metastasis. Musculoskeletal: No aggressive osseous lesion. IMPRESSION: Chest Impression: 1. Single pulmonary nodule in the RIGHT lower lobe is concerning for solitary pulmonary metastasis. 2. Metastatic mediastinal lymphadenopathy. Abdomen / Pelvis Impression: 1. Flattened enhancing lesion in the posterior LEFT bladder is concerning for bladder neoplasm. 2. Several subcentimeter rounded lymph nodes in the LEFT retroperitoneum  and along the iliac vessels are not pathologic by size criteria but concerning for nodal metastasis. 3. Enlarged RIGHT adrenal gland concerning for RIGHT adrenal metastasis. Electronically Signed: By: Suzy Bouchard M.D. On: 08/21/2020 18:20   NM PET Image Initial (PI) Skull Base To Thigh  Result Date: 08/26/2020 CLINICAL DATA:  Initial treatment strategy for malignancy of unknown primary. Brain metastasis. EXAM: NUCLEAR MEDICINE PET SKULL BASE TO THIGH TECHNIQUE: 11.0 mCi F-18 FDG was injected  intravenously. Full-ring PET imaging was performed from the skull base to thigh after the radiotracer. CT data was obtained and used for attenuation correction and anatomic localization. Fasting blood glucose: 134 mg/dl COMPARISON:  CT 08/21/2020 FINDINGS: Mediastinal blood pool activity: SUV max Liver activity: SUV max NA NECK: Hypermetabolic lesion in the LEFT cerebellum corresponds of brain metastasis on comparison MRI. Additional lesion in the RIGHT temporal lobe and LEFT occipital lobe Incidental CT findings: none CHEST: Hypermetabolic RIGHT lower paratracheal lymph node measures 13 mm short axis with SUV max equal 5.4. Hypermetabolic subcarinal lymph node SUV max equal 7.1 Small subpleural nodule in the RIGHT lower lobe measures 6 mm does not have significant metabolic activity. No hypermetabolic pulmonary nodules evident. Incidental CT findings: none ABDOMEN/PELVIS: No abnormal metabolic activity in liver. No abnormal metabolic activity associated with the RIGHT adrenal gland which is low attenuation consistent with benign adenoma. No hypermetabolic abdominal or pelvic lymph nodes. Specifically no metabolic activity associated with the small periaortic retroperitoneal lymph nodes. No abnormal activity in the pancreas. There is a diffuse metabolic activity associated with the small bowel and colon without focality. No evidence of bowel obstruction. Incidental CT findings: Bilateral small  hydroceles. SKELETON: No evidence skeletal metastasis. Incidental CT findings: none IMPRESSION: 1. Hypermetabolic mediastinal lymph nodes consistent with metastatic mediastinal adenopathy. 2. No significant metabolic activity associated with small RIGHT lower lobe pulmonary nodule. This nodule remains indeterminate. 3. No primary malignancy identified on skull base to thigh PET-CT scan. 4. Hypermetabolic brain metastasis noted. Electronically Signed   By: Suzy Bouchard M.D.   On: 08/26/2020 09:51   MR ORBITS W WO  CONTRAST  Result Date: 08/18/2020 CLINICAL DATA:  Optic nerve edema. EXAM: MRI HEAD AND ORBITS WITHOUT AND WITH CONTRAST TECHNIQUE: Multiplanar, multiecho pulse sequences of the brain and surrounding structures were obtained without and with intravenous contrast. Multiplanar, multiecho pulse sequences of the orbits and surrounding structures were obtained including fat saturation techniques, before and after intravenous contrast administration. CONTRAST:  80mL GADAVIST GADOBUTROL 1 MMOL/ML IV SOLN COMPARISON:  None. FINDINGS: MRI HEAD FINDINGS Brain: Multiple enhancing lesions are seen scattered throughout the bilateral cerebellar and cerebral hemispheres, concerning for metastatic disease. Lesions show a peripheral rim enhancement, some of them with a target appearance. At least 16 lesions are identified. The largest lesions are seen in the left cerebellar hemisphere (22 mm), right temporal lobe (26 mm) and left occipital lobe (29 mm) with prominent surrounding vasogenic edema causing effacement of the adjacent cerebral sulci. No hydrocephalus, hemorrhage, acute infarct or midline shift. Vascular: Normal flow voids. Skull and upper cervical spine: Normal marrow signal. Other: Right mastoid effusion. MRI ORBITS FINDINGS Orbits: Bilateral lens surgery. Left scleral buckle surgery. Flattening of the bilateral optic disc, right greater than, likely related to increased intracranial pressure. Optic nerves are otherwise maintained. Orbital fat, extra ocular muscles and vascular structures appear preserved. Optic chiasm, cavernous sinus and suprasellar cistern are unremarkable. Visualized sinuses: Trace mucosal thickening scattered throughout the paranasal sinuses Soft tissues: Negative. IMPRESSION: 1. Multiple enhancing lesions  scattered throughout the bilateral cerebellar and cerebral hemispheres, concerning for metastatic disease. The largest lesions are seen in the left cerebellar hemisphere (22 mm), right temporal  lobe (26 mm) and left occipital lobe (29 mm) with prominent surrounding vasogenic edema causing effacement of the adjacent cerebral sulci. 2. Flattening of the bilateral optic disc, right greater than left, likely related to increased intracranial pressure. 3. Right mastoid effusion. These results will be called to the ordering clinician or representative by the Radiologist Assistant, and communication documented in the PACS or [employer]. Electronically Signed   By: Pedro Earls M.D.   On: 08/18/2020 21:07      ASSESSMENT/PLAN   Hilar and mediastinal lymphadenopathy Concerning for metastatic malignant disease -Plan for tissue biopsy via endobronchial ultrasound assisted Reviewed procedure with patient and answered questions -Reviewed risks/complications and benefits with patient. Risks include infection, pneumothorax/pneumomediastinum which may require chest tube placement as well as overnight/prolonged hospitalization and possible mechanical ventilation. Other risks include bleeding and very rarely death. Patient understands risks and wishes to proceed. Additional questions were answered, and patient is aware that post procedure patient will be going home with family and may experience cough with possible clots on expectoration as well as phlegm which may last few days as well as hoarseness of voice post intubation and mechanical ventilation. -patient stopped taking ASA >7d ago.         Thank you for allowing me to participate in the care of this patient.   Patient/Family are satisfied with care plan and all questions have been answered.  This document was prepared using Dragon voice recognition software and may include unintentional dictation errors.     Ottie Glazier, M.D.  Division of [place]

## 2020-09-01 NOTE — Anesthesia Postprocedure Evaluation (Signed)
Anesthesia Post Note  Patient: Alejandro Randolph  Procedure(s) Performed: VIDEO BRONCHOSCOPY WITH ENDOBRONCHIAL ULTRASOUND (N/A )  Patient location during evaluation: PACU Anesthesia Type: General Level of consciousness: awake and alert Pain management: pain level controlled Vital Signs Assessment: post-procedure vital signs reviewed and stable Respiratory status: spontaneous breathing and respiratory function stable Cardiovascular status: stable Anesthetic complications: no   No complications documented.   Last Vitals:  Vitals:   09/01/20 1345 09/01/20 1348  BP: (!) 128/56 (!) 128/56  Pulse: 60 (!) 59  Resp: 12 14  Temp: 36.4 C   SpO2: 93% 97%    Last Pain:  Vitals:   09/01/20 1345  TempSrc:   PainSc: 0-No pain                 KEPHART,WILLIAM K

## 2020-09-01 NOTE — Procedures (Signed)
FIBEROPTIC BRONCHOSCOPY WITH BRONCHOALVEOLAR LAVAGE PROCEDURE NOTE  ENDOBRONCHIAL ULTRASOUND PROCEDURE NOTE    Flexible bronchoscopy was performed  by : Lanney Gins MD  assistance by : 1)Repiratory therapist  and 2)[employer] staff and 3) Anesthesia team and 4) Flouroscopy team and 5) [employer] supporting staff   Indication for the procedure was :  Pre-procedural H&P. The following assessment was performed on the day of the procedure prior to initiating sedation History:  Chest pain n Dyspnea y Hemoptysis n Cough y Fever n Other pertinent items n  Examination Vital signs -reviewed as per nursing documentation today Cardiac    Murmurs: n  Rubs : n  Gallop: n Lungs Wheezing: n Rales : n Rhonchi :y  Other pertinent findings: SOB/hypoxemia due to chronic lung disease   Pre-procedural assessment for Procedural Sedation included: Depth of sedation: As per anesthesia team  ASA Classification:  2 Mallampati airway assessment: 3    Medication list reviewed: y  The patient's interval history was taken and revealed: no new complaints The pre- procedure physical examination revealed: No new findings Refer to prior clinic note for details.  Informed Consent: Informed consent was obtained from:  patient after explanation of procedure and risks, benefits, as well as alternative procedures available.  Explanation of level of sedation and possible transfusion was also provided.    Procedural Preparation: Time out was performed and patient was identified by name and birthdate and procedure to be performed and side for sampling, if any, was specified. Pt was intubated by anesthesia.  The patient was appropriately draped.   Fiberoptic bronchoscopy with airway inspection and BAL Procedure findings:  Bronchoscope was inserted via ETT  without difficulty.  Posterior oropharynx, epiglottis, arytenoids, false cords and vocal cords were not visualized as these were bypassed  by endotracheal tube. The distal trachea was normal in circumference and appearance without mucosal, cartilaginous or branching abnormalities.  The main carina was mildly splayed . All right and left lobar airways were visualized to the Subsegmental level.  Sub- sub segmental carinae were identified in all the distal airways.   Secretions were visible in the following airways and appeared to be clear.  The mucosa was : normal airway inspection  Airways were notable for:        exophytic lesions :n       extrinsic compression in the following distributions: n.       Friable mucosa: n       Anthrocotic material /pigmentation: n     Post procedure Diagnosis:   Normal airway inspection         Endobronchial ultrasound assisted hilar and mediastinal lymph node biopsies procedure findings: The fiberoptic bronchoscope was removed and the EBUS scope was introduced. Examination began to evaluate for pathologically enlarged lymph nodes starting on the Left side progressing to the right side.  All lymph node biopsies performed with 21G needle. Lymph node biopsies were sent in cytolite for all stations.  Station 10L - 0.9cm -1 pass - lymphoid shedding normal Station 7 - 1.8cm 4 pass - carcinoma  Station 4R - 1.5cm 4 pass - carcinoma  Post procedure diagnosis:  Carcinoma    Specimens obtained included:                                      Broncho-alveolar lavage site:RML  sent for cytology  18m volume infused 30 ml volume returned with cellular  appearance    Fluoroscopy Used: none ;        Pictorial documentation attached: none     Immediate sampling complications included:none  Epinephrine zero ml was used topically  The bronchoscopy was terminated due to completion of the planned procedure and the bronchoscope was removed.   Total dosage of Lidocaine was zero mg Total fluoroscopy time was zero  minutes    Estimated Blood loss:  3cc.    Disposition: home with family   Follow up with Dr. ALanney Ginsin 5 days for result discussion.     FOttie GlazierMD  KPettusDivision of Pulmonary & Critical Care Medicine

## 2020-09-01 NOTE — Anesthesia Procedure Notes (Signed)
Procedure Name: Intubation Date/Time: 09/01/2020 12:55 PM Performed by: Lily Peer, Nychelle Cassata, CRNA Pre-anesthesia Checklist: Patient identified, Emergency Drugs available, Suction available and Patient being monitored Patient Re-evaluated:Patient Re-evaluated prior to induction Oxygen Delivery Method: Circle system utilized Preoxygenation: Pre-oxygenation with 100% oxygen Induction Type: IV induction Ventilation: Mask ventilation without difficulty Laryngoscope Size: McGraph and 4 Grade View: Grade I Tube type: Oral Tube size: 9.0 mm Number of attempts: 1 Airway Equipment and Method: Stylet Placement Confirmation: ETT inserted through vocal cords under direct vision,  positive ETCO2 and breath sounds checked- equal and bilateral Secured at: 22 cm Tube secured with: Tape Dental Injury: Teeth and Oropharynx as per pre-operative assessment

## 2020-09-01 NOTE — Discharge Instructions (Signed)

## 2020-09-01 NOTE — Anesthesia Preprocedure Evaluation (Signed)
Anesthesia Evaluation  Patient identified by MRN, date of birth, ID band Patient awake    Reviewed: Allergy & Precautions, NPO status , Patient's Chart, lab work & pertinent test results  History of Anesthesia Complications Negative for: history of anesthetic complications  Airway Mallampati: II       Dental   Pulmonary neg sleep apnea, neg COPD, former smoker,           Cardiovascular hypertension, Pt. on medications (-) Past MI and (-) CHF (-) dysrhythmias (-) Valvular Problems/Murmurs     Neuro/Psych neg Seizures (Pt placed on Keppra to prevent sz's due to the lessions on brain)    GI/Hepatic Neg liver ROS, PUD, GERD  Medicated and Controlled,  Endo/Other  diabetes, Type 2, Oral Hypoglycemic Agents  Renal/GU negative Renal ROS     Musculoskeletal   Abdominal   Peds  Hematology   Anesthesia Other Findings   Reproductive/Obstetrics                             Anesthesia Physical Anesthesia Plan  ASA: III  Anesthesia Plan: General   Post-op Pain Management:    Induction: Intravenous  PONV Risk Score and Plan: 2 and Ondansetron and Dexamethasone  Airway Management Planned: Oral ETT  Additional Equipment:   Intra-op Plan:   Post-operative Plan:   Informed Consent: I have reviewed the patients History and Physical, chart, labs and discussed the procedure including the risks, benefits and alternatives for the proposed anesthesia with the patient or authorized representative who has indicated his/her understanding and acceptance.       Plan Discussed with:   Anesthesia Plan Comments:         Anesthesia Quick Evaluation

## 2020-09-01 NOTE — Telephone Encounter (Signed)
Returned call to wife and advised to keep CT simulation appt. For today.

## 2020-09-01 NOTE — Telephone Encounter (Signed)
Patient has questions about his procedure, please call

## 2020-09-01 NOTE — Transfer of Care (Signed)
Immediate Anesthesia Transfer of Care Note  Patient: Alejandro Randolph  Procedure(s) Performed: VIDEO BRONCHOSCOPY WITH ENDOBRONCHIAL ULTRASOUND (N/A )  Patient Location: PACU  Anesthesia Type:General  Level of Consciousness: awake, alert  and oriented  Airway & Oxygen Therapy: Patient Spontanous Breathing and Patient connected to face mask oxygen  Post-op Assessment: Report given to RN and Post -op Vital signs reviewed and stable  Post vital signs: Reviewed and stable  Last Vitals:  Vitals Value Taken Time  BP 128/56   Temp    Pulse 62 09/01/20 1347  Resp 15 09/01/20 1347  SpO2 94 % 09/01/20 1347  Vitals shown include unvalidated device data.  Last Pain:  Vitals:   09/01/20 1143  TempSrc: Oral  PainSc: 0-No pain      Patients Stated Pain Goal: 0 (21/94/71 2527)  Complications: No complications documented.

## 2020-09-02 ENCOUNTER — Other Ambulatory Visit
Admission: RE | Admit: 2020-09-02 | Discharge: 2020-09-02 | Disposition: A | Discharge status: Home / Self Care | Payer: Medicare PPO | Source: Home / Self Care | Attending: Gastroenterology | Admitting: Gastroenterology

## 2020-09-02 ENCOUNTER — Encounter: Payer: Self-pay | Admitting: Pulmonary Disease

## 2020-09-02 DIAGNOSIS — C801 Malignant (primary) neoplasm, unspecified: Secondary | ICD-10-CM | POA: Diagnosis not present

## 2020-09-02 DIAGNOSIS — Z510 Encounter for antineoplastic radiation therapy: Secondary | ICD-10-CM | POA: Diagnosis not present

## 2020-09-02 DIAGNOSIS — C771 Secondary and unspecified malignant neoplasm of intrathoracic lymph nodes: Secondary | ICD-10-CM | POA: Diagnosis not present

## 2020-09-02 DIAGNOSIS — C7931 Secondary malignant neoplasm of brain: Secondary | ICD-10-CM | POA: Insufficient documentation

## 2020-09-02 LAB — SARS CORONAVIRUS 2 (TAT 6-24 HRS): SARS Coronavirus 2: NEGATIVE

## 2020-09-02 NOTE — Telephone Encounter (Signed)
Returned patient's call. Went over instructions with him. Pt verbalized understanding.

## 2020-09-03 LAB — CYTOLOGY - NON PAP

## 2020-09-04 ENCOUNTER — Encounter
Admission: RE | Disposition: A | Discharge status: Home / Self Care | Payer: Self-pay | Source: Home / Self Care | Attending: Gastroenterology

## 2020-09-04 ENCOUNTER — Encounter: Payer: Self-pay | Admitting: Gastroenterology

## 2020-09-04 ENCOUNTER — Ambulatory Visit: Payer: Medicare PPO | Admitting: Certified Registered"

## 2020-09-04 ENCOUNTER — Ambulatory Visit: Admission: RE | Admit: 2020-09-04 | Payer: Medicare PPO | Source: Ambulatory Visit

## 2020-09-04 ENCOUNTER — Ambulatory Visit
Admission: RE | Admit: 2020-09-04 | Discharge: 2020-09-04 | Disposition: A | Discharge status: Home / Self Care | Payer: Medicare PPO | Attending: Gastroenterology | Admitting: Gastroenterology

## 2020-09-04 ENCOUNTER — Other Ambulatory Visit: Payer: Self-pay

## 2020-09-04 DIAGNOSIS — Z7982 Long term (current) use of aspirin: Secondary | ICD-10-CM | POA: Diagnosis not present

## 2020-09-04 DIAGNOSIS — Z7984 Long term (current) use of oral hypoglycemic drugs: Secondary | ICD-10-CM | POA: Diagnosis not present

## 2020-09-04 DIAGNOSIS — Z510 Encounter for antineoplastic radiation therapy: Secondary | ICD-10-CM | POA: Diagnosis not present

## 2020-09-04 DIAGNOSIS — Z7952 Long term (current) use of systemic steroids: Secondary | ICD-10-CM | POA: Insufficient documentation

## 2020-09-04 DIAGNOSIS — K5730 Diverticulosis of large intestine without perforation or abscess without bleeding: Secondary | ICD-10-CM | POA: Diagnosis not present

## 2020-09-04 DIAGNOSIS — Z888 Allergy status to other drugs, medicaments and biological substances status: Secondary | ICD-10-CM | POA: Insufficient documentation

## 2020-09-04 DIAGNOSIS — Z87891 Personal history of nicotine dependence: Secondary | ICD-10-CM | POA: Diagnosis not present

## 2020-09-04 DIAGNOSIS — Z79899 Other long term (current) drug therapy: Secondary | ICD-10-CM | POA: Insufficient documentation

## 2020-09-04 DIAGNOSIS — R933 Abnormal findings on diagnostic imaging of other parts of digestive tract: Secondary | ICD-10-CM | POA: Diagnosis present

## 2020-09-04 DIAGNOSIS — R948 Abnormal results of function studies of other organs and systems: Secondary | ICD-10-CM

## 2020-09-04 HISTORY — PX: COLONOSCOPY WITH PROPOFOL: SHX5780

## 2020-09-04 LAB — GLUCOSE, CAPILLARY: Glucose-Capillary: 143 mg/dL — ABNORMAL HIGH (ref 70–99)

## 2020-09-04 SURGERY — COLONOSCOPY WITH PROPOFOL
Anesthesia: General

## 2020-09-04 MED ORDER — PROPOFOL 10 MG/ML IV BOLUS
INTRAVENOUS | Status: DC | PRN
  Administered 2020-09-04: 70 mg via INTRAVENOUS

## 2020-09-04 MED ORDER — SODIUM CHLORIDE 0.9 % IV SOLN: INTRAVENOUS | Status: DC

## 2020-09-04 MED ORDER — LIDOCAINE HCL (PF) 2 % IJ SOLN
INTRAMUSCULAR | Status: AC
  Filled 2020-09-04: qty 5

## 2020-09-04 MED ORDER — PROPOFOL 500 MG/50ML IV EMUL
INTRAVENOUS | Status: AC
  Filled 2020-09-04: qty 150

## 2020-09-04 MED ORDER — PROPOFOL 500 MG/50ML IV EMUL
INTRAVENOUS | Status: AC
  Filled 2020-09-04: qty 50

## 2020-09-04 MED ORDER — LIDOCAINE 2% (20 MG/ML) 5 ML SYRINGE
INTRAMUSCULAR | Status: DC | PRN
  Administered 2020-09-04: 25 mg via INTRAVENOUS

## 2020-09-04 MED ORDER — GLYCOPYRROLATE 0.2 MG/ML IJ SOLN
INTRAMUSCULAR | Status: AC
  Filled 2020-09-04: qty 1

## 2020-09-04 MED ORDER — PROPOFOL 500 MG/50ML IV EMUL
INTRAVENOUS | Status: DC | PRN
  Administered 2020-09-04: 120 ug/kg/min via INTRAVENOUS

## 2020-09-04 NOTE — H&P (Signed)
Jonathon Bellows, MD [address] [place], [place], [place], [address] [id number], [place], [place], [place], [address] Phone: [phone]  Fax: [phone]  Primary Care Physician:  Maryland Pink, MD   Pre-Procedure History & Physical: HPI:  Alejandro Randolph is a 75 y.o. male is here for an colonoscopy.   Past Medical History:  Diagnosis Date  . Abscess of sigmoid colon   . Cancer [facility])    brain tumor  . Diabetes mellitus without complication ([place])   . GERD (gastroesophageal reflux disease)   . Hypercholesteremia   . Hypertension   . Peripheral vascular disease ([place])   . PUD (peptic ulcer disease)     Past Surgical History:  Procedure Laterality Date  . CHOLECYSTECTOMY    . COLONOSCOPY WITH PROPOFOL N/A 07/19/2016   Procedure: COLONOSCOPY WITH PROPOFOL;  Surgeon: Jonathon Bellows, MD;  Location: [facility] ORS;  Service: Endoscopy;  Laterality: N/A;  . COLONOSCOPY WITH PROPOFOL N/A 09/02/2016   Procedure: COLONOSCOPY WITH PROPOFOL;  Surgeon: Jonathon Bellows, MD;  Location: [facility] ENDOSCOPY;  Service: Endoscopy;  Laterality: N/A;  . femoral stents Bilateral   . FLEXIBLE SIGMOIDOSCOPY  07/19/2016   Procedure: FLEXIBLE SIGMOIDOSCOPY;  Surgeon: Jonathon Bellows, MD;  Location: [facility] ORS;  Service: Endoscopy;;  . HERNIA REPAIR    . RETINAL DETACHMENT SURGERY    . TONSILLECTOMY    . VIDEO BRONCHOSCOPY WITH ENDOBRONCHIAL ULTRASOUND N/A 09/01/2020   Procedure: VIDEO BRONCHOSCOPY WITH ENDOBRONCHIAL ULTRASOUND;  Surgeon: Ottie Glazier, MD;  Location: [facility] ORS;  Service: Thoracic;  Laterality: N/A;    Prior to Admission medications   Medication Sig Start Date End Date Taking? Authorizing Provider  amLODipine (NORVASC) 10 MG tablet Take 10 mg by mouth daily. 06/23/16  Yes [provider]  aspirin EC 81 MG tablet Take 81 mg by mouth daily.   Yes [provider]  dexamethasone (DECADRON) 2 MG tablet Take 1 tablet (2 mg total) by mouth 2 (two) times daily. 08/21/20  Yes Cammie Sickle, MD  fluticasone (FLONASE) 50 MCG/ACT nasal spray Place 1 spray into both nostrils daily as needed for allergies. 07/08/20  Yes [provider]  glucose blood (ONETOUCH ULTRA) test strip USE AS DIRECTED TWICE A DAY TO CHECK BLOOD SUGAR 03/02/19  Yes [provider]  levETIRAcetam (KEPPRA) 500 MG tablet Take 1 tablet (500 mg total) by mouth 2 (two) times daily. 08/21/20  Yes Cammie Sickle, MD  lisinopril-hydrochlorothiazide (PRINZIDE,ZESTORETIC) 20-12.5 MG tablet Take 1 tablet by mouth daily. 05/25/16  Yes [provider]  metFORMIN (GLUCOPHAGE-XR) 500 MG 24 hr tablet Take 500 mg by mouth 2 (two) times daily. 06/05/16  Yes [provider]  Omega-3 Fatty Acids (OMEGA-3 FISH OIL) 500 MG CAPS Take 500 mg by mouth daily.   Yes [provider]  pantoprazole (PROTONIX) 40 MG tablet Take 40 mg by mouth daily. 05/25/16  Yes [provider]  ROCKLATAN 0.02-0.005 % SOLN Place 1 drop into the left eye daily. 08/17/19  Yes [provider]  rosuvastatin (CRESTOR) 5 MG tablet Take 5 mg by mouth at bedtime. 05/10/16  Yes [provider]  Saw Palmetto, Serenoa repens, (SAW PALMETTO PO) Take 450 mg by mouth daily.   Yes [provider]    Allergies as of 08/29/2020 - Review Complete 08/29/2020  Allergen Reaction Noted  . Atorvastatin Rash 04/02/2014    Family History  Problem Relation Age of Onset  . Diabetes Mother   . Hypertension Mother   .  Diabetes Father     Social History   Socioeconomic History  . Marital status: Married    Spouse name: Katharine Look  . Number of children: Not on file  . Years of education: Not on file  . Highest education level: Not on file  Occupational History    Comment: retired  Tobacco Use  . Smoking status: Former Smoker    Quit date: 2001    Years since quitting: 21.1  . Smokeless tobacco: Never Used  Vaping Use  . Vaping Use: Never used  Substance and Sexual Activity  .  Alcohol use: No  . Drug use: No  . Sexual activity: Not on file  Other Topics Concern  . Not on file  Social History Narrative   Lives in [place]; lives with wife; 1 son- lives close by.[id number]- Quit smoking- 2001. No Alcohol.    Feels safe in his home.   Social Determinants of Health   Financial Resource Strain: Not on file  Food Insecurity: Not on file  Transportation Needs: Not on file  Physical Activity: Not on file  Stress: Not on file  Social Connections: Not on file  Intimate Partner Violence: Not on file    Review of Systems: See HPI, otherwise negative ROS  Physical Exam: BP (!) 112/54   Pulse 70   Temp (!) 96.5 F (35.8 C) (Temporal)   Resp 20   Ht 5\' 9"  (1.753 m)   Wt 93.4 kg   SpO2 98%   BMI 30.42 kg/m  General:   Alert,  pleasant and cooperative in NAD Head:  Normocephalic and atraumatic. Neck:  Supple; no masses or thyromegaly. Lungs:  Clear throughout to auscultation, normal respiratory effort.    Heart:  +S1, +S2, Regular rate and rhythm, No edema. Abdomen:  Soft, nontender and nondistended. Normal bowel sounds, without guarding, and without rebound.   Neurologic:  Alert and  oriented x4;  grossly normal neurologically.  Impression/Plan: Alejandro Randolph is here for an colonoscopy to be performed for evaluation of abnormal Ct scan . Risks, benefits, limitations, and alternatives regarding  colonoscopy have been reviewed with the patient.  Questions have been answered.  All parties agreeable.   Jonathon Bellows, MD  09/04/2020, 7:48 AM

## 2020-09-04 NOTE — Transfer of Care (Signed)
Immediate Anesthesia Transfer of Care Note  Patient: Alejandro Randolph  Procedure(s) Performed: COLONOSCOPY WITH PROPOFOL (N/A )  Patient Location: Endoscopy Unit  Anesthesia Type:General  Level of Consciousness: sedated  Airway & Oxygen Therapy: Patient Spontanous Breathing  Post-op Assessment: Report given to RN and Post -op Vital signs reviewed and stable  Post vital signs: Reviewed  Last Vitals:  Vitals Value Taken Time  BP 93/50 09/04/20 0830  Temp    Pulse 59 09/04/20 0830  Resp 11 09/04/20 0830  SpO2 94 % 09/04/20 0830    Last Pain:  Vitals:   09/04/20 0737  TempSrc: Temporal  PainSc: 0-No pain         Complications: No complications documented.

## 2020-09-04 NOTE — Anesthesia Postprocedure Evaluation (Signed)
Anesthesia Post Note  Patient: Alejandro Randolph  Procedure(s) Performed: COLONOSCOPY WITH PROPOFOL (N/A )  Patient location during evaluation: Endoscopy Anesthesia Type: General Level of consciousness: awake and alert Pain management: pain level controlled Vital Signs Assessment: post-procedure vital signs reviewed and stable Respiratory status: spontaneous breathing, nonlabored ventilation, respiratory function stable and patient connected to nasal cannula oxygen Cardiovascular status: blood pressure returned to baseline and stable Postop Assessment: no apparent nausea or vomiting Anesthetic complications: no   No complications documented.   Last Vitals:  Vitals:   09/04/20 0840 09/04/20 0850  BP: (!) 102/56 108/68  Pulse: (!) 58 66  Resp: 14 14  Temp:    SpO2: 94% 96%    Last Pain:  Vitals:   09/04/20 0850  TempSrc:   PainSc: 0-No pain                 Martha Clan

## 2020-09-04 NOTE — Anesthesia Preprocedure Evaluation (Signed)
Anesthesia Evaluation  Patient identified by MRN, date of birth, ID band Patient awake    Reviewed: Allergy & Precautions, NPO status , Patient's Chart, lab work & pertinent test results  History of Anesthesia Complications Negative for: history of anesthetic complications  Airway Mallampati: II       Dental  (+) Dental Advidsory Given, Teeth Intact   Pulmonary neg shortness of breath, neg sleep apnea, neg COPD, neg recent URI, former smoker,           Cardiovascular hypertension, Pt. on medications (-) angina+ Peripheral Vascular Disease  (-) Past MI, (-) Cardiac Stents and (-) CHF (-) dysrhythmias (-) Valvular Problems/Murmurs     Neuro/Psych neg Seizures (Pt placed on Keppra to prevent sz's due to the lessions on brain)    GI/Hepatic Neg liver ROS, PUD, GERD  Medicated and Controlled,  Endo/Other  diabetes, Type 2, Oral Hypoglycemic Agents  Renal/GU negative Renal ROS     Musculoskeletal   Abdominal   Peds  Hematology   Anesthesia Other Findings Past Medical History: No date: Abscess of sigmoid colon No date: Cancer ([place])     Comment:  brain tumor No date: Diabetes mellitus without complication ([facility]) No date: GERD (gastroesophageal reflux disease) No date: Hypercholesteremia No date: Hypertension No date: Peripheral vascular disease ([facility]) No date: PUD (peptic ulcer disease)   Reproductive/Obstetrics                             Anesthesia Physical  Anesthesia Plan  ASA: III  Anesthesia Plan: General   Post-op Pain Management:    Induction: Intravenous  PONV Risk Score and Plan: 2 and TIVA and Propofol infusion  Airway Management Planned: Natural Airway and Nasal Cannula  Additional Equipment:   Intra-op Plan:   Post-operative Plan:   Informed Consent: I have reviewed the patients History and Physical, chart, labs and discussed the procedure including the risks,  benefits and alternatives for the proposed anesthesia with the patient or authorized representative who has indicated his/her understanding and acceptance.       Plan Discussed with:   Anesthesia Plan Comments:         Anesthesia Quick Evaluation

## 2020-09-04 NOTE — Op Note (Signed)
[facility] Gastroenterology Patient Name: Daniela Griner Procedure Date: 09/04/2020 8:04 AM MRN: [id number] Account #: [id number] Date of Birth: Sep 10, 1945 Admit Type: Outpatient Age: 75 Room: [facility] ENDO ROOM 3 Gender: Male Note Status: Finalized Procedure:             Colonoscopy Indications:           Abnormal CT of the GI tract Providers:             Jonathon Bellows MD, MD Referring MD:          Irven Easterly. Kary Kos, MD (Referring MD) Medicines:             Monitored Anesthesia Care Complications:         No immediate complications. Procedure:             Pre-Anesthesia Assessment:                        - Prior to the procedure, a History and Physical was                         performed, and patient medications, allergies and                         sensitivities were reviewed. The patient's tolerance                         of previous anesthesia was reviewed.                        - The risks and benefits of the procedure and the                         sedation options and risks were discussed with the                         patient. All questions were answered and informed                         consent was obtained.                        - ASA Grade Assessment: III - A patient with severe                         systemic disease.                        After obtaining informed consent, the colonoscope was                         passed under direct vision. Throughout the procedure,                         the patient's blood pressure, pulse, and oxygen                         saturations were monitored continuously. The                         Colonoscope was introduced through the  anus and                         advanced to the the cecum, identified by the                         appendiceal orifice. The colonoscopy was performed                         with ease. The patient tolerated the procedure well.                         The quality of the bowel  preparation was adequate. Findings:      The perianal and digital rectal examinations were normal.      Multiple small and large-mouthed diverticula were found in the sigmoid       colon.      The exam was otherwise without abnormality on direct and retroflexion       views. Impression:            - Diverticulosis in the sigmoid colon.                        - The examination was otherwise normal on direct and                         retroflexion views.                        - No specimens collected. Recommendation:        - Discharge patient to home (with escort).                        - Resume previous diet.                        - Continue present medications. Procedure Code(s):     --- Professional ---                        [id number], Colonoscopy, flexible; diagnostic, including                         collection of specimen(s) by brushing or washing, when                         performed (separate procedure) Diagnosis Code(s):     --- Professional ---                        K57.30, Diverticulosis of large intestine without                         perforation or abscess without bleeding                        R93.3, Abnormal findings on diagnostic imaging of                         other parts of digestive tract CPT copyright 2019 [employer]. All rights reserved. The codes documented in this report are preliminary and upon  coder review may  be revised to meet current compliance requirements. Jonathon Bellows, MD Jonathon Bellows MD, MD 09/04/2020 8:27:29 AM This report has been signed electronically. Number of Addenda: 0 Note Initiated On: 09/04/2020 8:04 AM Scope Withdrawal Time: 0 hours 11 minutes 18 seconds  Total Procedure Duration: 0 hours 14 minutes 54 seconds  Estimated Blood Loss:  Estimated blood loss: none.      [facility]

## 2020-09-05 ENCOUNTER — Inpatient Hospital Stay: Payer: Medicare PPO | Admitting: Internal Medicine

## 2020-09-05 ENCOUNTER — Encounter: Payer: Self-pay | Admitting: *Deleted

## 2020-09-05 ENCOUNTER — Encounter: Payer: Self-pay | Admitting: Gastroenterology

## 2020-09-05 ENCOUNTER — Inpatient Hospital Stay: Payer: Medicare PPO | Attending: Internal Medicine

## 2020-09-05 DIAGNOSIS — R911 Solitary pulmonary nodule: Secondary | ICD-10-CM

## 2020-09-05 DIAGNOSIS — Z87891 Personal history of nicotine dependence: Secondary | ICD-10-CM | POA: Diagnosis not present

## 2020-09-05 DIAGNOSIS — R5383 Other fatigue: Secondary | ICD-10-CM | POA: Insufficient documentation

## 2020-09-05 DIAGNOSIS — C7931 Secondary malignant neoplasm of brain: Secondary | ICD-10-CM | POA: Diagnosis not present

## 2020-09-05 DIAGNOSIS — C3411 Malignant neoplasm of upper lobe, right bronchus or lung: Secondary | ICD-10-CM

## 2020-09-05 DIAGNOSIS — Z8711 Personal history of peptic ulcer disease: Secondary | ICD-10-CM | POA: Diagnosis not present

## 2020-09-05 DIAGNOSIS — C771 Secondary and unspecified malignant neoplasm of intrathoracic lymph nodes: Secondary | ICD-10-CM | POA: Diagnosis present

## 2020-09-05 DIAGNOSIS — N179 Acute kidney failure, unspecified: Secondary | ICD-10-CM | POA: Insufficient documentation

## 2020-09-05 DIAGNOSIS — Z79899 Other long term (current) drug therapy: Secondary | ICD-10-CM | POA: Insufficient documentation

## 2020-09-05 DIAGNOSIS — E1151 Type 2 diabetes mellitus with diabetic peripheral angiopathy without gangrene: Secondary | ICD-10-CM | POA: Diagnosis not present

## 2020-09-05 DIAGNOSIS — I10 Essential (primary) hypertension: Secondary | ICD-10-CM | POA: Insufficient documentation

## 2020-09-05 DIAGNOSIS — E7800 Pure hypercholesterolemia, unspecified: Secondary | ICD-10-CM | POA: Diagnosis not present

## 2020-09-05 DIAGNOSIS — Z923 Personal history of irradiation: Secondary | ICD-10-CM | POA: Diagnosis not present

## 2020-09-05 DIAGNOSIS — Z7982 Long term (current) use of aspirin: Secondary | ICD-10-CM | POA: Diagnosis not present

## 2020-09-05 DIAGNOSIS — Z7984 Long term (current) use of oral hypoglycemic drugs: Secondary | ICD-10-CM | POA: Insufficient documentation

## 2020-09-05 DIAGNOSIS — K219 Gastro-esophageal reflux disease without esophagitis: Secondary | ICD-10-CM | POA: Diagnosis not present

## 2020-09-05 LAB — CBC WITH DIFFERENTIAL/PLATELET
Abs Immature Granulocytes: 0.08 10*3/uL — ABNORMAL HIGH (ref 0.00–0.07)
Basophils Absolute: 0 10*3/uL (ref 0.0–0.1)
Basophils Relative: 0 %
Eosinophils Absolute: 0.1 10*3/uL (ref 0.0–0.5)
Eosinophils Relative: 1 %
HCT: 45 % (ref 39.0–52.0)
Hemoglobin: 15.6 g/dL (ref 13.0–17.0)
Immature Granulocytes: 1 %
Lymphocytes Relative: 17 %
Lymphs Abs: 2.4 10*3/uL (ref 0.7–4.0)
MCH: 31.3 pg (ref 26.0–34.0)
MCHC: 34.7 g/dL (ref 30.0–36.0)
MCV: 90.2 fL (ref 80.0–100.0)
Monocytes Absolute: 1.6 10*3/uL — ABNORMAL HIGH (ref 0.1–1.0)
Monocytes Relative: 12 %
Neutro Abs: 9.5 10*3/uL — ABNORMAL HIGH (ref 1.7–7.7)
Neutrophils Relative %: 69 %
Platelets: 260 10*3/uL (ref 150–400)
RBC: 4.99 MIL/uL (ref 4.22–5.81)
RDW: 13.3 % (ref 11.5–15.5)
WBC: 13.8 10*3/uL — ABNORMAL HIGH (ref 4.0–10.5)
nRBC: 0 % (ref 0.0–0.2)

## 2020-09-05 LAB — BASIC METABOLIC PANEL
Anion gap: 10 (ref 5–15)
BUN: 39 mg/dL — ABNORMAL HIGH (ref 8–23)
CO2: 25 mmol/L (ref 22–32)
Calcium: 9.6 mg/dL (ref 8.9–10.3)
Chloride: 101 mmol/L (ref 98–111)
Creatinine, Ser: 1.77 mg/dL — ABNORMAL HIGH (ref 0.61–1.24)
GFR, Estimated: 40 mL/min — ABNORMAL LOW (ref >60)
Glucose, Bld: 161 mg/dL — ABNORMAL HIGH (ref 70–99)
Potassium: 4.1 mmol/L (ref 3.5–5.1)
Sodium: 136 mmol/L (ref 135–145)

## 2020-09-05 MED ORDER — DEXAMETHASONE 2 MG PO TABS: 2.0000 mg | ORAL_TABLET | Freq: Two times a day (BID) | ORAL | 0 refills | Status: DC

## 2020-09-05 NOTE — Progress Notes (Signed)
Would like to know if he should take omega 3, saw palmetto, and aspirin. Also has 4 left of dex and wants to know if he needs to continue. If so he needs a refill.

## 2020-09-05 NOTE — Progress Notes (Signed)
[place] NOTE  Patient Care Team: [place] Pink, MD as PCP - General (Family Medicine) Ottie Glazier, MD as Consulting Physician (Pulmonary Disease) Telford Nab, RN as Oncology Nurse Navigator  CHIEF COMPLAINTS/PURPOSE OF CONSULTATION: Brain lesions    Oncology History Overview Note  # JAN 17th, 2022-Headaches/Vomitting [Dr.Brasington; ophthalmology brain MRI]-multiple enhancing lesions the brain- bilateral cerebellar and cerebral hemispheres, concerning for metastatic disease. The largest lesions are seen in the left cerebellar hemisphere (22 mm), right temporal lobe (26 mm) and left occipital lobe (29 mm) with prominent surrounding vasogenic edema causing effacement of the adjacent cerebral sulci. 2. Flattening of the bilateral optic disc, right greater than left, likely related to increased intracranial pressure. Right mastoid effusion.  PET scan- JAN 25th, 2022-Hypermetabolic mediastinal lymph nodes;  No significant metabolic activity associated with small RIGHT lower lobe pulmonary nodule. This nodule remains indeterminate;  No primary malignancy identified on skull base to thigh PET-CT; Scan; Hypermetabolic brain metastasis noted; bladder nodules [on CT scan]-  DIAGNOSIS:  A. LYMPH NODE, STATION 4R (RIGHT LOWER PARATRACHEAL); EBUS FNA:  - POSITIVE FOR MALIGNANCY.  - METASTATIC ADENOCARCINOMA, TTF1 POSITIVE.   #Lung cancer-adenocarcinoma TTF-1 positive [mediastinal lymph node biopsy;Dr.Aleskerov];   #Bladder nodules [chronic; asymptomatic-no cystoscopy]; right colon uptake on PET scan- Dr.Anna colonoscopy-Feb2022- NEG.   # PVD [Dr.Schneir s/p stenting]  # NGS/MOLECULAR TESTS:penidng    # PALLIATIVE CARE EVALUATION:  # PAIN MANAGEMENT:    DIAGNOSIS: Lung cancer  STAGE:   IV      ;  GOALS:pallaitive  CURRENT/MOST RECENT THERAPY :  WBRT     Brain metastasis ([place])  08/21/2020 Initial Diagnosis   Brain metastasis ([facility])   Primary cancer  of right upper lobe of lung ([place])  09/06/2020 Initial Diagnosis   Primary cancer of right upper lobe of lung ([place])   09/06/2020 Cancer Staging   Staging form: Lung, AJCC 8th Edition - Clinical: Stage IVB (cT1, cN3, pM1c) - Signed by Cammie Sickle, MD on 09/06/2020     HISTORY OF PRESENTING ILLNESS:  Alejandro Randolph 75 y.o.  male patient former smoker-with brain lesions suggestive of metastatic disease; is here today to review the results of his mediastinal for biopsy.  Patient underwent EBUS; underwent mediastinal lymph node biopsy.  He also underwent colonoscopy for his colonic lesion.  Uneventful.  In the interim has been evaluated by radiation oncology, awaiting to start radiation next week.  Denies any worsening headache.  Denies any nausea vomiting.   Review of Systems  Constitutional: Negative for chills, diaphoresis, fever, malaise/fatigue and weight loss.  HENT: Negative for nosebleeds and sore throat.   Eyes: Negative for double vision.  Respiratory: Negative for cough, hemoptysis and wheezing.   Cardiovascular: Negative for chest pain, palpitations, orthopnea and leg swelling.  Gastrointestinal: Negative for abdominal pain, blood in stool, constipation, diarrhea, heartburn, melena, nausea and vomiting.  Genitourinary: Negative for dysuria, frequency and urgency.  Musculoskeletal: Positive for back pain. Negative for joint pain.  Skin: Negative.  Negative for itching and rash.  Neurological: Negative for dizziness, tingling, focal weakness and weakness.  Endo/Heme/Allergies: Does not bruise/bleed easily.  Psychiatric/Behavioral: Negative for depression. The patient is not nervous/anxious and does not have insomnia.      MEDICAL HISTORY:  Past Medical History:  Diagnosis Date  . Abscess of sigmoid colon   . Cancer [facility])    brain tumor  . Diabetes mellitus without complication ([place])   . GERD (gastroesophageal reflux disease)   . Hypercholesteremia   .  Hypertension    . Peripheral vascular disease ([place])   . PUD (peptic ulcer disease)     SURGICAL HISTORY: Past Surgical History:  Procedure Laterality Date  . CHOLECYSTECTOMY    . COLONOSCOPY WITH PROPOFOL N/A 07/19/2016   Procedure: COLONOSCOPY WITH PROPOFOL;  Surgeon: Jonathon Bellows, MD;  Location: [facility] ORS;  Service: Endoscopy;  Laterality: N/A;  . COLONOSCOPY WITH PROPOFOL N/A 09/02/2016   Procedure: COLONOSCOPY WITH PROPOFOL;  Surgeon: Jonathon Bellows, MD;  Location: [facility] ENDOSCOPY;  Service: Endoscopy;  Laterality: N/A;  . COLONOSCOPY WITH PROPOFOL N/A 09/04/2020   Procedure: COLONOSCOPY WITH PROPOFOL;  Surgeon: Jonathon Bellows, MD;  Location: [facility] ENDOSCOPY;  Service: Gastroenterology;  Laterality: N/A;  . femoral stents Bilateral   . FLEXIBLE SIGMOIDOSCOPY  07/19/2016   Procedure: FLEXIBLE SIGMOIDOSCOPY;  Surgeon: Jonathon Bellows, MD;  Location: [facility] ORS;  Service: Endoscopy;;  . HERNIA REPAIR    . RETINAL DETACHMENT SURGERY    . TONSILLECTOMY    . VIDEO BRONCHOSCOPY WITH ENDOBRONCHIAL ULTRASOUND N/A 09/01/2020   Procedure: VIDEO BRONCHOSCOPY WITH ENDOBRONCHIAL ULTRASOUND;  Surgeon: Ottie Glazier, MD;  Location: [facility] ORS;  Service: Thoracic;  Laterality: N/A;    SOCIAL HISTORY: Social History   Socioeconomic History  . Marital status: Married    Spouse name: Katharine Look  . Number of children: Not on file  . Years of education: Not on file  . Highest education level: Not on file  Occupational History    Comment: retired  Tobacco Use  . Smoking status: Former Smoker    Quit date: 2001    Years since quitting: 21.1  . Smokeless tobacco: Never Used  Vaping Use  . Vaping Use: Never used  Substance and Sexual Activity  . Alcohol use: No  . Drug use: No  . Sexual activity: Not on file  Other Topics Concern  . Not on file  Social History Narrative   Lives in [place]; lives with wife; 1 son- lives close by.[id number]- Quit smoking- 2001. No Alcohol.    Feels safe in his home.   Social Determinants of Health    Financial Resource Strain: Not on file  Food Insecurity: Not on file  Transportation Needs: Not on file  Physical Activity: Not on file  Stress: Not on file  Social Connections: Not on file  Intimate Partner Violence: Not on file    FAMILY HISTORY: Family History  Problem Relation Age of Onset  . Diabetes Mother   . Hypertension Mother   . Diabetes Father     ALLERGIES:  is allergic to atorvastatin.  MEDICATIONS:  Current Outpatient Medications  Medication Sig Dispense Refill  . amLODipine (NORVASC) 10 MG tablet Take 10 mg by mouth daily.    . fluticasone (FLONASE) 50 MCG/ACT nasal spray Place 1 spray into both nostrils daily as needed for allergies.    Marland Kitchen glucose blood (ONETOUCH ULTRA) test strip USE AS DIRECTED TWICE A DAY TO CHECK BLOOD SUGAR    . levETIRAcetam (KEPPRA) 500 MG tablet Take 1 tablet (500 mg total) by mouth 2 (two) times daily. 60 tablet 0  . lisinopril-hydrochlorothiazide (PRINZIDE,ZESTORETIC) 20-12.5 MG tablet Take 1 tablet by mouth daily.    . metFORMIN (GLUCOPHAGE-XR) 500 MG 24 hr tablet Take 500 mg by mouth 2 (two) times daily.    . pantoprazole (PROTONIX) 40 MG tablet Take 40 mg by mouth daily.    Marland Kitchen ROCKLATAN 0.02-0.005 % SOLN Place 1 drop into the left eye daily.    . rosuvastatin (CRESTOR) 5 MG tablet Take  5 mg by mouth at bedtime.    Marland Kitchen aspirin EC 81 MG tablet Take 81 mg by mouth daily. (Patient not taking: Reported on 09/05/2020)    . dexamethasone (DECADRON) 2 MG tablet Take 1 tablet (2 mg total) by mouth 2 (two) times daily. 30 tablet 0  . Omega-3 Fatty Acids (OMEGA-3 FISH OIL) 500 MG CAPS Take 500 mg by mouth daily. (Patient not taking: Reported on 09/05/2020)    . Saw Palmetto, Serenoa repens, (SAW PALMETTO PO) Take 450 mg by mouth daily. (Patient not taking: Reported on 09/05/2020)     No current facility-administered medications for this visit.      Marland Kitchen  PHYSICAL EXAMINATION: ECOG PERFORMANCE STATUS: 1 - Symptomatic but completely  ambulatory  Vitals:   09/05/20 1001  BP: 119/70  Pulse: 66  Temp: 97.8 F (36.6 C)  SpO2: 98%   Filed Weights   09/05/20 1001  Weight: 195 lb 6 oz (88.6 kg)    Physical Exam Constitutional:      Comments: Patient is walking independently.  Accompanied by his wife.  HENT:     Head: Normocephalic and atraumatic.     Mouth/Throat:     Pharynx: No oropharyngeal exudate.  Eyes:     Pupils: Pupils are equal, round, and reactive to light.  Cardiovascular:     Rate and Rhythm: Normal rate and regular rhythm.  Pulmonary:     Effort: No respiratory distress.     Breath sounds: No wheezing.     Comments: Decreased breath sounds bilaterally at the bases.  No wheeze or crackles. Abdominal:     General: Bowel sounds are normal. There is no distension.     Palpations: Abdomen is soft. There is no mass.     Tenderness: There is no abdominal tenderness. There is no guarding or rebound.  Musculoskeletal:        General: No tenderness. Normal range of motion.     Cervical back: Normal range of motion and neck supple.  Skin:    General: Skin is warm.  Neurological:     Mental Status: He is alert and oriented to person, place, and time.  Psychiatric:        Mood and Affect: Affect normal.      LABORATORY DATA:  I have reviewed the data as listed Lab Results  Component Value Date   WBC 13.8 (H) 09/05/2020   HGB 15.6 09/05/2020   HCT 45.0 09/05/2020   MCV 90.2 09/05/2020   PLT 260 09/05/2020   Recent Labs    08/21/20 1120 09/05/20 0927  NA 137 136  K 3.8 4.1  CL 103 101  CO2 25 25  GLUCOSE 171* 161*  BUN 18 39*  CREATININE 1.41* 1.77*  CALCIUM 10.0 9.6  GFRNONAA 53* 40*  PROT 7.2  --   ALBUMIN 3.8  --   AST 29  --   ALT 30  --   ALKPHOS 45  --   BILITOT 0.7  --     RADIOGRAPHIC STUDIES: I have personally reviewed the radiological images as listed and agreed with the findings in the report. MR BRAIN W WO CONTRAST  Result Date: 08/18/2020 CLINICAL DATA:  Optic  nerve edema. EXAM: MRI HEAD AND ORBITS WITHOUT AND WITH CONTRAST TECHNIQUE: Multiplanar, multiecho pulse sequences of the brain and surrounding structures were obtained without and with intravenous contrast. Multiplanar, multiecho pulse sequences of the orbits and surrounding structures were obtained including fat saturation techniques, before and after intravenous contrast  administration. CONTRAST:  12mL GADAVIST GADOBUTROL 1 MMOL/ML IV SOLN COMPARISON:  None. FINDINGS: MRI HEAD FINDINGS Brain: Multiple enhancing lesions are seen scattered throughout the bilateral cerebellar and cerebral hemispheres, concerning for metastatic disease. Lesions show a peripheral rim enhancement, some of them with a target appearance. At least 16 lesions are identified. The largest lesions are seen in the left cerebellar hemisphere (22 mm), right temporal lobe (26 mm) and left occipital lobe (29 mm) with prominent surrounding vasogenic edema causing effacement of the adjacent cerebral sulci. No hydrocephalus, hemorrhage, acute infarct or midline shift. Vascular: Normal flow voids. Skull and upper cervical spine: Normal marrow signal. Other: Right mastoid effusion. MRI ORBITS FINDINGS Orbits: Bilateral lens surgery. Left scleral buckle surgery. Flattening of the bilateral optic disc, right greater than, likely related to increased intracranial pressure. Optic nerves are otherwise maintained. Orbital fat, extra ocular muscles and vascular structures appear preserved. Optic chiasm, cavernous sinus and suprasellar cistern are unremarkable. Visualized sinuses: Trace mucosal thickening scattered throughout the paranasal sinuses Soft tissues: Negative. IMPRESSION: 1. Multiple enhancing lesions scattered throughout the bilateral cerebellar and cerebral hemispheres, concerning for metastatic disease. The largest lesions are seen in the left cerebellar hemisphere (22 mm), right temporal lobe (26 mm) and left occipital lobe (29 mm) with prominent  surrounding vasogenic edema causing effacement of the adjacent cerebral sulci. 2. Flattening of the bilateral optic disc, right greater than left, likely related to increased intracranial pressure. 3. Right mastoid effusion. These results will be called to the ordering clinician or representative by the Radiologist Assistant, and communication documented in the PACS or [employer]. Electronically Signed   By: Pedro Earls M.D.   On: 08/18/2020 21:07   CT CHEST ABDOMEN PELVIS W CONTRAST  Addendum Date: 08/22/2020   ADDENDUM REPORT: 08/22/2020 10:18 ADDENDUM: A comparison CT from 07/10/2016 was made available. The RIGHT adrenal gland lesion is stable. 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 Additionally, there is nodularity within the bladder on comparison CT from 2017. This lowers the suspicion bladder neoplasm but does not exclude neoplasm. Additionally, the retroperitoneal nodes described on current CT or are also stable compared to 07/10/2016. The patient may benefit from outpatient FDG PET scan to identify source of presumed brain metastasis. Electronically Signed   By: Suzy Bouchard M.D.   On: 08/22/2020 10:18   Result Date: 08/22/2020 CLINICAL DATA:  Brain lesions.  Concern for brain metastasis. EXAM: CT CHEST, ABDOMEN, AND PELVIS WITH CONTRAST TECHNIQUE: Multidetector CT imaging of the chest, abdomen and pelvis was performed following the standard protocol during bolus administration of intravenous contrast. CONTRAST:  144mL OMNIPAQUE IOHEXOL 300 MG/ML  SOLN COMPARISON:  None. FINDINGS: CT CHEST FINDINGS Cardiovascular: No significant vascular findings. Normal heart size. No pericardial effusion. Mediastinum/Nodes: Enlarged RIGHT paratracheal node measures 15 mm. Enlarged subcarinal node measures 11 mm. Lungs/Pleura: 7 mm nodule in the RIGHT lower lobe (image 56/4). Musculoskeletal: No aggressive osseous lesion. CT  ABDOMEN AND PELVIS FINDINGS Hepatobiliary: No focal hepatic lesion. Postcholecystectomy. No biliary dilatation. Pancreas: Pancreas is normal. No ductal dilatation. No pancreatic inflammation. Spleen: Normal spleen Adrenals/urinary tract: Rounded nodule of the RIGHT adrenal gland measures 2.4 cm. LEFT adrenal gland normal. No enhancing renal cortical lesion. Bilateral simple fluid attenuation renal cysts. Extrarenal pelvis on the LEFT. Enhancing focus of flattened enhancing tissue posterior LEFT aspect bladder measures 13 mm by 4 mm (image 29/2) Stomach/Bowel: Large hiatal hernia. Small bowel normal. Cecum normal. Appendix not identified. The  colon and rectosigmoid colon are normal. Vascular/Lymphatic: Abdominal aorta is normal in caliber with intimal calcification. Rounded node in the retroperitoneum adjacent to the LEFT ureter measures 6 mm (image 83/2. Small the RIGHT common iliac node measures 6 mm on image 89/2. Small LEFT external iliac lymph node measures 7 mm image 22. Reproductive: Prostate enlarged to 16 mm Other: There is peritoneal metastasis. Musculoskeletal: No aggressive osseous lesion. IMPRESSION: Chest Impression: 1. Single pulmonary nodule in the RIGHT lower lobe is concerning for solitary pulmonary metastasis. 2. Metastatic mediastinal lymphadenopathy. Abdomen / Pelvis Impression: 1. Flattened enhancing lesion in the posterior LEFT bladder is concerning for bladder neoplasm. 2. Several subcentimeter rounded lymph nodes in the LEFT retroperitoneum and along the iliac vessels are not pathologic by size criteria but concerning for nodal metastasis. 3. Enlarged RIGHT adrenal gland concerning for RIGHT adrenal metastasis. Electronically Signed: By: Suzy Bouchard M.D. On: 08/21/2020 18:20   NM PET Image Initial (PI) Skull Base To Thigh  Result Date: 08/26/2020 CLINICAL DATA:  Initial treatment strategy for malignancy of unknown primary. Brain metastasis. EXAM: NUCLEAR MEDICINE PET SKULL BASE TO  THIGH TECHNIQUE: 11.0 mCi F-18 FDG was injected intravenously. Full-ring PET imaging was performed from the skull base to thigh after the radiotracer. CT data was obtained and used for attenuation correction and anatomic localization. Fasting blood glucose: 134 mg/dl COMPARISON:  CT 08/21/2020 FINDINGS: Mediastinal blood pool activity: SUV max Liver activity: SUV max NA NECK: Hypermetabolic lesion in the LEFT cerebellum corresponds of brain metastasis on comparison MRI. Additional lesion in the RIGHT temporal lobe and LEFT occipital lobe Incidental CT findings: none CHEST: Hypermetabolic RIGHT lower paratracheal lymph node measures 13 mm short axis with SUV max equal 5.4. Hypermetabolic subcarinal lymph node SUV max equal 7.1 Small subpleural nodule in the RIGHT lower lobe measures 6 mm does not have significant metabolic activity. No hypermetabolic pulmonary nodules evident. Incidental CT findings: none ABDOMEN/PELVIS: No abnormal metabolic activity in liver. No abnormal metabolic activity associated with the RIGHT adrenal gland which is low attenuation consistent with benign adenoma. No hypermetabolic abdominal or pelvic lymph nodes. Specifically no metabolic activity associated with the small periaortic retroperitoneal lymph nodes. No abnormal activity in the pancreas. There is a diffuse metabolic activity associated with the small bowel and colon without focality. No evidence of bowel obstruction. Incidental CT findings: Bilateral small  hydroceles. SKELETON: No evidence skeletal metastasis. Incidental CT findings: none IMPRESSION: 1. Hypermetabolic mediastinal lymph nodes consistent with metastatic mediastinal adenopathy. 2. No significant metabolic activity associated with small RIGHT lower lobe pulmonary nodule. This nodule remains indeterminate. 3. No primary malignancy identified on skull base to thigh PET-CT scan. 4. Hypermetabolic brain metastasis noted. Electronically Signed   By: Suzy Bouchard M.D.    On: 08/26/2020 09:51   MR ORBITS W WO CONTRAST  Result Date: 08/18/2020 CLINICAL DATA:  Optic nerve edema. EXAM: MRI HEAD AND ORBITS WITHOUT AND WITH CONTRAST TECHNIQUE: Multiplanar, multiecho pulse sequences of the brain and surrounding structures were obtained without and with intravenous contrast. Multiplanar, multiecho pulse sequences of the orbits and surrounding structures were obtained including fat saturation techniques, before and after intravenous contrast administration. CONTRAST:  75mL GADAVIST GADOBUTROL 1 MMOL/ML IV SOLN COMPARISON:  None. FINDINGS: MRI HEAD FINDINGS Brain: Multiple enhancing lesions are seen scattered throughout the bilateral cerebellar and cerebral hemispheres, concerning for metastatic disease. Lesions show a peripheral rim enhancement, some of them with a target appearance. At least 16 lesions are identified. The largest lesions are seen in the  left cerebellar hemisphere (22 mm), right temporal lobe (26 mm) and left occipital lobe (29 mm) with prominent surrounding vasogenic edema causing effacement of the adjacent cerebral sulci. No hydrocephalus, hemorrhage, acute infarct or midline shift. Vascular: Normal flow voids. Skull and upper cervical spine: Normal marrow signal. Other: Right mastoid effusion. MRI ORBITS FINDINGS Orbits: Bilateral lens surgery. Left scleral buckle surgery. Flattening of the bilateral optic disc, right greater than, likely related to increased intracranial pressure. Optic nerves are otherwise maintained. Orbital fat, extra ocular muscles and vascular structures appear preserved. Optic chiasm, cavernous sinus and suprasellar cistern are unremarkable. Visualized sinuses: Trace mucosal thickening scattered throughout the paranasal sinuses Soft tissues: Negative. IMPRESSION: 1. Multiple enhancing lesions scattered throughout the bilateral cerebellar and cerebral hemispheres, concerning for metastatic disease. The largest lesions are seen in the left  cerebellar hemisphere (22 mm), right temporal lobe (26 mm) and left occipital lobe (29 mm) with prominent surrounding vasogenic edema causing effacement of the adjacent cerebral sulci. 2. Flattening of the bilateral optic disc, right greater than left, likely related to increased intracranial pressure. 3. Right mastoid effusion. These results will be called to the ordering clinician or representative by the Radiologist Assistant, and communication documented in the PACS or [employer]. Electronically Signed   By: Pedro Earls M.D.   On: 08/18/2020 21:07    ASSESSMENT & PLAN:   Brain metastasis ([place])       Primary cancer of right upper lobe of lung ([place]) #Mediastinal for biopsy-possible adenocarcinoma TTF-1 positive-suggestive of lung origin.  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  Unfortunately treatments are palliative not curative.  See discussion Prognosis  #Recommend proceeding with molecular testing/Omniseq-we will help decide treatment options/also prognosis-see below.  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  Refilled.  Patient starting radiation on 2/07; continue Keppra.  #Diabetes/recommend continued close monitoring of blood sugars while on steroids.  #Bladder nodules-asymptomatic.  Monitor for now.  #Right colon mass-status post colonoscopy negative for any malignancy.  #Acute renal failure-Baseline creatinine 0.9; creat 1.7-question contrast induced.  Recommend increased p.o. fluid intake.  Also recommend holding lisinopril hydrochlorothiazide.   #Prognosis: I had a long discussion with the patient and wife regarding incurable nature of the disease.  Discussed that prognosis of lung cancer overall poor-median survival-1 to 3 years [better with novel mutations].  Offered to speak to family-they declined.   # DISPOSITION: # Follow up in 3 week;MD;  labs cbc/cmp-dr.B  All questions were answered. The patient  knows to call the clinic with any problems, questions or concerns.    Cammie Sickle, MD 09/06/2020 8:42 AM

## 2020-09-05 NOTE — Patient Instructions (Signed)
#   STOP lisinopril-HCTZ [because of rising kidney numbers]; drink fluids.   # check BP at home;a nd call us if BP is consistently above 150/90.

## 2020-09-05 NOTE — Progress Notes (Signed)
  Oncology Nurse Navigator Documentation  Navigator Location: [facility] Onc (09/05/20 1000)   )Navigator Encounter Type: Follow-up Appt (09/05/20 1000)   Abnormal Finding Date: 08/18/20 (09/05/20 1000) Confirmed Diagnosis Date: 09/03/20 (09/05/20 1000)               Patient Visit Type: MedOnc (09/05/20 1000) Treatment Phase: Pre-Tx/Tx Discussion (09/05/20 1000) Barriers/Navigation Needs: Coordination of Care;Education (09/05/20 1000) Education: Newly Diagnosed Cancer Education;Understanding Cancer/ Treatment Options (09/05/20 1000) Interventions: Coordination of Care;Education (09/05/20 1000)   Coordination of Care: Appts;Chemo;Pathology (09/05/20 1000) Education Method: Written (09/05/20 1000)      Acuity: Level 2-Minimal Needs (1-2 Barriers Identified) (09/05/20 1000)    met with patient and his wife during follow up visit with Dr. Rogue Bussing to discuss recent biopsy results. All questions answered during visit. Pt given resources regarding diagnosis and supportive services available. Reviewed upcoming appts. Informed that may reschedule his follow up visit sooner if molecular studies report sooner than anticipated. Contact info given and instructed to call with any further questions or needs. Pt verbalized understanding. Nothing further needed at this time.     Time Spent with Patient: 45 (09/05/20 1000)

## 2020-09-06 DIAGNOSIS — C3411 Malignant neoplasm of upper lobe, right bronchus or lung: Secondary | ICD-10-CM | POA: Insufficient documentation

## 2020-09-06 NOTE — Assessment & Plan Note (Addendum)
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  Unfortunately treatments are palliative not curative.  See discussion Prognosis  #Recommend proceeding with molecular testing/Omniseq-we will help decide treatment options/also prognosis-see below.  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  Refilled.  Patient starting radiation on 2/07; continue Keppra.  #Diabetes/recommend continued close monitoring of blood sugars while on steroids.  #Bladder nodules-asymptomatic.  Monitor for now.  #Right colon mass-status post colonoscopy negative for any malignancy.  #Acute renal failure-Baseline creatinine 0.9; creat 1.7-question contrast induced.  Recommend increased p.o. fluid intake.  Also recommend holding lisinopril hydrochlorothiazide.   #Prognosis: I had a long discussion with the patient and wife regarding incurable nature of the disease.  Discussed that prognosis of lung cancer overall poor-median survival-1 to 3 years [better with novel mutations].  Offered to speak to family-they declined.   # DISPOSITION: # Follow up in 3 week;MD;  labs cbc/cmp-dr.B

## 2020-09-08 ENCOUNTER — Ambulatory Visit
Admission: RE | Admit: 2020-09-08 | Discharge: 2020-09-08 | Disposition: A | Discharge status: Home / Self Care | Payer: Medicare PPO | Source: Ambulatory Visit | Attending: Radiation Oncology | Admitting: Radiation Oncology

## 2020-09-08 DIAGNOSIS — Z510 Encounter for antineoplastic radiation therapy: Secondary | ICD-10-CM | POA: Diagnosis not present

## 2020-09-09 ENCOUNTER — Ambulatory Visit
Admission: RE | Admit: 2020-09-09 | Discharge: 2020-09-09 | Disposition: A | Discharge status: Home / Self Care | Payer: Medicare PPO | Source: Ambulatory Visit | Attending: Radiation Oncology | Admitting: Radiation Oncology

## 2020-09-09 DIAGNOSIS — Z510 Encounter for antineoplastic radiation therapy: Secondary | ICD-10-CM | POA: Diagnosis not present

## 2020-09-10 ENCOUNTER — Ambulatory Visit
Admission: RE | Admit: 2020-09-10 | Discharge: 2020-09-10 | Disposition: A | Discharge status: Home / Self Care | Payer: Medicare PPO | Source: Ambulatory Visit | Attending: Radiation Oncology | Admitting: Radiation Oncology

## 2020-09-10 DIAGNOSIS — Z510 Encounter for antineoplastic radiation therapy: Secondary | ICD-10-CM | POA: Diagnosis not present

## 2020-09-11 ENCOUNTER — Ambulatory Visit
Admission: RE | Admit: 2020-09-11 | Discharge: 2020-09-11 | Disposition: A | Discharge status: Home / Self Care | Payer: Medicare PPO | Source: Ambulatory Visit | Attending: Radiation Oncology | Admitting: Radiation Oncology

## 2020-09-11 DIAGNOSIS — Z510 Encounter for antineoplastic radiation therapy: Secondary | ICD-10-CM | POA: Diagnosis not present

## 2020-09-12 ENCOUNTER — Telehealth: Payer: Self-pay | Admitting: *Deleted

## 2020-09-12 ENCOUNTER — Ambulatory Visit
Admission: RE | Admit: 2020-09-12 | Discharge: 2020-09-12 | Disposition: A | Discharge status: Home / Self Care | Payer: Medicare PPO | Source: Ambulatory Visit | Attending: Radiation Oncology | Admitting: Radiation Oncology

## 2020-09-12 DIAGNOSIS — Z510 Encounter for antineoplastic radiation therapy: Secondary | ICD-10-CM | POA: Diagnosis not present

## 2020-09-12 MED ORDER — LEVETIRACETAM 500 MG PO TABS: 500.0000 mg | ORAL_TABLET | Freq: Two times a day (BID) | ORAL | 6 refills | Status: DC

## 2020-09-12 NOTE — Telephone Encounter (Signed)
Patient requested RF for Keppra. Dr. Rogue Bussing approved and new script sent to patient's pharmacy.

## 2020-09-15 ENCOUNTER — Ambulatory Visit
Admission: RE | Admit: 2020-09-15 | Discharge: 2020-09-15 | Disposition: A | Discharge status: Home / Self Care | Payer: Medicare PPO | Source: Ambulatory Visit | Attending: Radiation Oncology | Admitting: Radiation Oncology

## 2020-09-15 DIAGNOSIS — Z510 Encounter for antineoplastic radiation therapy: Secondary | ICD-10-CM | POA: Diagnosis not present

## 2020-09-16 ENCOUNTER — Ambulatory Visit
Admission: RE | Admit: 2020-09-16 | Discharge: 2020-09-16 | Disposition: A | Discharge status: Home / Self Care | Payer: Medicare PPO | Source: Ambulatory Visit | Attending: Radiation Oncology | Admitting: Radiation Oncology

## 2020-09-16 DIAGNOSIS — Z510 Encounter for antineoplastic radiation therapy: Secondary | ICD-10-CM | POA: Diagnosis not present

## 2020-09-17 ENCOUNTER — Ambulatory Visit
Admission: RE | Admit: 2020-09-17 | Discharge: 2020-09-17 | Disposition: A | Discharge status: Home / Self Care | Payer: Medicare PPO | Source: Ambulatory Visit | Attending: Radiation Oncology | Admitting: Radiation Oncology

## 2020-09-17 DIAGNOSIS — Z510 Encounter for antineoplastic radiation therapy: Secondary | ICD-10-CM | POA: Diagnosis not present

## 2020-09-18 ENCOUNTER — Ambulatory Visit
Admission: RE | Admit: 2020-09-18 | Discharge: 2020-09-18 | Disposition: A | Discharge status: Home / Self Care | Payer: Medicare PPO | Source: Ambulatory Visit | Attending: Radiation Oncology | Admitting: Radiation Oncology

## 2020-09-18 DIAGNOSIS — Z510 Encounter for antineoplastic radiation therapy: Secondary | ICD-10-CM | POA: Diagnosis not present

## 2020-09-19 ENCOUNTER — Ambulatory Visit
Admission: RE | Admit: 2020-09-19 | Discharge: 2020-09-19 | Disposition: A | Discharge status: Home / Self Care | Payer: Medicare PPO | Source: Ambulatory Visit | Attending: Radiation Oncology | Admitting: Radiation Oncology

## 2020-09-19 ENCOUNTER — Other Ambulatory Visit: Payer: Self-pay | Admitting: *Deleted

## 2020-09-19 DIAGNOSIS — Z510 Encounter for antineoplastic radiation therapy: Secondary | ICD-10-CM | POA: Diagnosis not present

## 2020-09-26 ENCOUNTER — Encounter: Payer: Self-pay | Admitting: Internal Medicine

## 2020-09-26 ENCOUNTER — Inpatient Hospital Stay: Payer: Medicare PPO | Admitting: Internal Medicine

## 2020-09-26 ENCOUNTER — Other Ambulatory Visit: Payer: Self-pay

## 2020-09-26 ENCOUNTER — Inpatient Hospital Stay: Payer: Medicare PPO

## 2020-09-26 DIAGNOSIS — C3411 Malignant neoplasm of upper lobe, right bronchus or lung: Secondary | ICD-10-CM | POA: Diagnosis not present

## 2020-09-26 DIAGNOSIS — C7931 Secondary malignant neoplasm of brain: Secondary | ICD-10-CM

## 2020-09-26 LAB — CBC WITH DIFFERENTIAL/PLATELET
Abs Immature Granulocytes: 0.13 10*3/uL — ABNORMAL HIGH (ref 0.00–0.07)
Basophils Absolute: 0 10*3/uL (ref 0.0–0.1)
Basophils Relative: 0 %
Eosinophils Absolute: 0.2 10*3/uL (ref 0.0–0.5)
Eosinophils Relative: 2 %
HCT: 43.2 % (ref 39.0–52.0)
Hemoglobin: 14.3 g/dL (ref 13.0–17.0)
Immature Granulocytes: 1 %
Lymphocytes Relative: 11 %
Lymphs Abs: 1 10*3/uL (ref 0.7–4.0)
MCH: 31.2 pg (ref 26.0–34.0)
MCHC: 33.1 g/dL (ref 30.0–36.0)
MCV: 94.1 fL (ref 80.0–100.0)
Monocytes Absolute: 0.9 10*3/uL (ref 0.1–1.0)
Monocytes Relative: 9 %
Neutro Abs: 7 10*3/uL (ref 1.7–7.7)
Neutrophils Relative %: 77 %
Platelets: 211 10*3/uL (ref 150–400)
RBC: 4.59 MIL/uL (ref 4.22–5.81)
RDW: 14 % (ref 11.5–15.5)
WBC: 9.2 10*3/uL (ref 4.0–10.5)
nRBC: 0 % (ref 0.0–0.2)

## 2020-09-26 LAB — COMPREHENSIVE METABOLIC PANEL
ALT: 58 U/L — ABNORMAL HIGH (ref 0–44)
AST: 32 U/L (ref 15–41)
Albumin: 3 g/dL — ABNORMAL LOW (ref 3.5–5.0)
Alkaline Phosphatase: 56 U/L (ref 38–126)
Anion gap: 9 (ref 5–15)
BUN: 19 mg/dL (ref 8–23)
CO2: 28 mmol/L (ref 22–32)
Calcium: 9.8 mg/dL (ref 8.9–10.3)
Chloride: 103 mmol/L (ref 98–111)
Creatinine, Ser: 1.14 mg/dL (ref 0.61–1.24)
GFR, Estimated: >60 mL/min (ref >60)
Glucose, Bld: 153 mg/dL — ABNORMAL HIGH (ref 70–99)
Potassium: 4.3 mmol/L (ref 3.5–5.1)
Sodium: 140 mmol/L (ref 135–145)
Total Bilirubin: 0.9 mg/dL (ref 0.3–1.2)
Total Protein: 6.7 g/dL (ref 6.5–8.1)

## 2020-09-26 NOTE — Assessment & Plan Note (Addendum)
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  NGS is pending at this time.  #Pending NGS I discussed the role of systemic immunotherapy for palliative reasons.  Patient understands treatments are not curative.  Recommend carbo Alimta-Keytruda every 3 weeks x 4 followed by Keytruda maintenance.   Discussed the potential side effects including but not limited to-increasing fatigue, nausea vomiting, diarrhea, hair loss, sores in the mouth, increase risk of infection and also neuropathy.   I discussed the mechanism of action; The goal of therapy is palliative; and length of treatments are likely ongoing/based upon the results of the scans. Discussed the potential side effects of immunotherapy including but not limited to diarrhea; skin rash; elevated LFTs/endocrine abnormalities etc.  #Brain metastases- on dexamethasone taper x 2 weeks ; s/p WBRT [finished FEB 18th, 2022];  Keppra.  #Diabetes/recommend continued close monitoring of blood sugars while on steroids.  #Bladder nodules-asymptomatic.  Monitor for now.  #Acute renal failure-Baseline creatinine 0.9; creat 1.7-question contrast induced. Improved- stay off  lisinopril hydrochlorothiazide.   # DISPOSITION: # Follow up TBD- Dr.B  Addendum:on 2/25-discussed with the patient the results of the molecular testing negative for PDL 1/or any targetable mutation.  Recommend carbo Alimta-Keytruda.  Recommend chemotherapy education; port placement.  We will plan to start treatment in about 2 weeks.  Patient agreement.  # 40 minutes face-to-face with the patient discussing the above plan of care; more than 50% of time spent on prognosis/ natural history; counseling and coordination.

## 2020-09-26 NOTE — Progress Notes (Signed)
Wife requesting cancer policy to be completed. She will pick up forms next week. Hatcher Froning, RN will call wife when forms are ready to be picked up.

## 2020-09-26 NOTE — Progress Notes (Signed)
[facility] NOTE  Patient Care Team: [place] Pink, MD as PCP - General (Family Medicine) Ottie Glazier, MD as Consulting Physician (Pulmonary Disease) Telford Nab, RN as Oncology Nurse Navigator  CHIEF COMPLAINTS/PURPOSE OF CONSULTATION: Brain lesions    Oncology History Overview Note  # JAN 17th, 2022-Headaches/Vomitting [Dr.Brasington; ophthalmology brain MRI]-multiple enhancing lesions the brain- bilateral cerebellar and cerebral hemispheres, concerning for metastatic disease. The largest lesions are seen in the left cerebellar hemisphere (22 mm), right temporal lobe (26 mm) and left occipital lobe (29 mm) with prominent surrounding vasogenic edema causing effacement of the adjacent cerebral sulci. 2. Flattening of the bilateral optic disc, right greater than left, likely related to increased intracranial pressure. Right mastoid effusion.  PET scan- JAN 25th, 2022-Hypermetabolic mediastinal lymph nodes;  No significant metabolic activity associated with small RIGHT lower lobe pulmonary nodule. This nodule remains indeterminate;  No primary malignancy identified on skull base to thigh PET-CT; Scan; Hypermetabolic brain metastasis noted; bladder nodules [on CT scan]-  DIAGNOSIS:  A. LYMPH NODE, STATION 4R (RIGHT LOWER PARATRACHEAL); EBUS FNA:  - POSITIVE FOR MALIGNANCY.  - METASTATIC ADENOCARCINOMA, TTF1 POSITIVE.   #Lung cancer-adenocarcinoma TTF-1 positive [mediastinal lymph node biopsy;Dr.Aleskerov];   #Bladder nodules [chronic; asymptomatic-no cystoscopy]; right colon uptake on PET scan- Dr.Anna colonoscopy-Feb2022- NEG.   # PVD [Dr.Schneir s/p stenting]  # NGS/MOLECULAR TESTS:penidng    # PALLIATIVE CARE EVALUATION:  # PAIN MANAGEMENT:    DIAGNOSIS: Lung cancer  STAGE:   IV      ;  GOALS:pallaitive  CURRENT/MOST RECENT THERAPY :  WBRT     Brain metastasis ([place])  08/21/2020 Initial Diagnosis   Brain metastasis ([facility])   Primary cancer  of right upper lobe of lung ([place])  09/06/2020 Initial Diagnosis   Primary cancer of right upper lobe of lung ([place])   09/06/2020 Cancer Staging   Staging form: Lung, AJCC 8th Edition - Clinical: Stage IVB (cT1, cN3, pM1c) - Signed by Cammie Sickle, MD on 09/06/2020   09/29/2020 -  Chemotherapy    Patient is on Treatment Plan: LUNG CARBOPLATIN / PEMETREXED / PEMBROLIZUMAB Q21D INDUCTION X 4 CYCLES / MAINTENANCE PEMETREXED + PEMBROLIZUMAB        HISTORY OF PRESENTING ILLNESS:  Alejandro Randolph 75 y.o.  male metastatic lung cancer to brain-is here for follow-up with regards to plan for systemic therapy.  Patient finished radiation; is currently on a tapering dose of steroids.  No seizures.  No headaches.  Mild to moderate fatigue.  Otherwise no chest pain no cough.  Review of Systems  Constitutional: Negative for chills, diaphoresis, fever, malaise/fatigue and weight loss.  HENT: Negative for nosebleeds and sore throat.   Eyes: Negative for double vision.  Respiratory: Negative for cough, hemoptysis and wheezing.   Cardiovascular: Negative for chest pain, palpitations, orthopnea and leg swelling.  Gastrointestinal: Negative for abdominal pain, blood in stool, constipation, diarrhea, heartburn, melena, nausea and vomiting.  Genitourinary: Negative for dysuria, frequency and urgency.  Musculoskeletal: Positive for back pain. Negative for joint pain.  Skin: Negative.  Negative for itching and rash.  Neurological: Negative for dizziness, tingling, focal weakness and weakness.  Endo/Heme/Allergies: Does not bruise/bleed easily.  Psychiatric/Behavioral: Negative for depression. The patient is not nervous/anxious and does not have insomnia.      MEDICAL HISTORY:  Past Medical History:  Diagnosis Date  . Abscess of sigmoid colon   . Cancer [facility])    brain tumor  . Diabetes mellitus without complication ([place])   .  GERD (gastroesophageal reflux disease)   . Hypercholesteremia   .  Hypertension   . Peripheral vascular disease ([place])   . PUD (peptic ulcer disease)     SURGICAL HISTORY: Past Surgical History:  Procedure Laterality Date  . CHOLECYSTECTOMY    . COLONOSCOPY WITH PROPOFOL N/A 07/19/2016   Procedure: COLONOSCOPY WITH PROPOFOL;  Surgeon: Jonathon Bellows, MD;  Location: [facility] ORS;  Service: Endoscopy;  Laterality: N/A;  . COLONOSCOPY WITH PROPOFOL N/A 09/02/2016   Procedure: COLONOSCOPY WITH PROPOFOL;  Surgeon: Jonathon Bellows, MD;  Location: [facility] ENDOSCOPY;  Service: Endoscopy;  Laterality: N/A;  . COLONOSCOPY WITH PROPOFOL N/A 09/04/2020   Procedure: COLONOSCOPY WITH PROPOFOL;  Surgeon: Jonathon Bellows, MD;  Location: [facility] ENDOSCOPY;  Service: Gastroenterology;  Laterality: N/A;  . femoral stents Bilateral   . FLEXIBLE SIGMOIDOSCOPY  07/19/2016   Procedure: FLEXIBLE SIGMOIDOSCOPY;  Surgeon: Jonathon Bellows, MD;  Location: [facility] ORS;  Service: Endoscopy;;  . HERNIA REPAIR    . RETINAL DETACHMENT SURGERY    . TONSILLECTOMY    . VIDEO BRONCHOSCOPY WITH ENDOBRONCHIAL ULTRASOUND N/A 09/01/2020   Procedure: VIDEO BRONCHOSCOPY WITH ENDOBRONCHIAL ULTRASOUND;  Surgeon: Ottie Glazier, MD;  Location: [facility] ORS;  Service: Thoracic;  Laterality: N/A;    SOCIAL HISTORY: Social History   Socioeconomic History  . Marital status: Married    Spouse name: Katharine Look  . Number of children: Not on file  . Years of education: Not on file  . Highest education level: Not on file  Occupational History    Comment: retired  Tobacco Use  . Smoking status: Former Smoker    Quit date: 2001    Years since quitting: 21.1  . Smokeless tobacco: Never Used  Vaping Use  . Vaping Use: Never used  Substance and Sexual Activity  . Alcohol use: No  . Drug use: No  . Sexual activity: Not on file  Other Topics Concern  . Not on file  Social History Narrative   Lives in [place]; lives with wife; 1 son- lives close by.[id number]- Quit smoking- 2001. No Alcohol.    Feels safe in his home.   Social Determinants  of Health   Financial Resource Strain: Not on file  Food Insecurity: Not on file  Transportation Needs: Not on file  Physical Activity: Not on file  Stress: Not on file  Social Connections: Not on file  Intimate Partner Violence: Not on file    FAMILY HISTORY: Family History  Problem Relation Age of Onset  . Diabetes Mother   . Hypertension Mother   . Diabetes Father     ALLERGIES:  is allergic to atorvastatin.  MEDICATIONS:  Current Outpatient Medications  Medication Sig Dispense Refill  . amLODipine (NORVASC) 10 MG tablet Take 10 mg by mouth daily.    Marland Kitchen aspirin EC 81 MG tablet Take 81 mg by mouth daily.    Marland Kitchen dexamethasone (DECADRON) 2 MG tablet Take 1 tablet (2 mg total) by mouth 2 (two) times daily. (Patient taking differently: Take 2 mg by mouth daily.) 30 tablet 0  . dexamethasone (DECADRON) 4 MG tablet Take one pill AM & PM for 2 days. TAKE day prior & day AFTER chemo. Do NOT take on day of chemo. 60 tablet 0  . fluticasone (FLONASE) 50 MCG/ACT nasal spray Place 1 spray into both nostrils daily as needed for allergies.    . folic acid (FOLVITE) 1 MG tablet Take 1 tablet (1 mg total) by mouth daily. 90 tablet 1  . glucose blood (ONETOUCH ULTRA)  test strip USE AS DIRECTED TWICE A DAY TO CHECK BLOOD SUGAR    . levETIRAcetam (KEPPRA) 500 MG tablet Take 1 tablet (500 mg total) by mouth 2 (two) times daily. 60 tablet 6  . lidocaine-prilocaine (EMLA) cream Apply 1 application topically as needed. Apply 30-45 mins prior to port access. 30 g 3  . metFORMIN (GLUCOPHAGE-XR) 500 MG 24 hr tablet Take 500 mg by mouth 2 (two) times daily.    . Omega-3 Fatty Acids (OMEGA-3 FISH OIL) 500 MG CAPS Take 500 mg by mouth daily.    . ondansetron (ZOFRAN) 8 MG tablet One pill every 8 hours as needed for nausea/vomitting. 40 tablet 1  . pantoprazole (PROTONIX) 40 MG tablet Take 40 mg by mouth daily.    Marland Kitchen ROCKLATAN 0.02-0.005 % SOLN Place 1 drop into the left eye daily.    . rosuvastatin (CRESTOR)  5 MG tablet Take 5 mg by mouth at bedtime.    . Saw Palmetto, Serenoa repens, (SAW PALMETTO PO) Take 450 mg by mouth daily.     No current facility-administered medications for this visit.      Marland Kitchen  PHYSICAL EXAMINATION: ECOG PERFORMANCE STATUS: 1 - Symptomatic but completely ambulatory  Vitals:   09/26/20 0956  BP: 123/69  Pulse: 72  Resp: 20  Temp: (!) 97.3 F (36.3 C)   Filed Weights   09/26/20 0956  Weight: 196 lb (88.9 kg)    Physical Exam Constitutional:      Comments: Patient is walking independently.  Accompanied by his wife.  HENT:     Head: Normocephalic and atraumatic.     Mouth/Throat:     Pharynx: No oropharyngeal exudate.  Eyes:     Pupils: Pupils are equal, round, and reactive to light.  Cardiovascular:     Rate and Rhythm: Normal rate and regular rhythm.  Pulmonary:     Effort: No respiratory distress.     Breath sounds: No wheezing.     Comments: Decreased breath sounds bilaterally at the bases.  No wheeze or crackles. Abdominal:     General: Bowel sounds are normal. There is no distension.     Palpations: Abdomen is soft. There is no mass.     Tenderness: There is no abdominal tenderness. There is no guarding or rebound.  Musculoskeletal:        General: No tenderness. Normal range of motion.     Cervical back: Normal range of motion and neck supple.  Skin:    General: Skin is warm.  Neurological:     Mental Status: He is alert and oriented to person, place, and time.  Psychiatric:        Mood and Affect: Affect normal.      LABORATORY DATA:  I have reviewed the data as listed Lab Results  Component Value Date   WBC 9.2 09/26/2020   HGB 14.3 09/26/2020   HCT 43.2 09/26/2020   MCV 94.1 09/26/2020   PLT 211 09/26/2020   Recent Labs    08/21/20 1120 09/05/20 0927 09/26/20 0917  NA 137 136 140  K 3.8 4.1 4.3  CL 103 101 103  CO2 25 25 28   GLUCOSE 171* 161* 153*  BUN 18 39* 19  CREATININE 1.41* 1.77* 1.14  CALCIUM 10.0 9.6 9.8   GFRNONAA 53* 40* >60  PROT 7.2  --  6.7  ALBUMIN 3.8  --  3.0*  AST 29  --  32  ALT 30  --  58*  ALKPHOS 45  --  56  BILITOT 0.7  --  0.9    RADIOGRAPHIC STUDIES: I have personally reviewed the radiological images as listed and agreed with the findings in the report. No results found.  ASSESSMENT & PLAN:   Primary cancer of right upper lobe of lung ([place]) #Mediastinal for biopsy-positive for adenocarcinoma TTF-1 positive-suggestive of lung origin.  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  NGS is pending at this time.  #Pending NGS I discussed the role of systemic immunotherapy for palliative reasons.  Patient understands treatments are not curative.  Recommend carbo Alimta-Keytruda every 3 weeks x 4 followed by Keytruda maintenance.   Discussed the potential side effects including but not limited to-increasing fatigue, nausea vomiting, diarrhea, hair loss, sores in the mouth, increase risk of infection and also neuropathy.   I discussed the mechanism of action; The goal of therapy is palliative; and length of treatments are likely ongoing/based upon the results of the scans. Discussed the potential side effects of immunotherapy including but not limited to diarrhea; skin rash; elevated LFTs/endocrine abnormalities etc.  #Brain metastases- on dexamethasone taper x 2 weeks ; s/p WBRT [finished FEB 18th, 2022];  Keppra.  #Diabetes/recommend continued close monitoring of blood sugars while on steroids.  #Bladder nodules-asymptomatic.  Monitor for now.  #Acute renal failure-Baseline creatinine 0.9; creat 1.7-question contrast induced. Improved- stay off  lisinopril hydrochlorothiazide.   # DISPOSITION: # Follow up TBD- Dr.B  Addendum:on 2/25-discussed with the patient the results of the molecular testing negative for PDL 1/or any targetable mutation.  Recommend carbo Alimta-Keytruda.  Recommend chemotherapy education; port placement.  We will plan to start  treatment in about 2 weeks.  Patient agreement.  # 40 minutes face-to-face with the patient discussing the above plan of care; more than 50% of time spent on prognosis/ natural history; counseling and coordination.   All questions were answered. The patient knows to call the clinic with any problems, questions or concerns.    Cammie Sickle, MD 09/29/2020 8:45 PM

## 2020-09-29 ENCOUNTER — Encounter: Payer: Self-pay | Admitting: *Deleted

## 2020-09-29 ENCOUNTER — Telehealth: Payer: Self-pay | Admitting: Internal Medicine

## 2020-09-29 DIAGNOSIS — C3411 Malignant neoplasm of upper lobe, right bronchus or lung: Secondary | ICD-10-CM

## 2020-09-29 MED ORDER — DEXAMETHASONE 4 MG PO TABS: ORAL_TABLET | ORAL | 0 refills | Status: DC

## 2020-09-29 MED ORDER — LIDOCAINE-PRILOCAINE 2.5-2.5 % EX CREA: 1.0000 "application " | TOPICAL_CREAM | CUTANEOUS | 3 refills | Status: AC | PRN

## 2020-09-29 MED ORDER — ONDANSETRON HCL 8 MG PO TABS: ORAL_TABLET | ORAL | 1 refills | Status: AC

## 2020-09-29 MED ORDER — FOLIC ACID 1 MG PO TABS: 1.0000 mg | ORAL_TABLET | Freq: Every day | ORAL | 1 refills | Status: AC

## 2020-09-29 NOTE — Addendum Note (Signed)
Addended by: Gloris Ham on: 09/29/2020 02:07 PM   Modules accepted: Orders

## 2020-09-29 NOTE — Progress Notes (Signed)
START ON PATHWAY REGIMEN - Non-Small Cell Lung     A cycle is every 21 days:     Pembrolizumab      Pemetrexed      Carboplatin   **Always confirm dose/schedule in your pharmacy ordering system**  Patient Characteristics: Stage IV Metastatic, Nonsquamous, Molecular Analysis Completed, Molecular Alteration Present and Targeted Therapy Exhausted OR EGFR Exon 20+ or KRAS G12C+ Present and No Prior Chemo/Immunotherapy OR No Alteration Present, Initial  Chemotherapy/Immunotherapy, PS = 0, 1, No Alteration Present, No Alteration Present, Candidate for Immunotherapy, PD-L1 Expression Positive 1-49% (TPS) / Negative / Not Tested / Awaiting Test Results and Immunotherapy Candidate Therapeutic Status: Stage IV Metastatic Histology: Nonsquamous Cell Broad Molecular Profiling Status: Molecular Analysis Completed Molecular Analysis Results: No Alteration Present ECOG Performance Status: 0 Chemotherapy/Immunotherapy Line of Therapy: Initial Chemotherapy/Immunotherapy EGFR Exons 18-21 Mutation Testing Status: Completed and Negative ALK Fusion/Rearrangement Testing Status: Completed and Negative BRAF V600 Mutation Testing Status: Completed and Negative KRAS G12C Mutation Testing Status: Completed and Negative MET Exon 14 Mutation Testing Status: Completed and Negative RET Fusion/Rearrangement Testing Status: Completed and Negative NTRK Fusion/Rearrangement Testing Status: Completed and Negative ROS1 Fusion/Rearrangement Testing Status: Completed and Negative Immunotherapy Candidate Status: Candidate for Immunotherapy PD-L1 Expression Status: PD-L1 Negative Intent of Therapy: Non-Curative / Palliative Intent, Discussed with Patient

## 2020-09-29 NOTE — Telephone Encounter (Signed)
Port a cath referral placed. Worksheet faxed to speciality services.

## 2020-09-29 NOTE — Telephone Encounter (Signed)
On 2/25-spoke to patient regarding the results of the NGS negative for any targetable mutation.  Patient would benefit from systemic chemotherapy-carboplatin Alimta Keytruda.  Discussed regarding a port; interested.  Chemotherapy education; Mediport referral ASAP  Follow-up on 14th- March- MD: labs- cbc/cmp; Carbo-AlimtaBeryle Flock- GB

## 2020-09-29 NOTE — Addendum Note (Signed)
Addended by: Gloris Ham on: 09/29/2020 02:10 PM   Modules accepted: Orders

## 2020-09-30 ENCOUNTER — Telehealth: Payer: Self-pay

## 2020-09-30 NOTE — Telephone Encounter (Signed)
Called pt to advise of his port placement appointment on 10/10/20 [facility] and to be NPO after midnight. I also advised a nurse would be reaching out closer to appointment and give any additional information. Pt expressed understanding.

## 2020-10-03 ENCOUNTER — Encounter: Payer: Self-pay | Admitting: Internal Medicine

## 2020-10-03 ENCOUNTER — Telehealth: Payer: Self-pay | Admitting: *Deleted

## 2020-10-03 NOTE — Telephone Encounter (Signed)
Spoke with patient. Cancer policy forms are completed and placed at the front receptionist desk. He will pick up forms on Monday when he comes for his chemo ed class.

## 2020-10-03 NOTE — Patient Instructions (Signed)
Pembrolizumab injection What is this medicine? PEMBROLIZUMAB (pem broe liz ue mab) is a monoclonal antibody. It is used to treat certain types of cancer. This medicine may be used for other purposes; ask your health care provider or pharmacist if you have questions. COMMON BRAND NAME(S): Keytruda What should I tell my health care provider before I take this medicine? They need to know if you have any of these conditions:  autoimmune diseases like Crohn's disease, ulcerative colitis, or lupus  have had or planning to have an allogeneic stem cell transplant (uses someone else's stem cells)  history of organ transplant  history of chest radiation  nervous system problems like myasthenia gravis or Guillain-Barre syndrome  an unusual or allergic reaction to pembrolizumab, other medicines, foods, dyes, or preservatives  pregnant or trying to get pregnant  breast-feeding How should I use this medicine? This medicine is for infusion into a vein. It is given by a health care professional in a hospital or clinic setting. A special MedGuide will be given to you before each treatment. Be sure to read this information carefully each time. Talk to your pediatrician regarding the use of this medicine in children. While this drug may be prescribed for children as young as 6 months for selected conditions, precautions do apply. Overdosage: If you think you have taken too much of this medicine contact a poison control center or emergency room at once. NOTE: This medicine is only for you. Do not share this medicine with others. What if I miss a dose? It is important not to miss your dose. Call your doctor or health care professional if you are unable to keep an appointment. What may interact with this medicine? Interactions have not been studied. This list may not describe all possible interactions. Give your health care provider a list of all the medicines, herbs, non-prescription drugs, or dietary  supplements you use. Also tell them if you smoke, drink alcohol, or use illegal drugs. Some items may interact with your medicine. What should I watch for while using this medicine? Your condition will be monitored carefully while you are receiving this medicine. You may need blood work done while you are taking this medicine. Do not become pregnant while taking this medicine or for 4 months after stopping it. Women should inform their doctor if they wish to become pregnant or think they might be pregnant. There is a potential for serious side effects to an unborn child. Talk to your health care professional or pharmacist for more information. Do not breast-feed an infant while taking this medicine or for 4 months after the last dose. What side effects may I notice from receiving this medicine? Side effects that you should report to your doctor or health care professional as soon as possible:  allergic reactions like skin rash, itching or hives, swelling of the face, lips, or tongue  bloody or black, tarry  breathing problems  changes in vision  chest pain  chills  confusion  constipation  cough  diarrhea  dizziness or feeling faint or lightheaded  fast or irregular heartbeat  fever  flushing  joint pain  low blood counts - this medicine may decrease the number of white blood cells, red blood cells and platelets. You may be at increased risk for infections and bleeding.  muscle pain  muscle weakness  pain, tingling, numbness in the hands or feet  persistent headache  redness, blistering, peeling or loosening of the skin, including inside the mouth  signs and   symptoms of high blood sugar such as dizziness; dry mouth; dry skin; fruity breath; nausea; stomach pain; increased hunger or thirst; increased urination  signs and symptoms of kidney injury like trouble passing urine or change in the amount of urine  signs and symptoms of liver injury like dark urine,  light-colored stools, loss of appetite, nausea, right upper belly pain, yellowing of the eyes or skin  sweating  swollen lymph nodes  weight loss Side effects that usually do not require medical attention (report to your doctor or health care professional if they continue or are bothersome):  decreased appetite  hair loss  tiredness This list may not describe all possible side effects. Call your doctor for medical advice about side effects. You may report side effects to FDA at 1-800-FDA-1088. Where should I keep my medicine? This drug is given in a hospital or clinic and will not be stored at home. NOTE: This sheet is a summary. It may not cover all possible information. If you have questions about this medicine, talk to your doctor, pharmacist, or health care provider.  2021 Elsevier/Gold Standard (2019-06-20 21:44:53) Pemetrexed injection What is this medicine? PEMETREXED (PEM e TREX ed) is a chemotherapy drug used to treat lung cancers like non-small cell lung cancer and mesothelioma. It may also be used to treat other cancers. This medicine may be used for other purposes; ask your health care provider or pharmacist if you have questions. COMMON BRAND NAME(S): Alimta What should I tell my health care provider before I take this medicine? They need to know if you have any of these conditions:  infection (especially a virus infection such as chickenpox, cold sores, or herpes)  kidney disease  low blood counts, like low white cell, platelet, or red cell counts  lung or breathing disease, like asthma  radiation therapy  an unusual or allergic reaction to pemetrexed, other medicines, foods, dyes, or preservative  pregnant or trying to get pregnant  breast-feeding How should I use this medicine? This drug is given as an infusion into a vein. It is administered in a hospital or clinic by a specially trained health care professional. Talk to your pediatrician regarding the  use of this medicine in children. Special care may be needed. Overdosage: If you think you have taken too much of this medicine contact a poison control center or emergency room at once. NOTE: This medicine is only for you. Do not share this medicine with others. What if I miss a dose? It is important not to miss your dose. Call your doctor or health care professional if you are unable to keep an appointment. What may interact with this medicine? This medicine may interact with the following medications:  Ibuprofen This list may not describe all possible interactions. Give your health care provider a list of all the medicines, herbs, non-prescription drugs, or dietary supplements you use. Also tell them if you smoke, drink alcohol, or use illegal drugs. Some items may interact with your medicine. What should I watch for while using this medicine? Visit your doctor for checks on your progress. This drug may make you feel generally unwell. This is not uncommon, as chemotherapy can affect healthy cells as well as cancer cells. Report any side effects. Continue your course of treatment even though you feel ill unless your doctor tells you to stop. In some cases, you may be given additional medicines to help with side effects. Follow all directions for their use. Call your doctor or health care   professional for advice if you get a fever, chills or sore throat, or other symptoms of a cold or flu. Do not treat yourself. This drug decreases your body's ability to fight infections. Try to avoid being around people who are sick. This medicine may increase your risk to bruise or bleed. Call your doctor or health care professional if you notice any unusual bleeding. Be careful brushing and flossing your teeth or using a toothpick because you may get an infection or bleed more easily. If you have any dental work done, tell your dentist you are receiving this medicine. Avoid taking products that contain aspirin,  acetaminophen, ibuprofen, naproxen, or ketoprofen unless instructed by your doctor. These medicines may hide a fever. Call your doctor or health care professional if you get diarrhea or mouth sores. Do not treat yourself. To protect your kidneys, drink water or other fluids as directed while you are taking this medicine. Do not become pregnant while taking this medicine or for 6 months after stopping it. Women should inform their doctor if they wish to become pregnant or think they might be pregnant. Men should not father a child while taking this medicine and for 3 months after stopping it. This may interfere with the ability to father a child. You should talk to your doctor or health care professional if you are concerned about your fertility. There is a potential for serious side effects to an unborn child. Talk to your health care professional or pharmacist for more information. Do not breast-feed an infant while taking this medicine or for 1 week after stopping it. What side effects may I notice from receiving this medicine? Side effects that you should report to your doctor or health care professional as soon as possible:  allergic reactions like skin rash, itching or hives, swelling of the face, lips, or tongue  breathing problems  redness, blistering, peeling or loosening of the skin, including inside the mouth  signs and symptoms of bleeding such as bloody or black, tarry stools; red or dark-brown urine; spitting up blood or brown material that looks like coffee grounds; red spots on the skin; unusual bruising or bleeding from the eye, gums, or nose  signs and symptoms of infection like fever or chills; cough; sore throat; pain or trouble passing urine  signs and symptoms of kidney injury like trouble passing urine or change in the amount of urine  signs and symptoms of liver injury like dark yellow or brown urine; general ill feeling or flu-like symptoms; light-colored stools; loss of  appetite; nausea; right upper belly pain; unusually weak or tired; yellowing of the eyes or skin Side effects that usually do not require medical attention (report to your doctor or health care professional if they continue or are bothersome):  constipation  mouth sores  nausea, vomiting  unusually weak or tired This list may not describe all possible side effects. Call your doctor for medical advice about side effects. You may report side effects to FDA at 1-800-FDA-1088. Where should I keep my medicine? This drug is given in a hospital or clinic and will not be stored at home. NOTE: This sheet is a summary. It may not cover all possible information. If you have questions about this medicine, talk to your doctor, pharmacist, or health care provider.  2021 Elsevier/Gold Standard (2017-09-07 16:11:33) Carboplatin injection What is this medicine? CARBOPLATIN (KAR boe pla tin) is a chemotherapy drug. It targets fast dividing cells, like cancer cells, and causes these cells to   die. This medicine is used to treat ovarian cancer and many other cancers. This medicine may be used for other purposes; ask your health care provider or pharmacist if you have questions. COMMON BRAND NAME(S): Paraplatin What should I tell my health care provider before I take this medicine? They need to know if you have any of these conditions:  blood disorders  hearing problems  kidney disease  recent or ongoing radiation therapy  an unusual or allergic reaction to carboplatin, cisplatin, other chemotherapy, other medicines, foods, dyes, or preservatives  pregnant or trying to get pregnant  breast-feeding How should I use this medicine? This drug is usually given as an infusion into a vein. It is administered in a hospital or clinic by a specially trained health care professional. Talk to your pediatrician regarding the use of this medicine in children. Special care may be needed. Overdosage: If you think  you have taken too much of this medicine contact a poison control center or emergency room at once. NOTE: This medicine is only for you. Do not share this medicine with others. What if I miss a dose? It is important not to miss a dose. Call your doctor or health care professional if you are unable to keep an appointment. What may interact with this medicine?  medicines for seizures  medicines to increase blood counts like filgrastim, pegfilgrastim, sargramostim  some antibiotics like amikacin, gentamicin, neomycin, streptomycin, tobramycin  vaccines Talk to your doctor or health care professional before taking any of these medicines:  acetaminophen  aspirin  ibuprofen  ketoprofen  naproxen This list may not describe all possible interactions. Give your health care provider a list of all the medicines, herbs, non-prescription drugs, or dietary supplements you use. Also tell them if you smoke, drink alcohol, or use illegal drugs. Some items may interact with your medicine. What should I watch for while using this medicine? Your condition will be monitored carefully while you are receiving this medicine. You will need important blood work done while you are taking this medicine. This drug may make you feel generally unwell. This is not uncommon, as chemotherapy can affect healthy cells as well as cancer cells. Report any side effects. Continue your course of treatment even though you feel ill unless your doctor tells you to stop. In some cases, you may be given additional medicines to help with side effects. Follow all directions for their use. Call your doctor or health care professional for advice if you get a fever, chills or sore throat, or other symptoms of a cold or flu. Do not treat yourself. This drug decreases your body's ability to fight infections. Try to avoid being around people who are sick. This medicine may increase your risk to bruise or bleed. Call your doctor or health  care professional if you notice any unusual bleeding. Be careful brushing and flossing your teeth or using a toothpick because you may get an infection or bleed more easily. If you have any dental work done, tell your dentist you are receiving this medicine. Avoid taking products that contain aspirin, acetaminophen, ibuprofen, naproxen, or ketoprofen unless instructed by your doctor. These medicines may hide a fever. Do not become pregnant while taking this medicine. Women should inform their doctor if they wish to become pregnant or think they might be pregnant. There is a potential for serious side effects to an unborn child. Talk to your health care professional or pharmacist for more information. Do not breast-feed an infant while taking   this medicine. What side effects may I notice from receiving this medicine? Side effects that you should report to your doctor or health care professional as soon as possible:  allergic reactions like skin rash, itching or hives, swelling of the face, lips, or tongue  signs of infection - fever or chills, cough, sore throat, pain or difficulty passing urine  signs of decreased platelets or bleeding - bruising, pinpoint red spots on the skin, black, tarry stools, nosebleeds  signs of decreased red blood cells - unusually weak or tired, fainting spells, lightheadedness  breathing problems  changes in hearing  changes in vision  chest pain  high blood pressure  low blood counts - This drug may decrease the number of white blood cells, red blood cells and platelets. You may be at increased risk for infections and bleeding.  nausea and vomiting  pain, swelling, redness or irritation at the injection site  pain, tingling, numbness in the hands or feet  problems with balance, talking, walking  trouble passing urine or change in the amount of urine Side effects that usually do not require medical attention (report to your doctor or health care  professional if they continue or are bothersome):  hair loss  loss of appetite  metallic taste in the mouth or changes in taste This list may not describe all possible side effects. Call your doctor for medical advice about side effects. You may report side effects to FDA at 1-800-FDA-1088. Where should I keep my medicine? This drug is given in a hospital or clinic and will not be stored at home. NOTE: This sheet is a summary. It may not cover all possible information. If you have questions about this medicine, talk to your doctor, pharmacist, or health care provider.  2021 Elsevier/Gold Standard (2007-10-24 14:38:05)  

## 2020-10-06 ENCOUNTER — Inpatient Hospital Stay: Payer: Medicare PPO | Attending: Internal Medicine

## 2020-10-06 ENCOUNTER — Other Ambulatory Visit: Payer: Self-pay

## 2020-10-06 DIAGNOSIS — C7931 Secondary malignant neoplasm of brain: Secondary | ICD-10-CM | POA: Insufficient documentation

## 2020-10-06 DIAGNOSIS — Z79899 Other long term (current) drug therapy: Secondary | ICD-10-CM | POA: Insufficient documentation

## 2020-10-06 DIAGNOSIS — Z7984 Long term (current) use of oral hypoglycemic drugs: Secondary | ICD-10-CM | POA: Insufficient documentation

## 2020-10-06 DIAGNOSIS — Z5111 Encounter for antineoplastic chemotherapy: Secondary | ICD-10-CM | POA: Insufficient documentation

## 2020-10-06 DIAGNOSIS — K279 Peptic ulcer, site unspecified, unspecified as acute or chronic, without hemorrhage or perforation: Secondary | ICD-10-CM | POA: Insufficient documentation

## 2020-10-06 DIAGNOSIS — Z87891 Personal history of nicotine dependence: Secondary | ICD-10-CM | POA: Insufficient documentation

## 2020-10-06 DIAGNOSIS — Z923 Personal history of irradiation: Secondary | ICD-10-CM | POA: Insufficient documentation

## 2020-10-06 DIAGNOSIS — K219 Gastro-esophageal reflux disease without esophagitis: Secondary | ICD-10-CM | POA: Insufficient documentation

## 2020-10-06 DIAGNOSIS — C3411 Malignant neoplasm of upper lobe, right bronchus or lung: Secondary | ICD-10-CM | POA: Insufficient documentation

## 2020-10-06 DIAGNOSIS — B369 Superficial mycosis, unspecified: Secondary | ICD-10-CM | POA: Insufficient documentation

## 2020-10-06 DIAGNOSIS — E7800 Pure hypercholesterolemia, unspecified: Secondary | ICD-10-CM | POA: Insufficient documentation

## 2020-10-06 DIAGNOSIS — I10 Essential (primary) hypertension: Secondary | ICD-10-CM | POA: Insufficient documentation

## 2020-10-06 DIAGNOSIS — E1151 Type 2 diabetes mellitus with diabetic peripheral angiopathy without gangrene: Secondary | ICD-10-CM | POA: Insufficient documentation

## 2020-10-06 DIAGNOSIS — E1165 Type 2 diabetes mellitus with hyperglycemia: Secondary | ICD-10-CM | POA: Insufficient documentation

## 2020-10-06 DIAGNOSIS — C771 Secondary and unspecified malignant neoplasm of intrathoracic lymph nodes: Secondary | ICD-10-CM | POA: Insufficient documentation

## 2020-10-08 ENCOUNTER — Telehealth: Payer: Self-pay | Admitting: *Deleted

## 2020-10-08 NOTE — Telephone Encounter (Signed)
Patient called to clarify the instructions for the decadron prescription and keppra. He stated that he was informed by Dr. B to stop the Keppra as this was no longer needed. He received the new script last week of the decadron to take prior to the chemotherapy. He stated that he was confused of which instructions to follow as he was previously tapering down from the steroids. He will complete this dosing of decadron today. He has also been gradually tapering down on the Keppra as directed by Dr. Rogue Bussing. I contacted Dr. Rogue Bussing. Patient should stop the taper decadron and the Keppra as directed.  I explained to him that Dr. B sent the new script for decadron on 2/28. He should start this precription the day before his scheduled chemos and take after the day of chemotherapy. Reviewed with the patient to take Decadron 4 mg one pill in the am and pm on the day prior to chemo and the day after chemotherapy. Teach back process performed with patient.  He also inquired of the arrival time for the port placement. I confirmed with the patient that he should arrive at 9:30 am on 3/11.

## 2020-10-09 ENCOUNTER — Other Ambulatory Visit: Payer: Self-pay | Admitting: Student

## 2020-10-10 ENCOUNTER — Ambulatory Visit
Admission: RE | Admit: 2020-10-10 | Discharge: 2020-10-10 | Disposition: A | Discharge status: Home / Self Care | Payer: Medicare PPO | Source: Ambulatory Visit | Attending: Internal Medicine | Admitting: Internal Medicine

## 2020-10-10 ENCOUNTER — Other Ambulatory Visit: Payer: Self-pay

## 2020-10-10 DIAGNOSIS — Z79899 Other long term (current) drug therapy: Secondary | ICD-10-CM | POA: Insufficient documentation

## 2020-10-10 DIAGNOSIS — Z7984 Long term (current) use of oral hypoglycemic drugs: Secondary | ICD-10-CM | POA: Diagnosis not present

## 2020-10-10 DIAGNOSIS — Z87891 Personal history of nicotine dependence: Secondary | ICD-10-CM | POA: Insufficient documentation

## 2020-10-10 DIAGNOSIS — Z7982 Long term (current) use of aspirin: Secondary | ICD-10-CM | POA: Insufficient documentation

## 2020-10-10 DIAGNOSIS — C3411 Malignant neoplasm of upper lobe, right bronchus or lung: Secondary | ICD-10-CM | POA: Diagnosis not present

## 2020-10-10 HISTORY — PX: IR IMAGING GUIDED PORT INSERTION: IMG5740

## 2020-10-10 LAB — GLUCOSE, CAPILLARY: Glucose-Capillary: 158 mg/dL — ABNORMAL HIGH (ref 70–99)

## 2020-10-10 IMAGING — US IR IMAGING GUIDED PORT INSERTION
3 series · 4 of 4 positions shown · non-contrast
Comparison: none

INDICATION: Right chest port placement

[Series 1: ir fluoro/shunt/fist · 2 of 2 slices shown]
[im 1/2]
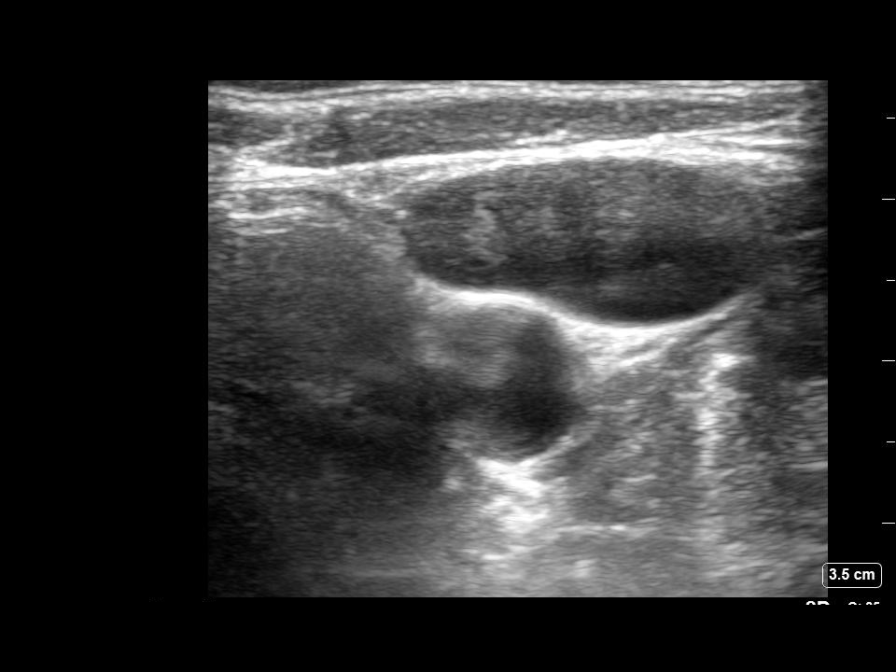
[im 2/2]
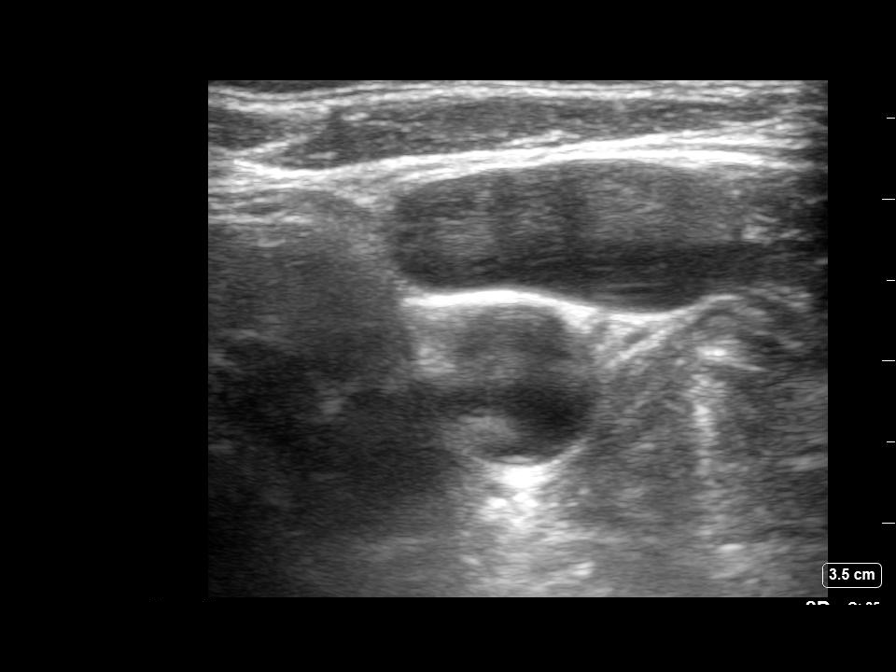

[Series 1: subclavian 3 fps · 1 of 1 slices shown (1 of 2)]
[im 1/1]
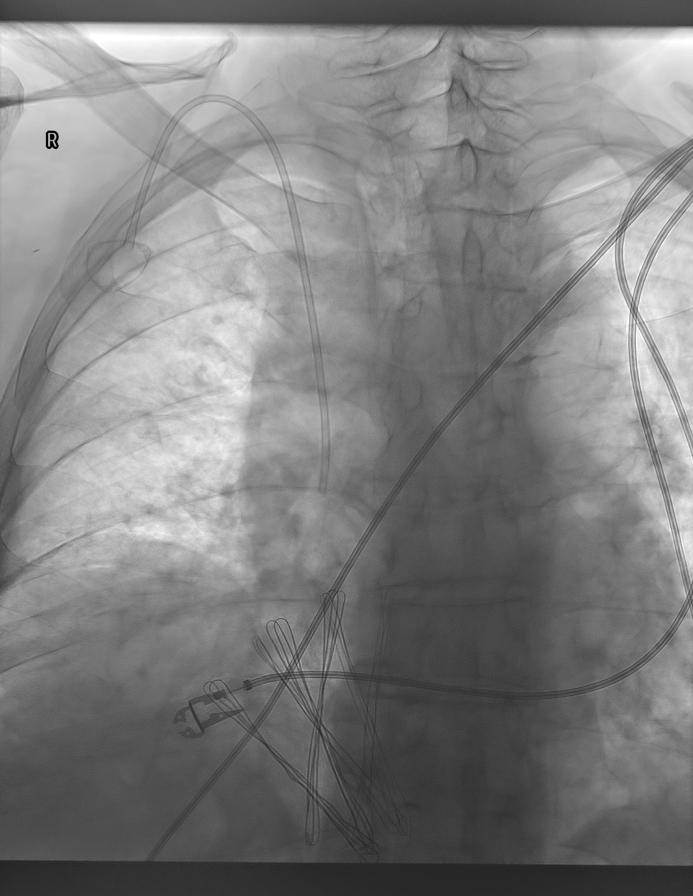

[Series 1: subclavian 3 fps · 1 of 1 slices shown (2 of 2)]
[im 1/1]
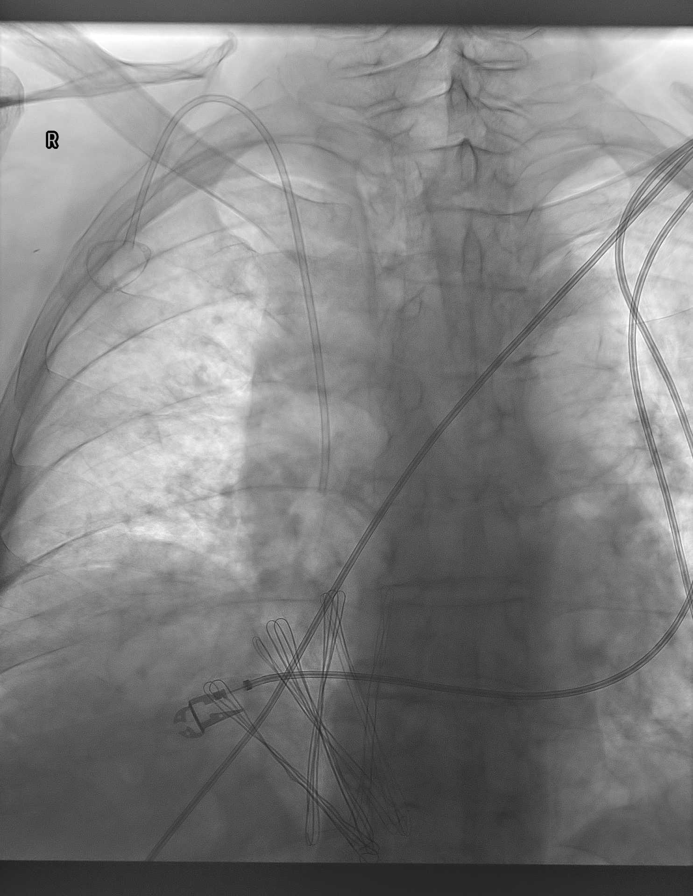

[4 of 4 positions shown; findings below may reference images not displayed]

EXAM:
IMPLANTED PORT A CATH PLACEMENT WITH ULTRASOUND AND FLUOROSCOPIC
GUIDANCE

MEDICATIONS:
None

ANESTHESIA/SEDATION:
Moderate (conscious) sedation was employed during this procedure. A
total of Versed 2 mg and Fentanyl 100 mcg was administered
intravenously.

Moderate Sedation Time: 15 minutes. The patient's level of
consciousness and vital signs were monitored continuously by
radiology nursing throughout the procedure under my direct
supervision.

FLUOROSCOPY TIME:  0.3 minutes (4.92 mGy)

COMPLICATIONS:
None immediate.

PROCEDURE:
The procedure, risks, benefits, and alternatives were explained to
the patient. Questions regarding the procedure were encouraged and
answered. The patient understands and consents to the procedure.

A timeout was performed prior to the initiation of the procedure.

Patient positioned supine on the angiography table.

Right neck and anterior upper chest prepped and draped in the usual
sterile fashion. All elements of maximal sterile barrier were
utilized including, cap, mask, sterile gown, sterile gloves, large
sterile drape, hand scrubbing and 2% Chlorhexidine for skin
cleaning.

The right internal jugular vein was evaluated with ultrasound and
shown to be patent. A permanent ultrasound image was obtained and
placed in the patient's medical record. Local anesthesia was
provided with 1% lidocaine with epinephrine.

Using sterile gel and a sterile probe cover, the right internal
jugular vein was entered with a 21 ga needle during real time
ultrasound guidance.

0.018 inch guidewire placed and 21 ga needle exchanged for
transitional dilator set. Utilizing fluoroscopy, 0.035 inch
guidewire advanced through the needle without difficulty.

Attention then turned to the right anterior upper chest. Following
local lidocaine administration, a port pocket was created. The
catheter was connected to the port and brought from the pocket to
the venotomy site through a subcutaneous tunnel.

The catheter was cut to size and inserted through the peel-away
sheath. The catheter tip was positioned at the cavoatrial junction
using fluoroscopic guidance.

The port aspirated and flushed well. The port pocket was closed with
deep and superficial absorbable suture. The port pocket incision and
venotomy sites were also sealed with Dermabond.
IMPRESSION: Successful placement of a right internal jugular approach power
injectable Port-A-Cath. The catheter is ready for immediate use.

## 2020-10-10 MED ORDER — FENTANYL CITRATE (PF) 100 MCG/2ML IJ SOLN
INTRAMUSCULAR | Status: AC
  Filled 2020-10-10: qty 2

## 2020-10-10 MED ORDER — SODIUM CHLORIDE 0.9 % IV SOLN
INTRAVENOUS | Status: DC
  Administered 2020-10-10: 1000 mL via INTRAVENOUS

## 2020-10-10 MED ORDER — FENTANYL CITRATE (PF) 100 MCG/2ML IJ SOLN
INTRAMUSCULAR | Status: AC | PRN
  Administered 2020-10-10 (×2): 50 ug via INTRAVENOUS

## 2020-10-10 MED ORDER — MIDAZOLAM HCL 2 MG/2ML IJ SOLN
INTRAMUSCULAR | Status: AC
  Filled 2020-10-10: qty 2

## 2020-10-10 MED ORDER — MIDAZOLAM HCL 2 MG/2ML IJ SOLN
INTRAMUSCULAR | Status: AC | PRN
  Administered 2020-10-10 (×2): 1 mg via INTRAVENOUS

## 2020-10-10 NOTE — H&P (Addendum)
Chief Complaint: Patient was seen in consultation today for port-a-catheter placement   Referring Physician(s): Cammie Sickle  Supervising Physician: Mir, Sharen Heck  Patient Status: [facility] - Out-pt  History of Present Illness: Alejandro Randolph is a 75 y.o. male with a medical history significant for HTN, PVD and DM.  He presented to his PCP in January 2022 for headaches, nausea and vomiting. MRI imaging showed multiple brain lesions concerning for metastatic malignancy. After further work up he was ultimately diagnosed with Stage IV lung cancer. His treatment team is preparing him for whole brain radiation and chemotherapy.   Interventional Radiology has been asked to evaluate this patient for an image-guided port-a-catheter placement to facilitate his treatment plans.   Past Medical History:  Diagnosis Date  . Abscess of sigmoid colon   . Cancer [facility])    brain tumor  . Diabetes mellitus without complication ([place])   . GERD (gastroesophageal reflux disease)   . Hypercholesteremia   . Hypertension   . Peripheral vascular disease ([place])   . PUD (peptic ulcer disease)     Past Surgical History:  Procedure Laterality Date  . CHOLECYSTECTOMY    . COLONOSCOPY WITH PROPOFOL N/A 07/19/2016   Procedure: COLONOSCOPY WITH PROPOFOL;  Surgeon: Jonathon Bellows, MD;  Location: [facility] ORS;  Service: Endoscopy;  Laterality: N/A;  . COLONOSCOPY WITH PROPOFOL N/A 09/02/2016   Procedure: COLONOSCOPY WITH PROPOFOL;  Surgeon: Jonathon Bellows, MD;  Location: [facility] ENDOSCOPY;  Service: Endoscopy;  Laterality: N/A;  . COLONOSCOPY WITH PROPOFOL N/A 09/04/2020   Procedure: COLONOSCOPY WITH PROPOFOL;  Surgeon: Jonathon Bellows, MD;  Location: [facility] ENDOSCOPY;  Service: Gastroenterology;  Laterality: N/A;  . femoral stents Bilateral   . FLEXIBLE SIGMOIDOSCOPY  07/19/2016   Procedure: FLEXIBLE SIGMOIDOSCOPY;  Surgeon: Jonathon Bellows, MD;  Location: [facility] ORS;  Service: Endoscopy;;  . HERNIA REPAIR    . RETINAL DETACHMENT  SURGERY    . TONSILLECTOMY    . VIDEO BRONCHOSCOPY WITH ENDOBRONCHIAL ULTRASOUND N/A 09/01/2020   Procedure: VIDEO BRONCHOSCOPY WITH ENDOBRONCHIAL ULTRASOUND;  Surgeon: Ottie Glazier, MD;  Location: [facility] ORS;  Service: Thoracic;  Laterality: N/A;    Allergies: Atorvastatin  Medications: Prior to Admission medications   Medication Sig Start Date End Date Taking? Authorizing Provider  amLODipine (NORVASC) 10 MG tablet Take 10 mg by mouth daily. 06/23/16  Yes [provider]  aspirin EC 81 MG tablet Take 81 mg by mouth daily.   Yes [provider]  folic acid (FOLVITE) 1 MG tablet Take 1 tablet (1 mg total) by mouth daily. 09/29/20  Yes Cammie Sickle, MD  metFORMIN (GLUCOPHAGE-XR) 500 MG 24 hr tablet Take 500 mg by mouth 2 (two) times daily. 06/05/16  Yes [provider]  Omega-3 Fatty Acids (OMEGA-3 FISH OIL) 500 MG CAPS Take 500 mg by mouth daily.   Yes [provider]  pantoprazole (PROTONIX) 40 MG tablet Take 40 mg by mouth daily. 05/25/16  Yes [provider]  ROCKLATAN 0.02-0.005 % SOLN Place 1 drop into the left eye daily. 08/17/19  Yes [provider]  rosuvastatin (CRESTOR) 5 MG tablet Take 5 mg by mouth at bedtime. 05/10/16  Yes [provider]  Saw Palmetto, Serenoa repens, (SAW PALMETTO PO) Take 450 mg by mouth daily.   Yes [provider]  dexamethasone (DECADRON) 4 MG tablet Take one pill AM & PM for 2 days. TAKE day prior & day AFTER chemo. Do NOT take on day of chemo. 09/29/20   Cammie Sickle,  MD  fluticasone (FLONASE) 50 MCG/ACT nasal spray Place 1 spray into both nostrils daily as needed for allergies. 07/08/20   [provider]  glucose blood (ONETOUCH ULTRA) test strip USE AS DIRECTED TWICE A DAY TO CHECK BLOOD SUGAR 03/02/19   [provider]  lidocaine-prilocaine (EMLA) cream Apply 1 application topically as needed. Apply 30-45 mins prior to port access. 09/29/20   Cammie Sickle, MD  ondansetron (ZOFRAN) 8 MG tablet One pill every 8 hours as needed for nausea/vomitting. 09/29/20   Cammie Sickle, MD     Family History  Problem Relation Age of Onset  . Diabetes Mother   . Hypertension Mother   . Diabetes Father     Social History   Socioeconomic History  . Marital status: Married    Spouse name: Katharine Look  . Number of children: Not on file  . Years of education: Not on file  . Highest education level: Not on file  Occupational History    Comment: retired  Tobacco Use  . Smoking status: Former Smoker    Quit date: 2001    Years since quitting: 21.2  . Smokeless tobacco: Never Used  Vaping Use  . Vaping Use: Never used  Substance and Sexual Activity  . Alcohol use: No  . Drug use: No  . Sexual activity: Not on file  Other Topics Concern  . Not on file  Social History Narrative   Lives in [place]; lives with wife; 1 son- lives close by.[id number]- Quit smoking- 2001. No Alcohol.    Feels safe in his home.   Social Determinants of Health   Financial Resource Strain: Not on file  Food Insecurity: Not on file  Transportation Needs: Not on file  Physical Activity: Not on file  Stress: Not on file  Social Connections: Not on file    Review of Systems: A 12 point ROS discussed and pertinent positives are indicated in the HPI above.  All other systems are negative.  Review of Systems  Constitutional: Positive for fatigue. Negative for appetite change.  Respiratory: Negative for cough and shortness of breath.   Cardiovascular: Negative for chest pain and leg swelling.  Gastrointestinal: Negative for abdominal pain, diarrhea, nausea and vomiting.  Musculoskeletal: Negative for back pain.  Neurological: Negative for dizziness and headaches.    Vital Signs: BP 135/65   Pulse 62   Temp 98 F (36.7 C) (Oral)   Resp 18   Ht 5\' 9"  (1.753 m)   Wt 195 lb (88.5 kg)   SpO2 96%   BMI 28.80 kg/m   Physical Exam Constitutional:       General: He is not in acute distress. HENT:     Mouth/Throat:     Mouth: Mucous membranes are moist.     Pharynx: Oropharynx is clear.  Cardiovascular:     Rate and Rhythm: Normal rate and regular rhythm.     Pulses: Normal pulses.     Heart sounds: Normal heart sounds.  Pulmonary:     Effort: Pulmonary effort is normal.     Breath sounds: Normal breath sounds.  Abdominal:     General: Bowel sounds are normal.     Palpations: Abdomen is soft.  Musculoskeletal:        General: Normal range of motion.  Skin:    General: Skin is warm and dry.  Neurological:     Mental Status: He is alert and oriented to person, place, and time.  Psychiatric:  Mood and Affect: Mood normal.        Behavior: Behavior normal.        Thought Content: Thought content normal.        Judgment: Judgment normal.     Imaging: No results found.  Labs:  CBC: Recent Labs    08/12/20 1631 08/21/20 1120 09/05/20 0927 09/26/20 0917  WBC 10.9* 8.4 13.8* 9.2  HGB 14.5 14.4 15.6 14.3  HCT 42.8 41.7 45.0 43.2  PLT 274 259 260 211    COAGS: No results for input(s): INR, APTT in the last 8760 hours.  BMP: Recent Labs    08/21/20 1120 09/05/20 0927 09/26/20 0917  NA 137 136 140  K 3.8 4.1 4.3  CL 103 101 103  CO2 25 25 28   GLUCOSE 171* 161* 153*  BUN 18 39* 19  CALCIUM 10.0 9.6 9.8  CREATININE 1.41* 1.77* 1.14  GFRNONAA 53* 40* >60    LIVER FUNCTION TESTS: Recent Labs    08/21/20 1120 09/26/20 0917  BILITOT 0.7 0.9  AST 29 32  ALT 30 58*  ALKPHOS 45 56  PROT 7.2 6.7  ALBUMIN 3.8 3.0*    TUMOR MARKERS: No results for input(s): AFPTM, CEA, CA199, CHROMGRNA in the last 8760 hours.  Assessment and Plan:  Metastatic lung cancer: Helix L. Kinsella, 75 year old male, presents today to the [facility] Interventional Radiology department for an image-guided port-a-catheter placement for chemotherapy.   Risks and benefits of image-guided Port-a-catheter  placement were discussed with the patient including, but not limited to bleeding, infection, pneumothorax, or fibrin sheath development and need for additional procedures.  All of the patient's questions were answered, patient is agreeable to proceed. He has been NPO.   Consent signed and in chart.   Thank you for this interesting consult.  I greatly enjoyed meeting Alejandro Randolph and look forward to participating in their care.  A copy of this report was sent to the requesting provider on this date.  Electronically Signed: Soyla Dryer, AGACNP-BC [phone] 10/10/2020, 10:21 AM   I spent a total of  30 Minutes   in face to face in clinical consultation, greater than 50% of which was counseling/coordinating care for port-a-catheter placement

## 2020-10-10 NOTE — Procedures (Signed)
Interventional Radiology Procedure Note  Procedure: Chest port  Indication: Metastatic malignancy  Findings: Please refer to procedural dictation for full description.  Complications: None  EBL: < 10 mL  Miachel Roux, MD [phone]

## 2020-10-13 ENCOUNTER — Encounter: Payer: Self-pay | Admitting: Internal Medicine

## 2020-10-13 ENCOUNTER — Other Ambulatory Visit: Payer: Self-pay

## 2020-10-13 ENCOUNTER — Encounter: Payer: Self-pay | Admitting: *Deleted

## 2020-10-13 ENCOUNTER — Inpatient Hospital Stay: Payer: Medicare PPO

## 2020-10-13 ENCOUNTER — Inpatient Hospital Stay: Payer: Medicare PPO | Admitting: Internal Medicine

## 2020-10-13 DIAGNOSIS — C771 Secondary and unspecified malignant neoplasm of intrathoracic lymph nodes: Secondary | ICD-10-CM | POA: Diagnosis not present

## 2020-10-13 DIAGNOSIS — C3411 Malignant neoplasm of upper lobe, right bronchus or lung: Secondary | ICD-10-CM

## 2020-10-13 DIAGNOSIS — Z5111 Encounter for antineoplastic chemotherapy: Secondary | ICD-10-CM | POA: Diagnosis not present

## 2020-10-13 DIAGNOSIS — E1165 Type 2 diabetes mellitus with hyperglycemia: Secondary | ICD-10-CM | POA: Diagnosis not present

## 2020-10-13 DIAGNOSIS — K219 Gastro-esophageal reflux disease without esophagitis: Secondary | ICD-10-CM | POA: Diagnosis not present

## 2020-10-13 DIAGNOSIS — Z87891 Personal history of nicotine dependence: Secondary | ICD-10-CM | POA: Diagnosis not present

## 2020-10-13 DIAGNOSIS — Z79899 Other long term (current) drug therapy: Secondary | ICD-10-CM | POA: Diagnosis not present

## 2020-10-13 DIAGNOSIS — K279 Peptic ulcer, site unspecified, unspecified as acute or chronic, without hemorrhage or perforation: Secondary | ICD-10-CM | POA: Diagnosis not present

## 2020-10-13 DIAGNOSIS — B369 Superficial mycosis, unspecified: Secondary | ICD-10-CM | POA: Diagnosis not present

## 2020-10-13 DIAGNOSIS — E1151 Type 2 diabetes mellitus with diabetic peripheral angiopathy without gangrene: Secondary | ICD-10-CM | POA: Diagnosis not present

## 2020-10-13 DIAGNOSIS — E7800 Pure hypercholesterolemia, unspecified: Secondary | ICD-10-CM | POA: Diagnosis not present

## 2020-10-13 DIAGNOSIS — Z923 Personal history of irradiation: Secondary | ICD-10-CM | POA: Diagnosis not present

## 2020-10-13 DIAGNOSIS — C7931 Secondary malignant neoplasm of brain: Secondary | ICD-10-CM | POA: Diagnosis not present

## 2020-10-13 DIAGNOSIS — I10 Essential (primary) hypertension: Secondary | ICD-10-CM | POA: Diagnosis not present

## 2020-10-13 DIAGNOSIS — Z7984 Long term (current) use of oral hypoglycemic drugs: Secondary | ICD-10-CM | POA: Diagnosis not present

## 2020-10-13 LAB — COMPREHENSIVE METABOLIC PANEL
ALT: 21 U/L (ref 0–44)
AST: 17 U/L (ref 15–41)
Albumin: 3 g/dL — ABNORMAL LOW (ref 3.5–5.0)
Alkaline Phosphatase: 49 U/L (ref 38–126)
Anion gap: 10 (ref 5–15)
BUN: 21 mg/dL (ref 8–23)
CO2: 21 mmol/L — ABNORMAL LOW (ref 22–32)
Calcium: 9.8 mg/dL (ref 8.9–10.3)
Chloride: 105 mmol/L (ref 98–111)
Creatinine, Ser: 1.24 mg/dL (ref 0.61–1.24)
GFR, Estimated: >60 mL/min (ref >60)
Glucose, Bld: 353 mg/dL — ABNORMAL HIGH (ref 70–99)
Potassium: 3.9 mmol/L (ref 3.5–5.1)
Sodium: 136 mmol/L (ref 135–145)
Total Bilirubin: 1 mg/dL (ref 0.3–1.2)
Total Protein: 6.2 g/dL — ABNORMAL LOW (ref 6.5–8.1)

## 2020-10-13 LAB — CBC WITH DIFFERENTIAL/PLATELET
Abs Immature Granulocytes: 0.12 10*3/uL — ABNORMAL HIGH (ref 0.00–0.07)
Basophils Absolute: 0 10*3/uL (ref 0.0–0.1)
Basophils Relative: 0 %
Eosinophils Absolute: 0 10*3/uL (ref 0.0–0.5)
Eosinophils Relative: 0 %
HCT: 39.7 % (ref 39.0–52.0)
Hemoglobin: 13.6 g/dL (ref 13.0–17.0)
Immature Granulocytes: 1 %
Lymphocytes Relative: 4 %
Lymphs Abs: 0.5 10*3/uL — ABNORMAL LOW (ref 0.7–4.0)
MCH: 32 pg (ref 26.0–34.0)
MCHC: 34.3 g/dL (ref 30.0–36.0)
MCV: 93.4 fL (ref 80.0–100.0)
Monocytes Absolute: 0.6 10*3/uL (ref 0.1–1.0)
Monocytes Relative: 5 %
Neutro Abs: 9.9 10*3/uL — ABNORMAL HIGH (ref 1.7–7.7)
Neutrophils Relative %: 90 %
Platelets: 206 10*3/uL (ref 150–400)
RBC: 4.25 MIL/uL (ref 4.22–5.81)
RDW: 14.1 % (ref 11.5–15.5)
WBC: 11 10*3/uL — ABNORMAL HIGH (ref 4.0–10.5)
nRBC: 0 % (ref 0.0–0.2)

## 2020-10-13 LAB — TSH: TSH: 0.895 u[IU]/mL (ref 0.350–4.500)

## 2020-10-13 MED ORDER — DEXAMETHASONE SODIUM PHOSPHATE 10 MG/ML IJ SOLN
4.0000 mg | Freq: Once | INTRAMUSCULAR | Status: AC
  Administered 2020-10-13: 4 mg via INTRAVENOUS
  Filled 2020-10-13: qty 1

## 2020-10-13 MED ORDER — SODIUM CHLORIDE 0.9 % IV SOLN: 4.0000 mg | Freq: Once | INTRAVENOUS | Status: DC

## 2020-10-13 MED ORDER — CYANOCOBALAMIN 1000 MCG/ML IJ SOLN
1000.0000 ug | Freq: Once | INTRAMUSCULAR | Status: AC
  Administered 2020-10-13: 1000 ug via INTRAMUSCULAR
  Filled 2020-10-13: qty 1

## 2020-10-13 MED ORDER — GLIPIZIDE 5 MG PO TABS: 5.0000 mg | ORAL_TABLET | Freq: Every day | ORAL | 0 refills | Status: DC

## 2020-10-13 MED ORDER — SODIUM CHLORIDE 0.9 % IV SOLN
150.0000 mg | Freq: Once | INTRAVENOUS | Status: AC
  Administered 2020-10-13: 150 mg via INTRAVENOUS
  Filled 2020-10-13: qty 150

## 2020-10-13 MED ORDER — SODIUM CHLORIDE 0.9 % IV SOLN
200.0000 mg | Freq: Once | INTRAVENOUS | Status: AC
  Administered 2020-10-13: 200 mg via INTRAVENOUS
  Filled 2020-10-13: qty 8

## 2020-10-13 MED ORDER — SODIUM CHLORIDE 0.9 % IV SOLN
500.0000 mg/m2 | Freq: Once | INTRAVENOUS | Status: AC
  Administered 2020-10-13: 1000 mg via INTRAVENOUS
  Filled 2020-10-13: qty 40

## 2020-10-13 MED ORDER — SODIUM CHLORIDE 0.9 % IV SOLN
Freq: Once | INTRAVENOUS | Status: AC
  Filled 2020-10-13: qty 250

## 2020-10-13 MED ORDER — HEPARIN SOD (PORK) LOCK FLUSH 100 UNIT/ML IV SOLN
500.0000 [IU] | Freq: Once | INTRAVENOUS | Status: AC | PRN
  Administered 2020-10-13: 500 [IU]
  Filled 2020-10-13: qty 5

## 2020-10-13 MED ORDER — SODIUM CHLORIDE 0.9 % IV SOLN
458.5000 mg | Freq: Once | INTRAVENOUS | Status: AC
  Administered 2020-10-13: 460 mg via INTRAVENOUS
  Filled 2020-10-13: qty 46

## 2020-10-13 MED ORDER — HEPARIN SOD (PORK) LOCK FLUSH 100 UNIT/ML IV SOLN
INTRAVENOUS | Status: AC
  Filled 2020-10-13: qty 5

## 2020-10-13 MED ORDER — PALONOSETRON HCL INJECTION 0.25 MG/5ML
0.2500 mg | Freq: Once | INTRAVENOUS | Status: AC
  Administered 2020-10-13: 0.25 mg via INTRAVENOUS
  Filled 2020-10-13: qty 5

## 2020-10-13 NOTE — Progress Notes (Signed)
  Oncology Nurse Navigator Documentation  Navigator Location: [facility]-Med Onc (10/13/20 0900)   )Navigator Encounter Type: Follow-up Appt;Treatment (10/13/20 0900)                   Treatment Initiated Date: 09/08/20 (10/13/20 0900) Patient Visit Type: MedOnc (10/13/20 0900) Treatment Phase: First Chemo Tx (10/13/20 0900) Barriers/Navigation Needs: No Barriers At This Time;No Needs;No Questions (10/13/20 0900)   Interventions: None Required (10/13/20 0900)               met with patient and his wife during follow up visit with Dr. Rogue Bussing. Pt did not have any questions or needs at this time. Informed that will be given follow up appts during infusion. Instructed to call with any questions or needs. Pt and his wife verbalized understanding.       Time Spent with Patient: 30 (10/13/20 0900)

## 2020-10-13 NOTE — Patient Instructions (Signed)
#   START GLIPIZIDE as ordered # RE-START Metformin # Check Blood glucose twice a day; # DO NOT TAKE DEXAMETHASONE tomorrow.

## 2020-10-13 NOTE — Assessment & Plan Note (Addendum)
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 molecular testing negative for PDL 1/or any targetable mutation.  #Proceed with [place] today reviewed;  acceptable for treatment today except for elevated blood sugars of 358 [see below].   #Brain metastases- s/p dex ; s/p WBRT [finished FEB 18th, 2022]; OFF Keppra.  Plan brain MRI in the next 1 to 2 months.   #Poorly controlled diabetes-secondary steroids; recommend restart Metformin 500 mg twice daily; add glipizide 5 mg with breakfast.  Recommend not taking steroids post chemotherapy tomorrow.  Decrease the dose of steroids to 4 mg premedication.  Recommend checking blood sugars 2 times a day.  Call us if sugars elevated above 350.  #Bladder nodules-asymptomatic.  Monitor for now.  # DISPOSITION: # chemo today # follow up in 10 days- MD; labs- cbc/bmp # follow up in 3 weeks- MD; labs- carbo-alimat-keytruda-Dr.B

## 2020-10-13 NOTE — Progress Notes (Signed)
[facility] CONSULT NOTE  Patient Care Team: [place] Pink, MD as PCP - General (Family Medicine) Ottie Glazier, MD as Consulting Physician (Pulmonary Disease) Telford Nab, RN as Oncology Nurse Navigator  CHIEF COMPLAINTS/PURPOSE OF CONSULTATION: Metastatic lung cancer    Oncology History Overview Note  # JAN 17th, 2022-Headaches/Vomitting [Dr.Brasington; ophthalmology brain MRI]-multiple enhancing lesions the brain- bilateral cerebellar and cerebral hemispheres, concerning for metastatic disease. The largest lesions are seen in the left cerebellar hemisphere (22 mm), right temporal lobe (26 mm) and left occipital lobe (29 mm) with prominent surrounding vasogenic edema causing effacement of the adjacent cerebral sulci. 2. Flattening of the bilateral optic disc, right greater than left, likely related to increased intracranial pressure. Right mastoid effusion.  PET scan- JAN 25th, 2022-Hypermetabolic mediastinal lymph nodes;  No significant metabolic activity associated with small RIGHT lower lobe pulmonary nodule. This nodule remains indeterminate;  No primary malignancy identified on skull base to thigh PET-CT; Scan; Hypermetabolic brain metastasis noted; bladder nodules [on CT scan]-  DIAGNOSIS:  A. LYMPH NODE, STATION 4R (RIGHT LOWER PARATRACHEAL); EBUS FNA:  - POSITIVE FOR MALIGNANCY.  - METASTATIC ADENOCARCINOMA, TTF1 POSITIVE.   #Lung cancer-adenocarcinoma TTF-1 positive [mediastinal lymph node biopsy;Dr.Aleskerov];   # 2/18- [finsihed WBRT]  # 10/13/2020- carbo-alimta-keytruda  #Bladder nodules [chronic; asymptomatic-no cystoscopy]; right colon uptake on PET scan- Dr.Anna colonoscopy-Feb2022- NEG.   # PVD [Dr.Schneir s/p stenting]  # NGS/MOLECULAR TESTS:OMNISEQ- PDL-1  <1%; TBM-H; Common targets-NEG; UNCOMMON- QNS    # PALLIATIVE CARE EVALUATION:  # PAIN MANAGEMENT:    DIAGNOSIS: Lung cancer  STAGE:   IV      ;  GOALS:pallaitive  CURRENT/MOST  RECENT THERAPY :  WBRT     Brain metastasis ([place])  08/21/2020 Initial Diagnosis   Brain metastasis ([facility])   Primary cancer of right upper lobe of lung ([place])  09/06/2020 Initial Diagnosis   Primary cancer of right upper lobe of lung ([place])   09/06/2020 Cancer Staging   Staging form: Lung, AJCC 8th Edition - Clinical: Stage IVB (cT1, cN3, pM1c) - Signed by Cammie Sickle, MD on 09/06/2020   10/13/2020 -  Chemotherapy    Patient is on Treatment Plan: LUNG CARBOPLATIN / PEMETREXED / PEMBROLIZUMAB Q21D INDUCTION X 4 CYCLES / MAINTENANCE PEMETREXED + PEMBROLIZUMAB        HISTORY OF PRESENTING ILLNESS:  Alejandro Randolph 75 y.o.  male metastatic lung cancer to brain-is here for follow-up proceed with systemic chemotherapy.  In the interim patient had a port placed.  Patient has been taken off his Metformin/aspirin for the port placement he has not started back yet  Patient is currently status post radiation-approximately 3 weeks ago.  Denies any headaches.  Any nausea vomiting but appetite is good.   Review of Systems  Constitutional: Negative for chills, diaphoresis, fever, malaise/fatigue and weight loss.  HENT: Negative for nosebleeds and sore throat.   Eyes: Negative for double vision.  Respiratory: Negative for cough, hemoptysis and wheezing.   Cardiovascular: Negative for chest pain, palpitations, orthopnea and leg swelling.  Gastrointestinal: Negative for abdominal pain, blood in stool, constipation, diarrhea, heartburn, melena, nausea and vomiting.  Genitourinary: Negative for dysuria, frequency and urgency.  Musculoskeletal: Positive for back pain. Negative for joint pain.  Skin: Negative.  Negative for itching and rash.  Neurological: Negative for dizziness, tingling, focal weakness and weakness.  Endo/Heme/Allergies: Does not bruise/bleed easily.  Psychiatric/Behavioral: Negative for depression. The patient is not nervous/anxious and does not have insomnia.  MEDICAL  HISTORY:  Past Medical History:  Diagnosis Date  . Abscess of sigmoid colon   . Cancer [facility])    brain tumor  . Diabetes mellitus without complication ([place])   . GERD (gastroesophageal reflux disease)   . Hypercholesteremia   . Hypertension   . Peripheral vascular disease ([place])   . PUD (peptic ulcer disease)     SURGICAL HISTORY: Past Surgical History:  Procedure Laterality Date  . CHOLECYSTECTOMY    . COLONOSCOPY WITH PROPOFOL N/A 07/19/2016   Procedure: COLONOSCOPY WITH PROPOFOL;  Surgeon: Jonathon Bellows, MD;  Location: [facility] ORS;  Service: Endoscopy;  Laterality: N/A;  . COLONOSCOPY WITH PROPOFOL N/A 09/02/2016   Procedure: COLONOSCOPY WITH PROPOFOL;  Surgeon: Jonathon Bellows, MD;  Location: [facility] ENDOSCOPY;  Service: Endoscopy;  Laterality: N/A;  . COLONOSCOPY WITH PROPOFOL N/A 09/04/2020   Procedure: COLONOSCOPY WITH PROPOFOL;  Surgeon: Jonathon Bellows, MD;  Location: [facility] ENDOSCOPY;  Service: Gastroenterology;  Laterality: N/A;  . femoral stents Bilateral   . FLEXIBLE SIGMOIDOSCOPY  07/19/2016   Procedure: FLEXIBLE SIGMOIDOSCOPY;  Surgeon: Jonathon Bellows, MD;  Location: [facility] ORS;  Service: Endoscopy;;  . HERNIA REPAIR    . IR IMAGING GUIDED PORT INSERTION  10/10/2020  . RETINAL DETACHMENT SURGERY    . TONSILLECTOMY    . VIDEO BRONCHOSCOPY WITH ENDOBRONCHIAL ULTRASOUND N/A 09/01/2020   Procedure: VIDEO BRONCHOSCOPY WITH ENDOBRONCHIAL ULTRASOUND;  Surgeon: Ottie Glazier, MD;  Location: [facility] ORS;  Service: Thoracic;  Laterality: N/A;    SOCIAL HISTORY: Social History   Socioeconomic History  . Marital status: Married    Spouse name: Katharine Look  . Number of children: Not on file  . Years of education: Not on file  . Highest education level: Not on file  Occupational History    Comment: retired  Tobacco Use  . Smoking status: Former Smoker    Quit date: 2001    Years since quitting: 21.2  . Smokeless tobacco: Never Used  Vaping Use  . Vaping Use: Never used  Substance and Sexual Activity  .  Alcohol use: No  . Drug use: No  . Sexual activity: Not on file  Other Topics Concern  . Not on file  Social History Narrative   Lives in [place]; lives with wife; 1 son- lives close by.[id number]- Quit smoking- 2001. No Alcohol.    Feels safe in his home.   Social Determinants of Health   Financial Resource Strain: Not on file  Food Insecurity: Not on file  Transportation Needs: Not on file  Physical Activity: Not on file  Stress: Not on file  Social Connections: Not on file  Intimate Partner Violence: Not on file    FAMILY HISTORY: Family History  Problem Relation Age of Onset  . Diabetes Mother   . Hypertension Mother   . Diabetes Father     ALLERGIES:  is allergic to atorvastatin.  MEDICATIONS:  Current Outpatient Medications  Medication Sig Dispense Refill  . amLODipine (NORVASC) 10 MG tablet Take 10 mg by mouth daily.    Marland Kitchen dexamethasone (DECADRON) 4 MG tablet Take one pill AM & PM for 2 days. TAKE day prior & day AFTER chemo. Do NOT take on day of chemo. 60 tablet 0  . folic acid (FOLVITE) 1 MG tablet Take 1 tablet (1 mg total) by mouth daily. 90 tablet 1  . glipiZIDE (GLUCOTROL) 5 MG tablet Take 1 tablet (5 mg total) by mouth daily before breakfast. 30 tablet 0  . glucose blood (ONETOUCH ULTRA) test strip  USE AS DIRECTED TWICE A DAY TO CHECK BLOOD SUGAR    . Omega-3 Fatty Acids (OMEGA-3 FISH OIL) 500 MG CAPS Take 500 mg by mouth daily.    . ondansetron (ZOFRAN) 8 MG tablet One pill every 8 hours as needed for nausea/vomitting. 40 tablet 1  . pantoprazole (PROTONIX) 40 MG tablet Take 40 mg by mouth daily.    Marland Kitchen ROCKLATAN 0.02-0.005 % SOLN Place 1 drop into the left eye daily.    . rosuvastatin (CRESTOR) 5 MG tablet Take 5 mg by mouth at bedtime.    . Saw Palmetto, Serenoa repens, (SAW PALMETTO PO) Take 450 mg by mouth daily.    Marland Kitchen aspirin EC 81 MG tablet Take 81 mg by mouth daily. (Patient not taking: Reported on 10/13/2020)    . fluticasone (FLONASE) 50 MCG/ACT nasal spray  Place 1 spray into both nostrils daily as needed for allergies. (Patient not taking: Reported on 10/13/2020)    . lidocaine-prilocaine (EMLA) cream Apply 1 application topically as needed. Apply 30-45 mins prior to port access. (Patient not taking: Reported on 10/13/2020) 30 g 3  . metFORMIN (GLUCOPHAGE-XR) 500 MG 24 hr tablet Take 500 mg by mouth 2 (two) times daily. (Patient not taking: Reported on 10/13/2020)     No current facility-administered medications for this visit.   Facility-Administered Medications Ordered in Other Visits  Medication Dose Route Frequency Provider Last Rate Last Admin  . CARBOplatin (PARAPLATIN) 460 mg in sodium chloride 0.9 % 250 mL chemo infusion  460 mg Intravenous Once Charlaine Dalton R, MD      . fosaprepitant (EMEND) 150 mg in sodium chloride 0.9 % 145 mL IVPB  150 mg Intravenous Once Cammie Sickle, MD      . pembrolizumab [facility]) 200 mg in sodium chloride 0.9 % 50 mL chemo infusion  200 mg Intravenous Once Charlaine Dalton R, MD      . PEMEtrexed (ALIMTA) 1,000 mg in sodium chloride 0.9 % 100 mL chemo infusion  500 mg/m2 (Treatment Plan Recorded) Intravenous Once Cammie Sickle, MD          .  PHYSICAL EXAMINATION: ECOG PERFORMANCE STATUS: 1 - Symptomatic but completely ambulatory  Vitals:   10/13/20 0840  BP: (!) 134/57  Pulse: 73  Resp: 16  Temp: (!) 96.4 F (35.8 C)  SpO2: 97%   Filed Weights   10/13/20 0840  Weight: 196 lb (88.9 kg)    Physical Exam Constitutional:      Comments: Patient is walking independently.  Accompanied by his wife.  HENT:     Head: Normocephalic and atraumatic.     Mouth/Throat:     Pharynx: No oropharyngeal exudate.  Eyes:     Pupils: Pupils are equal, round, and reactive to light.  Cardiovascular:     Rate and Rhythm: Normal rate and regular rhythm.  Pulmonary:     Effort: No respiratory distress.     Breath sounds: No wheezing.     Comments: Decreased breath sounds bilaterally at  the bases.  No wheeze or crackles. Abdominal:     General: Bowel sounds are normal. There is no distension.     Palpations: Abdomen is soft. There is no mass.     Tenderness: There is no abdominal tenderness. There is no guarding or rebound.  Musculoskeletal:        General: No tenderness. Normal range of motion.     Cervical back: Normal range of motion and neck supple.  Skin:  General: Skin is warm.  Neurological:     Mental Status: He is alert and oriented to person, place, and time.  Psychiatric:        Mood and Affect: Affect normal.      LABORATORY DATA:  I have reviewed the data as listed Lab Results  Component Value Date   WBC 11.0 (H) 10/13/2020   HGB 13.6 10/13/2020   HCT 39.7 10/13/2020   MCV 93.4 10/13/2020   PLT 206 10/13/2020   Recent Labs    08/21/20 1120 09/05/20 0927 09/26/20 0917 10/13/20 0826  NA 137 136 140 136  K 3.8 4.1 4.3 3.9  CL 103 101 103 105  CO2 _0 21*  GLUCOSE 171* 161* 153* 353*  BUN 18 39* 19 21  CREATININE 1.41* 1.77* 1.14 1.24  CALCIUM 10.0 9.6 9.8 9.8  GFRNONAA 53* 40* >60 >60  PROT 7.2  --  6.7 6.2*  ALBUMIN 3.8  --  3.0* 3.0*  AST 29  --  32 17  ALT 30  --  58* 21  ALKPHOS 45  --  56 49  BILITOT 0.7  --  0.9 1.0    RADIOGRAPHIC STUDIES: I have personally reviewed the radiological images as listed and agreed with the findings in the report. IR IMAGING GUIDED PORT INSERTION  Result Date: 10/10/2020 INDICATION: Right chest port placement EXAM: IMPLANTED PORT A CATH PLACEMENT WITH ULTRASOUND AND FLUOROSCOPIC GUIDANCE MEDICATIONS: None ANESTHESIA/SEDATION: Moderate (conscious) sedation was employed during this procedure. A total of Versed 2 mg and Fentanyl 100 mcg was administered intravenously. Moderate Sedation Time: 15 minutes. The patient's level of consciousness and vital signs were monitored continuously by radiology nursing throughout the procedure under my direct supervision. FLUOROSCOPY TIME:  0.3 minutes (5.44  mGy) COMPLICATIONS: None immediate. PROCEDURE: The procedure, risks, benefits, and alternatives were explained to the patient. Questions regarding the procedure were encouraged and answered. The patient understands and consents to the procedure. A timeout was performed prior to the initiation of the procedure. Patient positioned supine on the angiography table. Right neck and anterior upper chest prepped and draped in the usual sterile fashion. All elements of maximal sterile barrier were utilized including, cap, mask, sterile gown, sterile gloves, large sterile drape, hand scrubbing and 2% Chlorhexidine for skin cleaning. The right internal jugular vein was evaluated with ultrasound and shown to be patent. A permanent ultrasound image was obtained and placed in the patient's medical record. Local anesthesia was provided with 1% lidocaine with epinephrine. Using sterile gel and a sterile probe cover, the right internal jugular vein was entered with a 21 ga needle during real time ultrasound guidance. 0.018 inch guidewire placed and 21 ga needle exchanged for transitional dilator set. Utilizing fluoroscopy, 0.035 inch guidewire advanced through the needle without difficulty. Attention then turned to the right anterior upper chest. Following local lidocaine administration, a port pocket was created. The catheter was connected to the port and brought from the pocket to the venotomy site through a subcutaneous tunnel. The catheter was cut to size and inserted through the peel-away sheath. The catheter tip was positioned at the cavoatrial junction using fluoroscopic guidance. The port aspirated and flushed well. The port pocket was closed with deep and superficial absorbable suture. The port pocket incision and venotomy sites were also sealed with Dermabond. IMPRESSION: Successful placement of a right internal jugular approach power injectable Port-A-Cath. The catheter is ready for immediate use. Electronically Signed    By: Danie Binder.D.  On: 10/10/2020 16:55    ASSESSMENT & PLAN:   Primary cancer of right upper lobe of lung ([place]) #Mediastinal for biopsy-positive for adenocarcinoma TTF-1 positive-suggestive of lung origin.  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 molecular testing negative for PDL 1/or any targetable mutation.  #Proceed with [place] today reviewed;  acceptable for treatment today except for elevated blood sugars of 358 [see below].   #Brain metastases- s/p dex ; s/p WBRT [finished FEB 18th, 2022]; OFF Keppra.  Plan brain MRI in the next 1 to 2 months.   #Poorly controlled diabetes-secondary steroids; recommend restart Metformin 500 mg twice daily; add glipizide 5 mg with breakfast.  Recommend not taking steroids post chemotherapy tomorrow.  Decrease the dose of steroids to 4 mg premedication.  Recommend checking blood sugars 2 times a day.  Call us if sugars elevated above 350.  #Bladder nodules-asymptomatic.  Monitor for now.  # DISPOSITION: # chemo today # follow up in 10 days- MD; labs- cbc/bmp # follow up in 3 weeks- MD; labs- carbo-alimat-keytruda-Dr.B    All questions were answered. The patient knows to call the clinic with any problems, questions or concerns.    Cammie Sickle, MD 10/13/2020 9:33 AM

## 2020-10-13 NOTE — Progress Notes (Signed)
Has not taken aspirin or metformin since Thursday. Friday he had port placed. Should he start back tomorrow taking meds?

## 2020-10-14 ENCOUNTER — Telehealth: Payer: Self-pay

## 2020-10-14 NOTE — Telephone Encounter (Signed)
Telephone call to patient for follow up after receiving first infusion.   Patient states infusion went great.  States eating good and drinking plenty of fluids.   Denies any nausea or vomiting.  Encouraged patient to call for any concerns or questions. 

## 2020-10-17 ENCOUNTER — Other Ambulatory Visit: Payer: Self-pay

## 2020-10-17 DIAGNOSIS — C3411 Malignant neoplasm of upper lobe, right bronchus or lung: Secondary | ICD-10-CM

## 2020-10-17 DIAGNOSIS — C7931 Secondary malignant neoplasm of brain: Secondary | ICD-10-CM

## 2020-10-17 DIAGNOSIS — R911 Solitary pulmonary nodule: Secondary | ICD-10-CM

## 2020-10-23 ENCOUNTER — Inpatient Hospital Stay: Payer: Medicare PPO | Admitting: Internal Medicine

## 2020-10-23 ENCOUNTER — Inpatient Hospital Stay: Payer: Medicare PPO

## 2020-10-23 ENCOUNTER — Ambulatory Visit
Admission: RE | Admit: 2020-10-23 | Discharge: 2020-10-23 | Disposition: A | Discharge status: Home / Self Care | Payer: Medicare PPO | Source: Ambulatory Visit | Attending: Radiation Oncology | Admitting: Radiation Oncology

## 2020-10-23 DIAGNOSIS — C3411 Malignant neoplasm of upper lobe, right bronchus or lung: Secondary | ICD-10-CM

## 2020-10-23 DIAGNOSIS — C7931 Secondary malignant neoplasm of brain: Secondary | ICD-10-CM | POA: Insufficient documentation

## 2020-10-23 DIAGNOSIS — C801 Malignant (primary) neoplasm, unspecified: Secondary | ICD-10-CM | POA: Insufficient documentation

## 2020-10-23 DIAGNOSIS — Z923 Personal history of irradiation: Secondary | ICD-10-CM | POA: Diagnosis not present

## 2020-10-23 DIAGNOSIS — R911 Solitary pulmonary nodule: Secondary | ICD-10-CM

## 2020-10-23 DIAGNOSIS — Z5111 Encounter for antineoplastic chemotherapy: Secondary | ICD-10-CM | POA: Diagnosis not present

## 2020-10-23 LAB — CBC WITH DIFFERENTIAL/PLATELET
Abs Immature Granulocytes: 0.02 10*3/uL (ref 0.00–0.07)
Basophils Absolute: 0 10*3/uL (ref 0.0–0.1)
Basophils Relative: 0 %
Eosinophils Absolute: 0.2 10*3/uL (ref 0.0–0.5)
Eosinophils Relative: 8 %
HCT: 38.2 % — ABNORMAL LOW (ref 39.0–52.0)
Hemoglobin: 12.9 g/dL — ABNORMAL LOW (ref 13.0–17.0)
Immature Granulocytes: 1 %
Lymphocytes Relative: 28 %
Lymphs Abs: 0.6 10*3/uL — ABNORMAL LOW (ref 0.7–4.0)
MCH: 31.4 pg (ref 26.0–34.0)
MCHC: 33.8 g/dL (ref 30.0–36.0)
MCV: 92.9 fL (ref 80.0–100.0)
Monocytes Absolute: 0.4 10*3/uL (ref 0.1–1.0)
Monocytes Relative: 17 %
Neutro Abs: 1 10*3/uL — ABNORMAL LOW (ref 1.7–7.7)
Neutrophils Relative %: 46 %
Platelets: 118 10*3/uL — ABNORMAL LOW (ref 150–400)
RBC: 4.11 MIL/uL — ABNORMAL LOW (ref 4.22–5.81)
RDW: 13.9 % (ref 11.5–15.5)
WBC: 2.2 10*3/uL — ABNORMAL LOW (ref 4.0–10.5)
nRBC: 0 % (ref 0.0–0.2)

## 2020-10-23 LAB — COMPREHENSIVE METABOLIC PANEL
ALT: 33 U/L (ref 0–44)
AST: 24 U/L (ref 15–41)
Albumin: 3.2 g/dL — ABNORMAL LOW (ref 3.5–5.0)
Alkaline Phosphatase: 45 U/L (ref 38–126)
Anion gap: 10 (ref 5–15)
BUN: 15 mg/dL (ref 8–23)
CO2: 25 mmol/L (ref 22–32)
Calcium: 9.4 mg/dL (ref 8.9–10.3)
Chloride: 104 mmol/L (ref 98–111)
Creatinine, Ser: 0.95 mg/dL (ref 0.61–1.24)
GFR, Estimated: >60 mL/min (ref >60)
Glucose, Bld: 144 mg/dL — ABNORMAL HIGH (ref 70–99)
Potassium: 3.5 mmol/L (ref 3.5–5.1)
Sodium: 139 mmol/L (ref 135–145)
Total Bilirubin: 0.9 mg/dL (ref 0.3–1.2)
Total Protein: 6.1 g/dL — ABNORMAL LOW (ref 6.5–8.1)

## 2020-10-23 NOTE — Assessment & Plan Note (Addendum)
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   # s/p carbo Alimta Keytruda-#1 appx 10 days ago.  Plan proceed #2 on 04/04.  ANC 1.1; hemoglobin 12; platelets slightly low-status post chemotherapy.  Reviewed with the patient/wife  # Brain metastases- s/p dex ; s/p WBRT [finished FEB 18th, 2022]; OFF Keppra.  Plan brain MRI in April 2022.  Recommend patient hold off driving at this time.  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  Today blood sugars 133-stable.   # DISPOSITION: # follow up as planned- MD; labs- carbo-alimat-keytruda-Dr.B

## 2020-10-23 NOTE — Progress Notes (Signed)
Pt in clinic this am. Dyspnea occ which is mild with exertion. Appetite is pretty good. Occ constipation. Instructed pt on how to take miralax.Pt has a couple of round spots on abd that started last week. Getting much lighter now.

## 2020-10-23 NOTE — Progress Notes (Signed)
[facility] CONSULT NOTE  Patient Care Team: [place] Pink, MD as PCP - General (Family Medicine) Ottie Glazier, MD as Consulting Physician (Pulmonary Disease) Telford Nab, RN as Oncology Nurse Navigator  CHIEF COMPLAINTS/PURPOSE OF CONSULTATION: Metastatic lung cancer    Oncology History Overview Note  # JAN 17th, 2022-Headaches/Vomitting [Dr.Brasington; ophthalmology brain MRI]-multiple enhancing lesions the brain- bilateral cerebellar and cerebral hemispheres, concerning for metastatic disease. The largest lesions are seen in the left cerebellar hemisphere (22 mm), right temporal lobe (26 mm) and left occipital lobe (29 mm) with prominent surrounding vasogenic edema causing effacement of the adjacent cerebral sulci. 2. Flattening of the bilateral optic disc, right greater than left, likely related to increased intracranial pressure. Right mastoid effusion.  PET scan- JAN 25th, 2022-Hypermetabolic mediastinal lymph nodes;  No significant metabolic activity associated with small RIGHT lower lobe pulmonary nodule. This nodule remains indeterminate;  No primary malignancy identified on skull base to thigh PET-CT; Scan; Hypermetabolic brain metastasis noted; bladder nodules [on CT scan]-  DIAGNOSIS:  A. LYMPH NODE, STATION 4R (RIGHT LOWER PARATRACHEAL); EBUS FNA:  - POSITIVE FOR MALIGNANCY.  - METASTATIC ADENOCARCINOMA, TTF1 POSITIVE.   #Lung cancer-adenocarcinoma TTF-1 positive [mediastinal lymph node biopsy;Dr.Aleskerov];   # 2/18- [finsihed WBRT]  # 10/13/2020- carbo-alimta-keytruda  #Bladder nodules [chronic; asymptomatic-no cystoscopy]; right colon uptake on PET scan- Dr.Anna colonoscopy-Feb2022- NEG.   # PVD [Dr.Schneir s/p stenting]  # NGS/MOLECULAR TESTS:OMNISEQ- PDL-1  <1%; TBM-H; Common targets-NEG; UNCOMMON- QNS    # PALLIATIVE CARE EVALUATION:  # PAIN MANAGEMENT:    DIAGNOSIS: Lung cancer  STAGE:   IV      ;  GOALS:pallaitive  CURRENT/MOST  RECENT THERAPY :  WBRT     Brain metastasis ([place])  08/21/2020 Initial Diagnosis   Brain metastasis (H[facility])   Primary cancer of right upper lobe of lung ([place])  09/06/2020 Initial Diagnosis   Primary cancer of right upper lobe of lung ([place])   09/06/2020 Cancer Staging   Staging form: Lung, AJCC 8th Edition - Clinical: Stage IVB (cT1, cN3, pM1c) - Signed by Cammie Sickle, MD on 09/06/2020   10/13/2020 -  Chemotherapy    Patient is on Treatment Plan: LUNG CARBOPLATIN / PEMETREXED / PEMBROLIZUMAB Q21D INDUCTION X 4 CYCLES / MAINTENANCE PEMETREXED + PEMBROLIZUMAB        HISTORY OF PRESENTING ILLNESS:  Alejandro Randolph 75 y.o.  male metastatic lung cancer to brain currently s/p [place] Alimta Keytruda cycle #1 is here for follow-up.  Patient states his blood sugars are still slightly elevated ~200 range in the morning.  Is currently on Metformin/glipizide.  No nausea no vomiting.  No diarrhea.  Noted to have a rash on his abdomen.  Neurology.   Review of Systems  Constitutional: Negative for chills, diaphoresis, fever, malaise/fatigue and weight loss.  HENT: Negative for nosebleeds and sore throat.   Eyes: Negative for double vision.  Respiratory: Negative for cough, hemoptysis and wheezing.   Cardiovascular: Negative for chest pain, palpitations, orthopnea and leg swelling.  Gastrointestinal: Negative for abdominal pain, blood in stool, constipation, diarrhea, heartburn, melena, nausea and vomiting.  Genitourinary: Negative for dysuria, frequency and urgency.  Musculoskeletal: Positive for back pain. Negative for joint pain.  Skin: Positive for rash. Negative for itching.  Neurological: Negative for dizziness, tingling, focal weakness and weakness.  Endo/Heme/Allergies: Does not bruise/bleed easily.  Psychiatric/Behavioral: Negative for depression. The patient is not nervous/anxious and does not have insomnia.      MEDICAL HISTORY:  Past Medical History:  Diagnosis Date  .  Abscess of sigmoid colon   . Cancer [facility])    brain tumor  . Diabetes mellitus without complication ([place])   . GERD (gastroesophageal reflux disease)   . Hypercholesteremia   . Hypertension   . Peripheral vascular disease ([place])   . PUD (peptic ulcer disease)     SURGICAL HISTORY: Past Surgical History:  Procedure Laterality Date  . CHOLECYSTECTOMY    . COLONOSCOPY WITH PROPOFOL N/A 07/19/2016   Procedure: COLONOSCOPY WITH PROPOFOL;  Surgeon: Jonathon Bellows, MD;  Location: [facility] ORS;  Service: Endoscopy;  Laterality: N/A;  . COLONOSCOPY WITH PROPOFOL N/A 09/02/2016   Procedure: COLONOSCOPY WITH PROPOFOL;  Surgeon: Jonathon Bellows, MD;  Location: [facility] ENDOSCOPY;  Service: Endoscopy;  Laterality: N/A;  . COLONOSCOPY WITH PROPOFOL N/A 09/04/2020   Procedure: COLONOSCOPY WITH PROPOFOL;  Surgeon: Jonathon Bellows, MD;  Location: [facility] ENDOSCOPY;  Service: Gastroenterology;  Laterality: N/A;  . femoral stents Bilateral   . FLEXIBLE SIGMOIDOSCOPY  07/19/2016   Procedure: FLEXIBLE SIGMOIDOSCOPY;  Surgeon: Jonathon Bellows, MD;  Location: [facility] ORS;  Service: Endoscopy;;  . HERNIA REPAIR    . IR IMAGING GUIDED PORT INSERTION  10/10/2020  . RETINAL DETACHMENT SURGERY    . TONSILLECTOMY    . VIDEO BRONCHOSCOPY WITH ENDOBRONCHIAL ULTRASOUND N/A 09/01/2020   Procedure: VIDEO BRONCHOSCOPY WITH ENDOBRONCHIAL ULTRASOUND;  Surgeon: Ottie Glazier, MD;  Location: [facility] ORS;  Service: Thoracic;  Laterality: N/A;    SOCIAL HISTORY: Social History   Socioeconomic History  . Marital status: Married    Spouse name: Katharine Look  . Number of children: Not on file  . Years of education: Not on file  . Highest education level: Not on file  Occupational History    Comment: retired  Tobacco Use  . Smoking status: Former Smoker    Quit date: 2001    Years since quitting: 21.2  . Smokeless tobacco: Never Used  Vaping Use  . Vaping Use: Never used  Substance and Sexual Activity  . Alcohol use: No  . Drug use: No  . Sexual activity:  Not on file  Other Topics Concern  . Not on file  Social History Narrative   Lives in [place]; lives with wife; 1 son- lives close by.[id number]- Quit smoking- 2001. No Alcohol.    Feels safe in his home.   Social Determinants of Health   Financial Resource Strain: Not on file  Food Insecurity: Not on file  Transportation Needs: Not on file  Physical Activity: Not on file  Stress: Not on file  Social Connections: Not on file  Intimate Partner Violence: Not on file    FAMILY HISTORY: Family History  Problem Relation Age of Onset  . Diabetes Mother   . Hypertension Mother   . Diabetes Father     ALLERGIES:  is allergic to atorvastatin.  MEDICATIONS:  Current Outpatient Medications  Medication Sig Dispense Refill  . amLODipine (NORVASC) 10 MG tablet Take 10 mg by mouth daily.    Marland Kitchen aspirin EC 81 MG tablet Take 81 mg by mouth daily.    . fluticasone (FLONASE) 50 MCG/ACT nasal spray Place 1 spray into both nostrils daily as needed for allergies.    . folic acid (FOLVITE) 1 MG tablet Take 1 tablet (1 mg total) by mouth daily. 90 tablet 1  . glipiZIDE (GLUCOTROL) 5 MG tablet Take 1 tablet (5 mg total) by mouth daily before breakfast. 30 tablet 0  . glucose blood (ONETOUCH ULTRA) test strip USE AS DIRECTED TWICE  A DAY TO CHECK BLOOD SUGAR    . lidocaine-prilocaine (EMLA) cream Apply 1 application topically as needed. Apply 30-45 mins prior to port access. 30 g 3  . metFORMIN (GLUCOPHAGE-XR) 500 MG 24 hr tablet Take 500 mg by mouth 2 (two) times daily.    . Omega-3 Fatty Acids (OMEGA-3 FISH OIL) 500 MG CAPS Take 500 mg by mouth daily.    . ondansetron (ZOFRAN) 8 MG tablet One pill every 8 hours as needed for nausea/vomitting. 40 tablet 1  . pantoprazole (PROTONIX) 40 MG tablet Take 40 mg by mouth daily.    Marland Kitchen ROCKLATAN 0.02-0.005 % SOLN Place 1 drop into the left eye daily.    . rosuvastatin (CRESTOR) 5 MG tablet Take 5 mg by mouth at bedtime.    . Saw Palmetto, Serenoa repens, (SAW  PALMETTO PO) Take 450 mg by mouth daily.     No current facility-administered medications for this visit.      Marland Kitchen  PHYSICAL EXAMINATION: ECOG PERFORMANCE STATUS: 1 - Symptomatic but completely ambulatory  Vitals:   10/23/20 0948  BP: 132/68  Pulse: 71  Temp: (!) 97.5 F (36.4 C)  SpO2: 98%   Filed Weights   10/23/20 0948  Weight: 195 lb 3.2 oz (88.5 kg)    Physical Exam Constitutional:      Comments: Patient is walking independently.  Accompanied by his wife.  HENT:     Head: Normocephalic and atraumatic.     Mouth/Throat:     Pharynx: No oropharyngeal exudate.  Eyes:     Pupils: Pupils are equal, round, and reactive to light.  Cardiovascular:     Rate and Rhythm: Normal rate and regular rhythm.  Pulmonary:     Effort: No respiratory distress.     Breath sounds: No wheezing.     Comments: Decreased breath sounds bilaterally at the bases.  No wheeze or crackles. Abdominal:     General: Bowel sounds are normal. There is no distension.     Palpations: Abdomen is soft. There is no mass.     Tenderness: There is no abdominal tenderness. There is no guarding or rebound.  Musculoskeletal:        General: No tenderness. Normal range of motion.     Cervical back: Normal range of motion and neck supple.  Skin:    General: Skin is warm.     Comments: Approximately 3 cm macular lesions noted on either side of the abdomen-with mild central clearing.  Neurological:     Mental Status: He is alert and oriented to person, place, and time.  Psychiatric:        Mood and Affect: Affect normal.      LABORATORY DATA:  I have reviewed the data as listed Lab Results  Component Value Date   WBC 2.2 (L) 10/23/2020   HGB 12.9 (L) 10/23/2020   HCT 38.2 (L) 10/23/2020   MCV 92.9 10/23/2020   PLT 118 (L) 10/23/2020   Recent Labs    09/26/20 0917 10/13/20 0826 10/23/20 0926  NA 140 136 139  K 4.3 3.9 3.5  CL 103 105 104  CO2 28 21* 25  GLUCOSE 153* 353* 144*  BUN 19 21 15    CREATININE 1.14 1.24 0.95  CALCIUM 9.8 9.8 9.4  GFRNONAA >60 >60 >60  PROT 6.7 6.2* 6.1*  ALBUMIN 3.0* 3.0* 3.2*  AST 32 17 24  ALT 58* 21 33  ALKPHOS 56 49 45  BILITOT 0.9 1.0 0.9    RADIOGRAPHIC  STUDIES: I have personally reviewed the radiological images as listed and agreed with the findings in the report. IR IMAGING GUIDED PORT INSERTION  Result Date: 10/10/2020 INDICATION: Right chest port placement EXAM: IMPLANTED PORT A CATH PLACEMENT WITH ULTRASOUND AND FLUOROSCOPIC GUIDANCE MEDICATIONS: None ANESTHESIA/SEDATION: Moderate (conscious) sedation was employed during this procedure. A total of Versed 2 mg and Fentanyl 100 mcg was administered intravenously. Moderate Sedation Time: 15 minutes. The patient's level of consciousness and vital signs were monitored continuously by radiology nursing throughout the procedure under my direct supervision. FLUOROSCOPY TIME:  0.3 minutes (7.65 mGy) COMPLICATIONS: None immediate. PROCEDURE: The procedure, risks, benefits, and alternatives were explained to the patient. Questions regarding the procedure were encouraged and answered. The patient understands and consents to the procedure. A timeout was performed prior to the initiation of the procedure. Patient positioned supine on the angiography table. Right neck and anterior upper chest prepped and draped in the usual sterile fashion. All elements of maximal sterile barrier were utilized including, cap, mask, sterile gown, sterile gloves, large sterile drape, hand scrubbing and 2% Chlorhexidine for skin cleaning. The right internal jugular vein was evaluated with ultrasound and shown to be patent. A permanent ultrasound image was obtained and placed in the patient's medical record. Local anesthesia was provided with 1% lidocaine with epinephrine. Using sterile gel and a sterile probe cover, the right internal jugular vein was entered with a 21 ga needle during real time ultrasound guidance. 0.018 inch  guidewire placed and 21 ga needle exchanged for transitional dilator set. Utilizing fluoroscopy, 0.035 inch guidewire advanced through the needle without difficulty. Attention then turned to the right anterior upper chest. Following local lidocaine administration, a port pocket was created. The catheter was connected to the port and brought from the pocket to the venotomy site through a subcutaneous tunnel. The catheter was cut to size and inserted through the peel-away sheath. The catheter tip was positioned at the cavoatrial junction using fluoroscopic guidance. The port aspirated and flushed well. The port pocket was closed with deep and superficial absorbable suture. The port pocket incision and venotomy sites were also sealed with Dermabond. IMPRESSION: Successful placement of a right internal jugular approach power injectable Port-A-Cath. The catheter is ready for immediate use. Electronically Signed   By: Miachel Roux M.D.   On: 10/10/2020 16:55    ASSESSMENT & PLAN:   Primary cancer of right upper lobe of lung ([place]) #Mediastinal for biopsy-positive for adenocarcinoma TTF-1 positive-suggestive of lung origin.   # s/p carbo Alimta Keytruda-#1 appx 10 days ago.  Plan proceed #2 on 04/04.  ANC 1.1; hemoglobin 12; platelets slightly low-status post chemotherapy.  Reviewed with the patient/wife  # Brain metastases- s/p dex ; s/p WBRT [finished FEB 18th, 2022]; OFF Keppra.  Plan brain MRI in April 2022.  Recommend patient hold off driving at this time.  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  Today blood sugars 133-stable.   # DISPOSITION: # follow up as planned- MD; labs- carbo-alimat-keytruda-Dr.B    All questions were answered. The patient knows to call the clinic with any problems, questions or concerns.    Cammie Sickle, MD 10/23/2020  10:57 AM

## 2020-10-23 NOTE — Progress Notes (Signed)
Radiation Oncology Follow up Note  Name: Alejandro Randolph   Date:   10/23/2020 MRN:  [id number] DOB: Dec 18, 1945    This 76 y.o. 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  REFERRING PROVIDER: Maryland Pink, MD  HPI: Patient is a 75 year old male now out 1 month having pleated whole brain radiation therapy for multiple brain metastasis.  He is seen today in routine follow-up is doing well specifically tries denies any change in neurologic status he is off steroids.  He is currently undergoing [place] Alimta and Keytruda..  COMPLICATIONS OF TREATMENT: none  FOLLOW UP COMPLIANCE: keeps appointments   PHYSICAL EXAM:  There were no vitals taken for this visit. Crude visual fields within normal range proprioception is intact motor or sensory and DTR levels are equal and symmetric.  Well-developed well-nourished patient in NAD. HEENT reveals PERLA, EOMI, discs not visualized.  Oral cavity is clear. No oral mucosal lesions are identified. Neck is clear without evidence of cervical or supraclavicular adenopathy. Lungs are clear to A&P. Cardiac examination is essentially unremarkable with regular rate and rhythm without murmur rub or thrill. Abdomen is benign with no organomegaly or masses noted. Motor sensory and DTR levels are equal and symmetric in the upper and lower extremities. Cranial nerves II through XII are grossly intact. Proprioception is intact. No peripheral adenopathy or edema is identified. No motor or sensory levels are noted. Crude visual fields are within normal range.  RADIOLOGY RESULTS: No current films for review  PLAN: Present time patient is doing well recovering well from his whole brain radiation.  I am pleased with his overall progress.  I will turn follow-up care over to medical oncology I be happy to reevaluate the patient anytime for  further palliative treatment be indicated.  Patient and wife know to call with any concerns.  I would like to take this opportunity to thank you for allowing me to participate in the care of your patient.Alejandro Filbert, MD

## 2020-11-03 ENCOUNTER — Inpatient Hospital Stay: Payer: Medicare PPO

## 2020-11-03 ENCOUNTER — Inpatient Hospital Stay: Payer: Medicare PPO | Attending: Internal Medicine

## 2020-11-03 ENCOUNTER — Other Ambulatory Visit: Payer: Self-pay

## 2020-11-03 ENCOUNTER — Inpatient Hospital Stay: Payer: Medicare PPO | Admitting: Internal Medicine

## 2020-11-03 VITALS — BP 145/74 | HR 76 | Temp 97.6°F | Resp 20 | Ht 69.0 in | Wt 195.6 lb

## 2020-11-03 DIAGNOSIS — Z79899 Other long term (current) drug therapy: Secondary | ICD-10-CM | POA: Diagnosis not present

## 2020-11-03 DIAGNOSIS — C778 Secondary and unspecified malignant neoplasm of lymph nodes of multiple regions: Secondary | ICD-10-CM | POA: Diagnosis not present

## 2020-11-03 DIAGNOSIS — Z87891 Personal history of nicotine dependence: Secondary | ICD-10-CM | POA: Insufficient documentation

## 2020-11-03 DIAGNOSIS — E1165 Type 2 diabetes mellitus with hyperglycemia: Secondary | ICD-10-CM | POA: Insufficient documentation

## 2020-11-03 DIAGNOSIS — Z7982 Long term (current) use of aspirin: Secondary | ICD-10-CM | POA: Diagnosis not present

## 2020-11-03 DIAGNOSIS — C3411 Malignant neoplasm of upper lobe, right bronchus or lung: Secondary | ICD-10-CM

## 2020-11-03 DIAGNOSIS — Z923 Personal history of irradiation: Secondary | ICD-10-CM | POA: Insufficient documentation

## 2020-11-03 DIAGNOSIS — E1151 Type 2 diabetes mellitus with diabetic peripheral angiopathy without gangrene: Secondary | ICD-10-CM | POA: Insufficient documentation

## 2020-11-03 DIAGNOSIS — I10 Essential (primary) hypertension: Secondary | ICD-10-CM | POA: Insufficient documentation

## 2020-11-03 DIAGNOSIS — Z5111 Encounter for antineoplastic chemotherapy: Secondary | ICD-10-CM | POA: Diagnosis present

## 2020-11-03 DIAGNOSIS — C7931 Secondary malignant neoplasm of brain: Secondary | ICD-10-CM | POA: Insufficient documentation

## 2020-11-03 DIAGNOSIS — E7800 Pure hypercholesterolemia, unspecified: Secondary | ICD-10-CM | POA: Diagnosis not present

## 2020-11-03 DIAGNOSIS — Z7984 Long term (current) use of oral hypoglycemic drugs: Secondary | ICD-10-CM | POA: Diagnosis not present

## 2020-11-03 LAB — CBC WITH DIFFERENTIAL/PLATELET
Abs Immature Granulocytes: 0.29 10*3/uL — ABNORMAL HIGH (ref 0.00–0.07)
Basophils Absolute: 0.1 10*3/uL (ref 0.0–0.1)
Basophils Relative: 2 %
Eosinophils Absolute: 0.2 10*3/uL (ref 0.0–0.5)
Eosinophils Relative: 3 %
HCT: 34.8 % — ABNORMAL LOW (ref 39.0–52.0)
Hemoglobin: 11.7 g/dL — ABNORMAL LOW (ref 13.0–17.0)
Immature Granulocytes: 4 %
Lymphocytes Relative: 16 %
Lymphs Abs: 1.1 10*3/uL (ref 0.7–4.0)
MCH: 31.8 pg (ref 26.0–34.0)
MCHC: 33.6 g/dL (ref 30.0–36.0)
MCV: 94.6 fL (ref 80.0–100.0)
Monocytes Absolute: 1.1 10*3/uL — ABNORMAL HIGH (ref 0.1–1.0)
Monocytes Relative: 16 %
Neutro Abs: 4 10*3/uL (ref 1.7–7.7)
Neutrophils Relative %: 59 %
Platelets: 423 10*3/uL — ABNORMAL HIGH (ref 150–400)
RBC: 3.68 MIL/uL — ABNORMAL LOW (ref 4.22–5.81)
RDW: 15.4 % (ref 11.5–15.5)
WBC: 6.8 10*3/uL (ref 4.0–10.5)
nRBC: 0 % (ref 0.0–0.2)

## 2020-11-03 LAB — COMPREHENSIVE METABOLIC PANEL
ALT: 18 U/L (ref 0–44)
AST: 24 U/L (ref 15–41)
Albumin: 3.1 g/dL — ABNORMAL LOW (ref 3.5–5.0)
Alkaline Phosphatase: 43 U/L (ref 38–126)
Anion gap: 10 (ref 5–15)
BUN: 12 mg/dL (ref 8–23)
CO2: 24 mmol/L (ref 22–32)
Calcium: 9.3 mg/dL (ref 8.9–10.3)
Chloride: 105 mmol/L (ref 98–111)
Creatinine, Ser: 1.06 mg/dL (ref 0.61–1.24)
GFR, Estimated: >60 mL/min (ref >60)
Glucose, Bld: 243 mg/dL — ABNORMAL HIGH (ref 70–99)
Potassium: 3.3 mmol/L — ABNORMAL LOW (ref 3.5–5.1)
Sodium: 139 mmol/L (ref 135–145)
Total Bilirubin: 0.5 mg/dL (ref 0.3–1.2)
Total Protein: 6.1 g/dL — ABNORMAL LOW (ref 6.5–8.1)

## 2020-11-03 MED ORDER — SODIUM CHLORIDE 0.9 % IV SOLN
515.0000 mg | Freq: Once | INTRAVENOUS | Status: AC
  Administered 2020-11-03: 520 mg via INTRAVENOUS
  Filled 2020-11-03: qty 52

## 2020-11-03 MED ORDER — SODIUM CHLORIDE 0.9 % IV SOLN
500.0000 mg/m2 | Freq: Once | INTRAVENOUS | Status: AC
  Administered 2020-11-03: 1000 mg via INTRAVENOUS
  Filled 2020-11-03: qty 40

## 2020-11-03 MED ORDER — SODIUM CHLORIDE 0.9 % IV SOLN
200.0000 mg | Freq: Once | INTRAVENOUS | Status: AC
  Administered 2020-11-03: 200 mg via INTRAVENOUS
  Filled 2020-11-03: qty 8

## 2020-11-03 MED ORDER — SODIUM CHLORIDE 0.9 % IV SOLN
150.0000 mg | Freq: Once | INTRAVENOUS | Status: AC
  Administered 2020-11-03: 150 mg via INTRAVENOUS
  Filled 2020-11-03: qty 150

## 2020-11-03 MED ORDER — SODIUM CHLORIDE 0.9 % IV SOLN
Freq: Once | INTRAVENOUS | Status: AC
  Filled 2020-11-03: qty 250

## 2020-11-03 MED ORDER — HEPARIN SOD (PORK) LOCK FLUSH 100 UNIT/ML IV SOLN
500.0000 [IU] | Freq: Once | INTRAVENOUS | Status: AC | PRN
  Administered 2020-11-03: 500 [IU]
  Filled 2020-11-03: qty 5

## 2020-11-03 MED ORDER — DEXAMETHASONE SODIUM PHOSPHATE 10 MG/ML IJ SOLN
4.0000 mg | Freq: Once | INTRAMUSCULAR | Status: AC
  Administered 2020-11-03: 4 mg via INTRAVENOUS
  Filled 2020-11-03: qty 1

## 2020-11-03 MED ORDER — SODIUM CHLORIDE 0.9 % IV SOLN: 4.0000 mg | Freq: Once | INTRAVENOUS | Status: DC

## 2020-11-03 MED ORDER — PALONOSETRON HCL INJECTION 0.25 MG/5ML
0.2500 mg | Freq: Once | INTRAVENOUS | Status: AC
  Administered 2020-11-03: 0.25 mg via INTRAVENOUS
  Filled 2020-11-03: qty 5

## 2020-11-03 NOTE — Assessment & Plan Note (Addendum)
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 Currently on [place]  # proceed with carbo-alimta-keytruda #2 today. Labs today reviewed;  acceptable for treatment today.   # Brain metastases- s/p dex ; s/p WBRT [finished FEB 18th, 2022]; OFF Keppra.  Order MRI brain MRI today.   Recommend patient hold off driving at this time.  #Poorly controlled diabetes-secondary steroids; on Metformin 500 mg twice daily;glipizide 5 mg with breakfast.  Today blood sugars 248-monitir for now.   # DISPOSITION: # carbo-alimta-keytruda today # follow up as planned in 3 weeks- MD; labs- carbo-alimat-keytruda; MRI brain prior--Dr.B

## 2020-11-03 NOTE — Progress Notes (Signed)
[facility] CONSULT NOTE  Patient Care Team: [place] Pink, MD as PCP - General (Family Medicine) Ottie Glazier, MD as Consulting Physician (Pulmonary Disease) Telford Nab, RN as Oncology Nurse Navigator  CHIEF COMPLAINTS/PURPOSE OF CONSULTATION: Metastatic lung cancer    Oncology History Overview Note  # JAN 17th, 2022-Headaches/Vomitting [Dr.Brasington; ophthalmology brain MRI]-multiple enhancing lesions the brain- bilateral cerebellar and cerebral hemispheres, concerning for metastatic disease. The largest lesions are seen in the left cerebellar hemisphere (22 mm), right temporal lobe (26 mm) and left occipital lobe (29 mm) with prominent surrounding vasogenic edema causing effacement of the adjacent cerebral sulci. 2. Flattening of the bilateral optic disc, right greater than left, likely related to increased intracranial pressure. Right mastoid effusion.  PET scan- JAN 25th, 2022-Hypermetabolic mediastinal lymph nodes;  No significant metabolic activity associated with small RIGHT lower lobe pulmonary nodule. This nodule remains indeterminate;  No primary malignancy identified on skull base to thigh PET-CT; Scan; Hypermetabolic brain metastasis noted; bladder nodules [on CT scan]-  DIAGNOSIS:  A. LYMPH NODE, STATION 4R (RIGHT LOWER PARATRACHEAL); EBUS FNA:  - POSITIVE FOR MALIGNANCY.  - METASTATIC ADENOCARCINOMA, TTF1 POSITIVE.   #Lung cancer-adenocarcinoma TTF-1 positive [mediastinal lymph node biopsy;Dr.Aleskerov];   # 2/18- [finsihed WBRT]  # 10/13/2020- carbo-alimta-keytruda  #Bladder nodules [chronic; asymptomatic-no cystoscopy]; right colon uptake on PET scan- Dr.Anna colonoscopy-Feb2022- NEG.   # PVD [Dr.Schneir s/p stenting]  # NGS/MOLECULAR TESTS:OMNISEQ- PDL-1  <1%; TBM-H; Common targets-NEG; UNCOMMON- QNS    # PALLIATIVE CARE EVALUATION:  # PAIN MANAGEMENT:    DIAGNOSIS: Lung cancer  STAGE:   IV      ;  GOALS:pallaitive  CURRENT/MOST  RECENT THERAPY :  WBRT     Brain metastasis ([place])  08/21/2020 Initial Diagnosis   Brain metastasis (HCC)   Primary cancer of right upper lobe of lung ([place])  09/06/2020 Initial Diagnosis   Primary cancer of right upper lobe of lung ([place])   09/06/2020 Cancer Staging   Staging form: Lung, AJCC 8th Edition - Clinical: Stage IVB (cT1, cN3, pM1c) - Signed by Cammie Sickle, MD on 09/06/2020   10/13/2020 -  Chemotherapy    Patient is on Treatment Plan: LUNG CARBOPLATIN / PEMETREXED / PEMBROLIZUMAB Q21D INDUCTION X 4 CYCLES / MAINTENANCE PEMETREXED + PEMBROLIZUMAB        HISTORY OF PRESENTING ILLNESS:  Alejandro Randolph 75 y.o.  male metastatic lung cancer to brain currently s/p [place] Alimta [name]  is here for follow-up.  Patient denies any nausea vomiting abdominal pain.  Patient states that blood sugars are better controlled on Metformin/glipizide.  No diarrhea.  No rash.   Review of Systems  Constitutional: Negative for chills, diaphoresis, fever, malaise/fatigue and weight loss.  HENT: Negative for nosebleeds and sore throat.   Eyes: Negative for double vision.  Respiratory: Negative for cough, hemoptysis and wheezing.   Cardiovascular: Negative for chest pain, palpitations, orthopnea and leg swelling.  Gastrointestinal: Negative for abdominal pain, blood in stool, constipation, diarrhea, heartburn, melena, nausea and vomiting.  Genitourinary: Negative for dysuria, frequency and urgency.  Musculoskeletal: Positive for back pain. Negative for joint pain.  Skin: Positive for rash. Negative for itching.  Neurological: Negative for dizziness, tingling, focal weakness and weakness.  Endo/Heme/Allergies: Does not bruise/bleed easily.  Psychiatric/Behavioral: Negative for depression. The patient is not nervous/anxious and does not have insomnia.      MEDICAL HISTORY:  Past Medical History:  Diagnosis Date  . Abscess of sigmoid colon   . Cancer ([place])  brain tumor  . Diabetes  mellitus without complication ([place])   . GERD (gastroesophageal reflux disease)   . Hypercholesteremia   . Hypertension   . Peripheral vascular disease ([place])   . PUD (peptic ulcer disease)     SURGICAL HISTORY: Past Surgical History:  Procedure Laterality Date  . CHOLECYSTECTOMY    . COLONOSCOPY WITH PROPOFOL N/A 07/19/2016   Procedure: COLONOSCOPY WITH PROPOFOL;  Surgeon: Jonathon Bellows, MD;  Location: [facility] ORS;  Service: Endoscopy;  Laterality: N/A;  . COLONOSCOPY WITH PROPOFOL N/A 09/02/2016   Procedure: COLONOSCOPY WITH PROPOFOL;  Surgeon: Jonathon Bellows, MD;  Location: [facility] ENDOSCOPY;  Service: Endoscopy;  Laterality: N/A;  . COLONOSCOPY WITH PROPOFOL N/A 09/04/2020   Procedure: COLONOSCOPY WITH PROPOFOL;  Surgeon: Jonathon Bellows, MD;  Location: [facility] ENDOSCOPY;  Service: Gastroenterology;  Laterality: N/A;  . femoral stents Bilateral   . FLEXIBLE SIGMOIDOSCOPY  07/19/2016   Procedure: FLEXIBLE SIGMOIDOSCOPY;  Surgeon: Jonathon Bellows, MD;  Location: [facility] ORS;  Service: Endoscopy;;  . HERNIA REPAIR    . IR IMAGING GUIDED PORT INSERTION  10/10/2020  . RETINAL DETACHMENT SURGERY    . TONSILLECTOMY    . VIDEO BRONCHOSCOPY WITH ENDOBRONCHIAL ULTRASOUND N/A 09/01/2020   Procedure: VIDEO BRONCHOSCOPY WITH ENDOBRONCHIAL ULTRASOUND;  Surgeon: Ottie Glazier, MD;  Location: [facility] ORS;  Service: Thoracic;  Laterality: N/A;    SOCIAL HISTORY: Social History   Socioeconomic History  . Marital status: Married    Spouse name: Katharine Look  . Number of children: Not on file  . Years of education: Not on file  . Highest education level: Not on file  Occupational History    Comment: retired  Tobacco Use  . Smoking status: Former Smoker    Quit date: 2001    Years since quitting: 21.2  . Smokeless tobacco: Never Used  Vaping Use  . Vaping Use: Never used  Substance and Sexual Activity  . Alcohol use: No  . Drug use: No  . Sexual activity: Not on file  Other Topics Concern  . Not on file  Social History  Narrative   Lives in [place]; lives with wife; 1 son- lives close by.[id number]- Quit smoking- 2001. No Alcohol.    Feels safe in his home.   Social Determinants of Health   Financial Resource Strain: Not on file  Food Insecurity: Not on file  Transportation Needs: Not on file  Physical Activity: Not on file  Stress: Not on file  Social Connections: Not on file  Intimate Partner Violence: Not on file    FAMILY HISTORY: Family History  Problem Relation Age of Onset  . Diabetes Mother   . Hypertension Mother   . Diabetes Father     ALLERGIES:  is allergic to atorvastatin.  MEDICATIONS:  Current Outpatient Medications  Medication Sig Dispense Refill  . amLODipine (NORVASC) 10 MG tablet Take 10 mg by mouth daily.    Marland Kitchen aspirin EC 81 MG tablet Take 81 mg by mouth daily.    . fluticasone (FLONASE) 50 MCG/ACT nasal spray Place 1 spray into both nostrils daily as needed for allergies.    . folic acid (FOLVITE) 1 MG tablet Take 1 tablet (1 mg total) by mouth daily. 90 tablet 1  . glipiZIDE (GLUCOTROL) 5 MG tablet Take 1 tablet (5 mg total) by mouth daily before breakfast. 30 tablet 0  . glucose blood (ONETOUCH ULTRA) test strip USE AS DIRECTED TWICE A DAY TO CHECK BLOOD SUGAR    . lidocaine-prilocaine (EMLA) cream Apply 1 application  topically as needed. Apply 30-45 mins prior to port access. 30 g 3  . metFORMIN (GLUCOPHAGE-XR) 500 MG 24 hr tablet Take 500 mg by mouth 2 (two) times daily.    . Omega-3 Fatty Acids (OMEGA-3 FISH OIL) 500 MG CAPS Take 500 mg by mouth daily.    . ondansetron (ZOFRAN) 8 MG tablet One pill every 8 hours as needed for nausea/vomitting. 40 tablet 1  . pantoprazole (PROTONIX) 40 MG tablet Take 40 mg by mouth daily.    Marland Kitchen ROCKLATAN 0.02-0.005 % SOLN Place 1 drop into the left eye daily.    . rosuvastatin (CRESTOR) 5 MG tablet Take 5 mg by mouth at bedtime.    . Saw Palmetto, Serenoa repens, (SAW PALMETTO PO) Take 450 mg by mouth daily.     No current  facility-administered medications for this visit.      Marland Kitchen  PHYSICAL EXAMINATION: ECOG PERFORMANCE STATUS: 1 - Symptomatic but completely ambulatory  Vitals:   11/03/20 0834  BP: (!) 145/74  Pulse: 76  Resp: 20  Temp: 97.6 F (36.4 C)  SpO2: 97%   Filed Weights   11/03/20 0834  Weight: 195 lb 9.6 oz (88.7 kg)    Physical Exam Constitutional:      Comments: Patient is walking independently.  Accompanied by his wife.  HENT:     Head: Normocephalic and atraumatic.     Mouth/Throat:     Pharynx: No oropharyngeal exudate.  Eyes:     Pupils: Pupils are equal, round, and reactive to light.  Cardiovascular:     Rate and Rhythm: Normal rate and regular rhythm.  Pulmonary:     Effort: No respiratory distress.     Breath sounds: No wheezing.     Comments: Decreased breath sounds bilaterally at the bases.  No wheeze or crackles. Abdominal:     General: Bowel sounds are normal. There is no distension.     Palpations: Abdomen is soft. There is no mass.     Tenderness: There is no abdominal tenderness. There is no guarding or rebound.  Musculoskeletal:        General: No tenderness. Normal range of motion.     Cervical back: Normal range of motion and neck supple.  Skin:    General: Skin is warm.     Comments: Approximately 3 cm macular lesions noted on either side of the abdomen-with mild central clearing.  Neurological:     Mental Status: He is alert and oriented to person, place, and time.  Psychiatric:        Mood and Affect: Affect normal.      LABORATORY DATA:  I have reviewed the data as listed Lab Results  Component Value Date   WBC 6.8 11/03/2020   HGB 11.7 (L) 11/03/2020   HCT 34.8 (L) 11/03/2020   MCV 94.6 11/03/2020   PLT 423 (H) 11/03/2020   Recent Labs    10/13/20 0826 10/23/20 0926 11/03/20 0817  NA 136 139 139  K 3.9 3.5 3.3*  CL 105 104 105  CO2 21* 25 24  GLUCOSE 353* 144* 243*  BUN 21 15 12   CREATININE 1.24 0.95 1.06  CALCIUM 9.8 9.4 9.3   GFRNONAA >60 >60 >60  PROT 6.2* 6.1* 6.1*  ALBUMIN 3.0* 3.2* 3.1*  AST 17 24 24   ALT 21 33 18  ALKPHOS 49 45 43  BILITOT 1.0 0.9 0.5    RADIOGRAPHIC STUDIES: I have personally reviewed the radiological images as listed and agreed with the  findings in the report. IR IMAGING GUIDED PORT INSERTION  Result Date: 10/10/2020 INDICATION: Right chest port placement EXAM: IMPLANTED PORT A CATH PLACEMENT WITH ULTRASOUND AND FLUOROSCOPIC GUIDANCE MEDICATIONS: None ANESTHESIA/SEDATION: Moderate (conscious) sedation was employed during this procedure. A total of Versed 2 mg and Fentanyl 100 mcg was administered intravenously. Moderate Sedation Time: 15 minutes. The patient's level of consciousness and vital signs were monitored continuously by radiology nursing throughout the procedure under my direct supervision. FLUOROSCOPY TIME:  0.3 minutes (7.67 mGy) COMPLICATIONS: None immediate. PROCEDURE: The procedure, risks, benefits, and alternatives were explained to the patient. Questions regarding the procedure were encouraged and answered. The patient understands and consents to the procedure. A timeout was performed prior to the initiation of the procedure. Patient positioned supine on the angiography table. Right neck and anterior upper chest prepped and draped in the usual sterile fashion. All elements of maximal sterile barrier were utilized including, cap, mask, sterile gown, sterile gloves, large sterile drape, hand scrubbing and 2% Chlorhexidine for skin cleaning. The right internal jugular vein was evaluated with ultrasound and shown to be patent. A permanent ultrasound image was obtained and placed in the patient's medical record. Local anesthesia was provided with 1% lidocaine with epinephrine. Using sterile gel and a sterile probe cover, the right internal jugular vein was entered with a 21 ga needle during real time ultrasound guidance. 0.018 inch guidewire placed and 21 ga needle exchanged for  transitional dilator set. Utilizing fluoroscopy, 0.035 inch guidewire advanced through the needle without difficulty. Attention then turned to the right anterior upper chest. Following local lidocaine administration, a port pocket was created. The catheter was connected to the port and brought from the pocket to the venotomy site through a subcutaneous tunnel. The catheter was cut to size and inserted through the peel-away sheath. The catheter tip was positioned at the cavoatrial junction using fluoroscopic guidance. The port aspirated and flushed well. The port pocket was closed with deep and superficial absorbable suture. The port pocket incision and venotomy sites were also sealed with Dermabond. IMPRESSION: Successful placement of a right internal jugular approach power injectable Port-A-Cath. The catheter is ready for immediate use. Electronically Signed   By: Miachel Roux M.D.   On: 10/10/2020 16:55    ASSESSMENT & PLAN:   Primary cancer of right upper lobe of lung ([place]) #Mediastinal for biopsy-positive for adenocarcinoma TTF-1 positive-suggestive of lung origin. Currently on [place]  # proceed with carbo-alimta-keytruda #2 today. Labs today reviewed;  acceptable for treatment today.   # Brain metastases- s/p dex ; s/p WBRT [finished FEB 18th, 2022]; OFF Keppra.  Order MRI brain MRI today.   Recommend patient hold off driving at this time.  #Poorly controlled diabetes-secondary steroids; on Metformin 500 mg twice daily;glipizide 5 mg with breakfast.  Today blood sugars 248-monitir for now.   # DISPOSITION: # carbo-alimta-keytruda today # follow up as planned in 3 weeks- MD; labs- carbo-alimat-keytruda; MRI brain prior--Dr.B    All questions were answered. The patient knows to call the clinic with any problems, questions or concerns.    Cammie Sickle, MD 11/03/2020 3:29 PM

## 2020-11-11 ENCOUNTER — Other Ambulatory Visit: Payer: Self-pay | Admitting: Internal Medicine

## 2020-11-12 ENCOUNTER — Encounter: Payer: Self-pay | Admitting: *Deleted

## 2020-11-12 NOTE — Progress Notes (Signed)
  Oncology Nurse Navigator Documentation  Navigator Location: [facility] (11/12/20 1100)   )Navigator Encounter Type: Appt/Treatment Plan Review (11/12/20 1100)                         Barriers/Navigation Needs: No Barriers At This Time (11/12/20 1100)   Interventions: None Required (11/12/20 1100)

## 2020-11-20 ENCOUNTER — Other Ambulatory Visit: Payer: Self-pay

## 2020-11-20 ENCOUNTER — Ambulatory Visit
Admission: RE | Admit: 2020-11-20 | Discharge: 2020-11-20 | Disposition: A | Discharge status: Home / Self Care | Payer: Medicare PPO | Source: Ambulatory Visit | Attending: Internal Medicine | Admitting: Internal Medicine

## 2020-11-20 DIAGNOSIS — C3411 Malignant neoplasm of upper lobe, right bronchus or lung: Secondary | ICD-10-CM | POA: Diagnosis not present

## 2020-11-20 DIAGNOSIS — C7931 Secondary malignant neoplasm of brain: Secondary | ICD-10-CM | POA: Diagnosis present

## 2020-11-20 IMAGING — MR MR HEAD WO/W CM
14 series · 48 of 48 positions shown · IV contrast (gadavist)
Comparison: [DATE]

CLINICAL DATA: Metastatic lung cancer post whole brain radiation

EXAM:
MRI HEAD WITHOUT AND WITH CONTRAST
TECHNIQUE: Multiplanar, multiecho pulse sequences of the brain and surrounding
structures were obtained without and with intravenous contrast.
CONTRAST:  9mL GADAVIST GADOBUTROL 1 MMOL/ML IV SOLN

[Series 5: ax dwi_tracew · axial · 3.0mm · 0.65mm/px · z∈[-42,+106]mm · 4 of 48 slices shown]
[im 1/48]
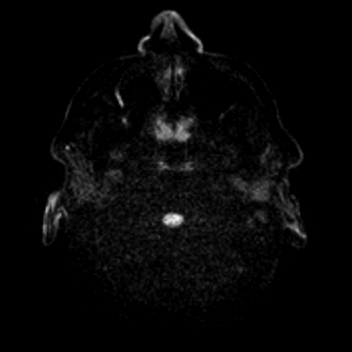
[im 16/48]
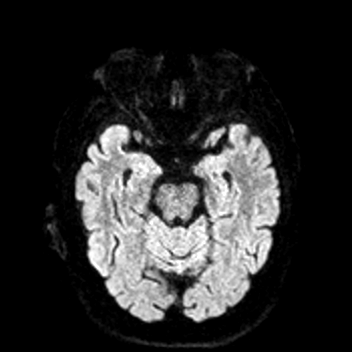
[im 32/48]
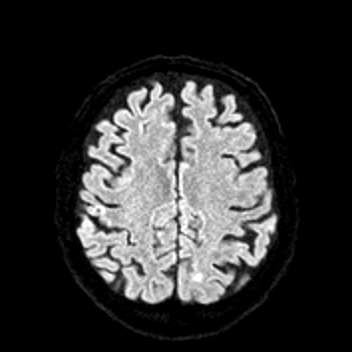
[im 48/48]
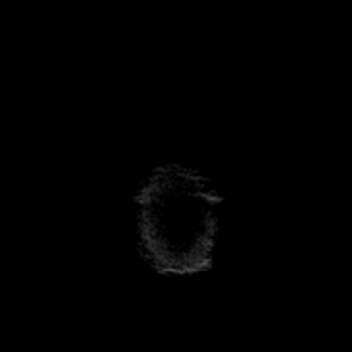

[Series 6: ax dwi_adc · axial · 3.0mm · 0.65mm/px · z∈[-42,+106]mm · 4 of 48 slices shown]
[im 1/48]
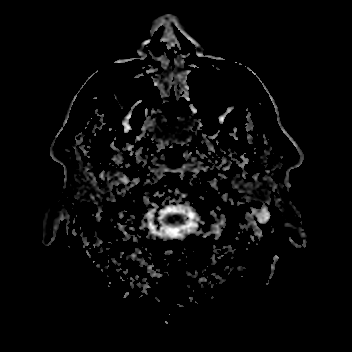
[im 16/48]
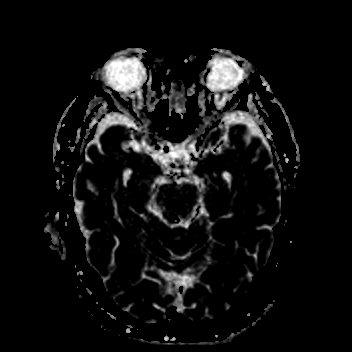
[im 32/48]
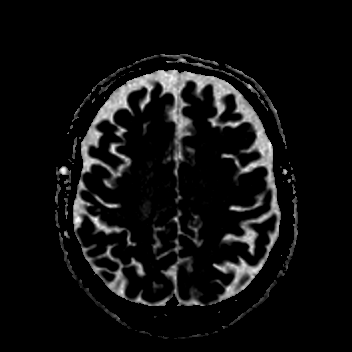
[im 48/48]
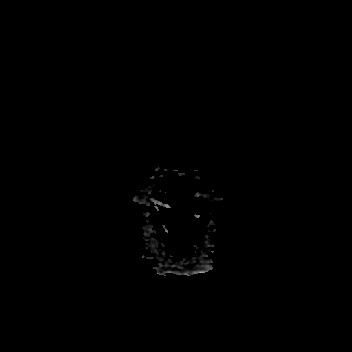

[Series 7: cor dwi_tracew · coronal · 5.0mm · 0.65mm/px · 2 of 40 slices shown]
[im 1/40]
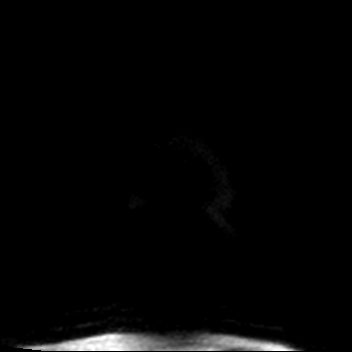
[im 40/40]
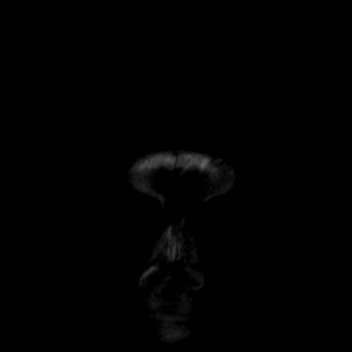

[Series 8: cor dwi_adc · coronal · 5.0mm · 0.65mm/px · 2 of 40 slices shown]
[im 1/40]
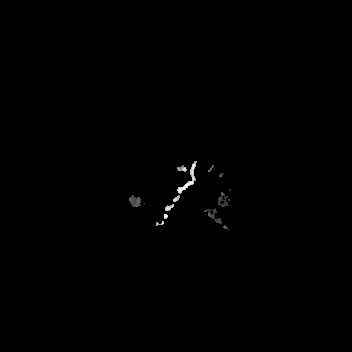
[im 40/40]
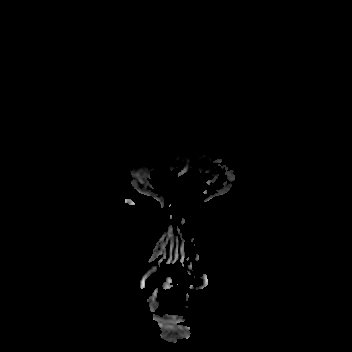

[Series 9: T1 · sagittal · 5.0mm · 0.62mm/px · 1 of 23 slices shown (1 of 2)]
[im 1/23]
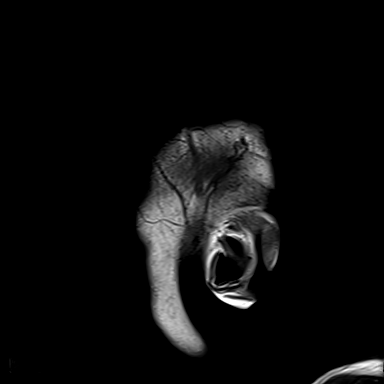

[Series 10: T2 · axial · 5.0mm · 0.53mm/px · 1 of 25 slices shown]
[im 1/25]
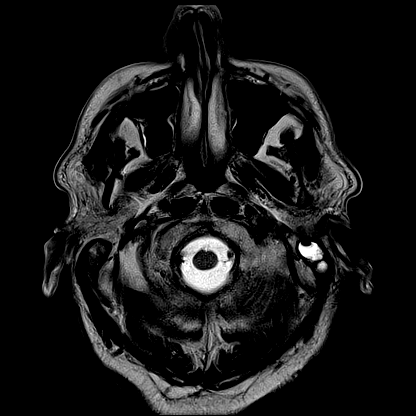

[Series 12: pha_images · axial · 3.0mm · 0.90mm/px · z∈[-52,+111]mm · 3 of 58 slices shown]
[im 1/58]
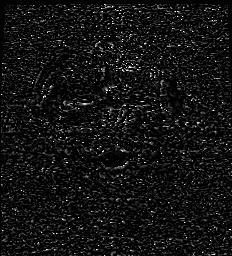
[im 29/58]
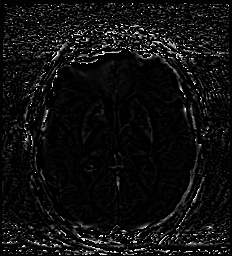
[im 58/58]
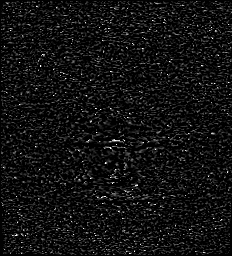

[Series 13: swi_images · axial · 3.0mm · 0.90mm/px · z∈[-52,+116]mm · 3 of 60 slices shown]
[im 1/60]
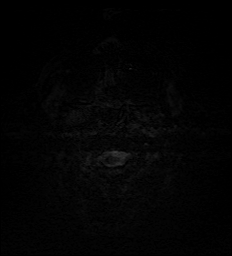
[im 30/60]
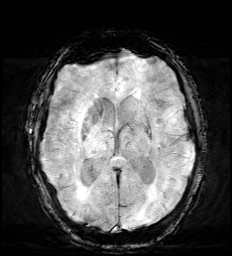
[im 60/60]
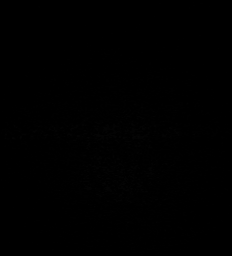

[Series 15: FLAIR · axial · 3.0mm · 0.53mm/px · z∈[-47,+108]mm · 3 of 55 slices shown]
[im 1/55]
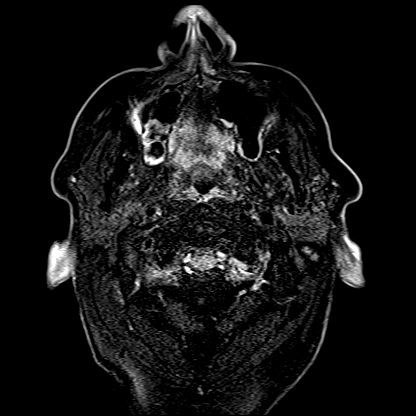
[im 28/55]
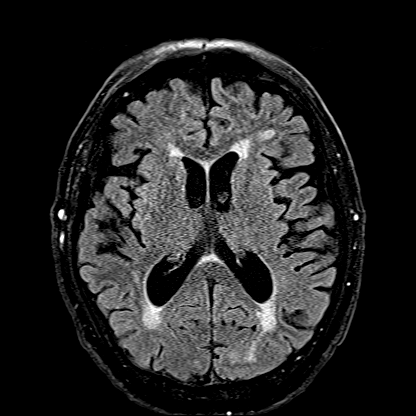
[im 55/55]
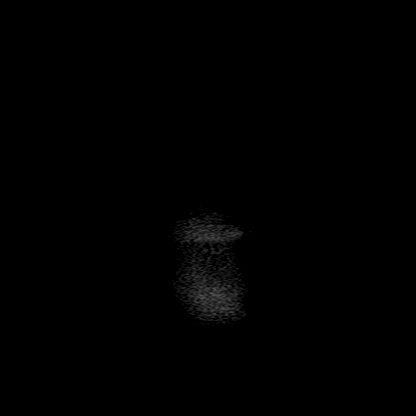

[Series 16: T1 · axial · 1.0mm · 0.98mm/px · z∈[-48,+116]mm · 10 of 174 slices shown (2 of 2)]
[im 1/174]
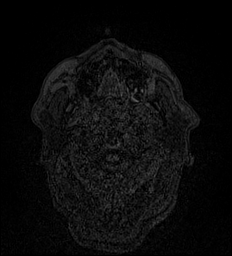
[im 20/174]
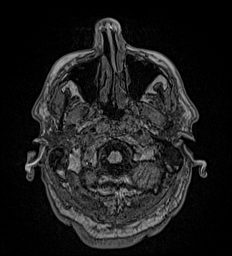
[im 39/174]
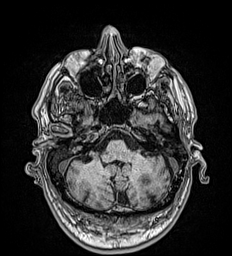
[im 58/174]
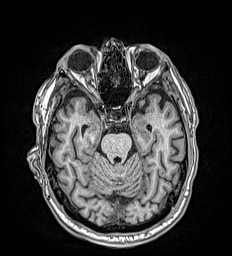
[im 77/174]
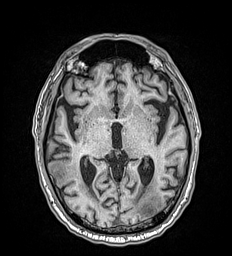
[im 97/174]
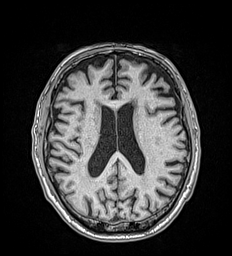
[im 116/174]
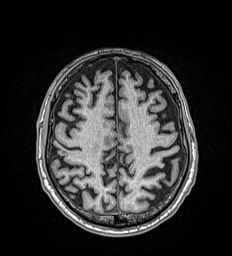
[im 135/174]
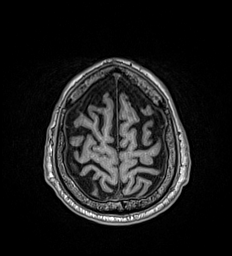
[im 154/174]
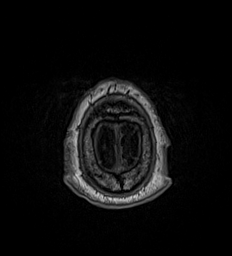
[im 174/174]
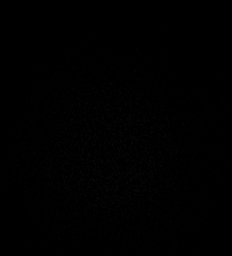

[Series 17: T2 post-contrast · coronal · 5.0mm · 0.57mm/px · 2 of 29 slices shown]
[im 1/29]
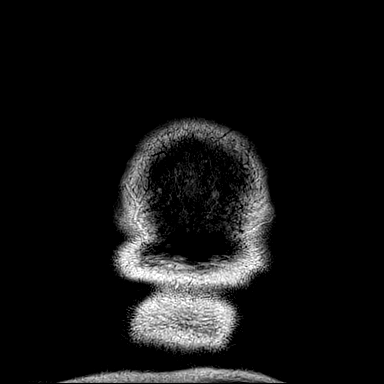
[im 29/29]
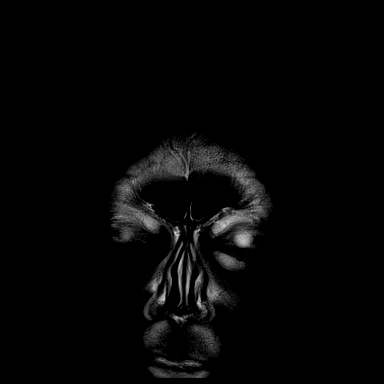

[Series 18: T1 post-contrast · axial · 1.0mm · 0.98mm/px · z∈[-48,+118]mm · 10 of 176 slices shown (1 of 3)]
[im 1/176]
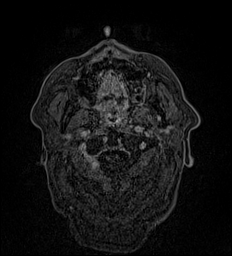
[im 20/176]
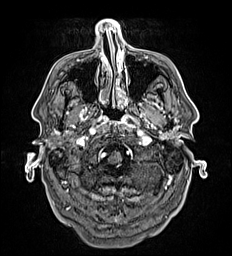
[im 39/176]
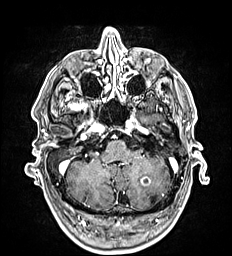
[im 59/176]
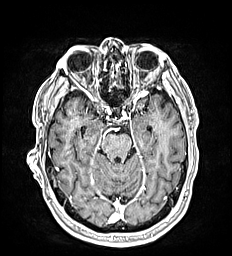
[im 78/176]
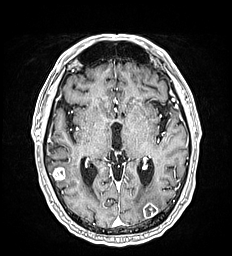
[im 98/176]
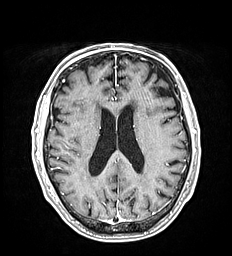
[im 117/176]
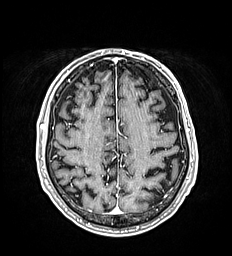
[im 137/176]
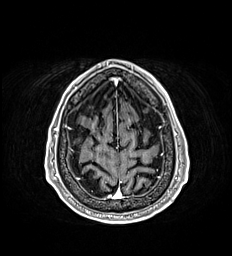
[im 156/176]
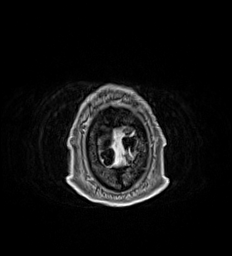
[im 176/176]
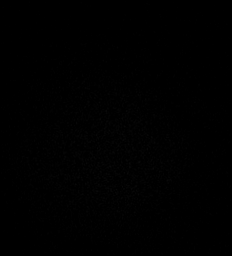

[Series 19: T1 post-contrast · coronal · 5.0mm · 0.57mm/px · 2 of 29 slices shown (2 of 3)]
[im 1/29]
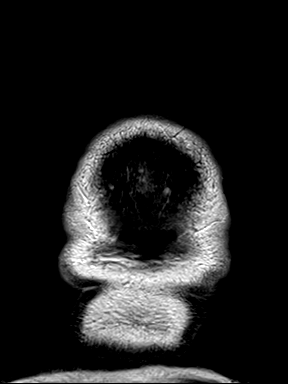
[im 29/29]
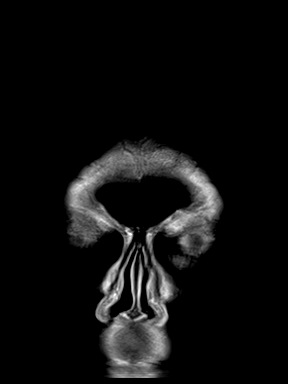

[Series 20: T1 post-contrast · sagittal · 5.0mm · 0.62mm/px · 1 of 23 slices shown (3 of 3)]
[im 1/23]
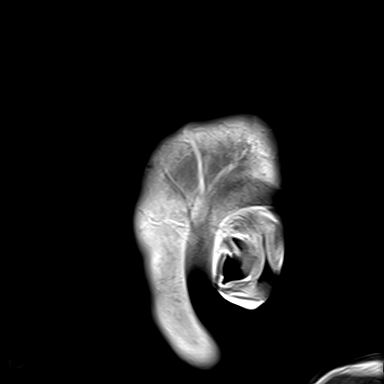

[48 of 48 positions shown; findings below may reference images not displayed]

FINDINGS: Brain: Decrease in size of metastatic lesions. Representative
examples include left cerebellar metastasis on series 18, image 43
measuring 1.5 cm (previously 2.1 cm); right temporal lesion on image
78 measuring 1.2 cm (previously 2.3 cm); left lateral occipital
lesion on image 76 measuring 1.4 cm (previously 2.4 cm). Associated
edema is also decreased. No new lesion.

No acute infarction. No hydrocephalus. Prominence of the ventricles
and sulci reflects stable parenchymal volume loss. Patchy T2
hyperintensity in the supratentorial white matter addition to
residual edema is nonspecific but probably reflects stable chronic
microvascular ischemic changes.

Vascular: Major vessel flow voids at the skull base are preserved.

Skull and upper cervical spine: Normal marrow signal is preserved.

Sinuses/Orbits: Minor mucosal thickening. Bilateral lens
replacements. Left scleral buckle.

Other: Sella is unremarkable. Persistent bilateral mastoid effusions
IMPRESSION: Significant improvement in size of metastatic lesions and associated
edema reflecting treatment response.

## 2020-11-20 MED ORDER — GADOBUTROL 1 MMOL/ML IV SOLN
9.0000 mL | Freq: Once | INTRAVENOUS | Status: AC | PRN
  Administered 2020-11-20: 9 mL via INTRAVENOUS

## 2020-11-24 ENCOUNTER — Inpatient Hospital Stay: Payer: Medicare PPO

## 2020-11-24 ENCOUNTER — Telehealth: Payer: Self-pay | Admitting: Internal Medicine

## 2020-11-24 ENCOUNTER — Encounter: Payer: Self-pay | Admitting: Internal Medicine

## 2020-11-24 ENCOUNTER — Inpatient Hospital Stay: Payer: Medicare PPO | Admitting: Internal Medicine

## 2020-11-24 DIAGNOSIS — C3411 Malignant neoplasm of upper lobe, right bronchus or lung: Secondary | ICD-10-CM | POA: Diagnosis not present

## 2020-11-24 DIAGNOSIS — Z5111 Encounter for antineoplastic chemotherapy: Secondary | ICD-10-CM | POA: Diagnosis not present

## 2020-11-24 LAB — CBC WITH DIFFERENTIAL/PLATELET
Abs Immature Granulocytes: 0.03 10*3/uL (ref 0.00–0.07)
Basophils Absolute: 0.1 10*3/uL (ref 0.0–0.1)
Basophils Relative: 2 %
Eosinophils Absolute: 0.3 10*3/uL (ref 0.0–0.5)
Eosinophils Relative: 6 %
HCT: 35.6 % — ABNORMAL LOW (ref 39.0–52.0)
Hemoglobin: 12 g/dL — ABNORMAL LOW (ref 13.0–17.0)
Immature Granulocytes: 1 %
Lymphocytes Relative: 18 %
Lymphs Abs: 0.9 10*3/uL (ref 0.7–4.0)
MCH: 32.4 pg (ref 26.0–34.0)
MCHC: 33.7 g/dL (ref 30.0–36.0)
MCV: 96.2 fL (ref 80.0–100.0)
Monocytes Absolute: 0.9 10*3/uL (ref 0.1–1.0)
Monocytes Relative: 18 %
Neutro Abs: 2.7 10*3/uL (ref 1.7–7.7)
Neutrophils Relative %: 55 %
Platelets: 239 10*3/uL (ref 150–400)
RBC: 3.7 MIL/uL — ABNORMAL LOW (ref 4.22–5.81)
RDW: 16.3 % — ABNORMAL HIGH (ref 11.5–15.5)
WBC: 5 10*3/uL (ref 4.0–10.5)
nRBC: 0 % (ref 0.0–0.2)

## 2020-11-24 LAB — COMPREHENSIVE METABOLIC PANEL
ALT: 16 U/L (ref 0–44)
AST: 25 U/L (ref 15–41)
Albumin: 3.6 g/dL (ref 3.5–5.0)
Alkaline Phosphatase: 40 U/L (ref 38–126)
Anion gap: 12 (ref 5–15)
BUN: 11 mg/dL (ref 8–23)
CO2: 23 mmol/L (ref 22–32)
Calcium: 9.9 mg/dL (ref 8.9–10.3)
Chloride: 103 mmol/L (ref 98–111)
Creatinine, Ser: 0.97 mg/dL (ref 0.61–1.24)
GFR, Estimated: >60 mL/min (ref >60)
Glucose, Bld: 170 mg/dL — ABNORMAL HIGH (ref 70–99)
Potassium: 3.3 mmol/L — ABNORMAL LOW (ref 3.5–5.1)
Sodium: 138 mmol/L (ref 135–145)
Total Bilirubin: 0.9 mg/dL (ref 0.3–1.2)
Total Protein: 6.6 g/dL (ref 6.5–8.1)

## 2020-11-24 MED ORDER — PALONOSETRON HCL INJECTION 0.25 MG/5ML
0.2500 mg | Freq: Once | INTRAVENOUS | Status: AC
  Administered 2020-11-24: 0.25 mg via INTRAVENOUS
  Filled 2020-11-24: qty 5

## 2020-11-24 MED ORDER — SODIUM CHLORIDE 0.9 % IV SOLN
520.0000 mg | Freq: Once | INTRAVENOUS | Status: AC
  Administered 2020-11-24: 520 mg via INTRAVENOUS
  Filled 2020-11-24: qty 52

## 2020-11-24 MED ORDER — HEPARIN SOD (PORK) LOCK FLUSH 100 UNIT/ML IV SOLN
INTRAVENOUS | Status: AC
  Filled 2020-11-24: qty 5

## 2020-11-24 MED ORDER — SODIUM CHLORIDE 0.9 % IV SOLN
500.0000 mg/m2 | Freq: Once | INTRAVENOUS | Status: AC
  Administered 2020-11-24: 1000 mg via INTRAVENOUS
  Filled 2020-11-24: qty 40

## 2020-11-24 MED ORDER — HEPARIN SOD (PORK) LOCK FLUSH 100 UNIT/ML IV SOLN
500.0000 [IU] | Freq: Once | INTRAVENOUS | Status: AC | PRN
  Administered 2020-11-24: 500 [IU]
  Filled 2020-11-24: qty 5

## 2020-11-24 MED ORDER — SODIUM CHLORIDE 0.9 % IV SOLN
150.0000 mg | Freq: Once | INTRAVENOUS | Status: AC
  Administered 2020-11-24: 150 mg via INTRAVENOUS
  Filled 2020-11-24: qty 150

## 2020-11-24 MED ORDER — DEXAMETHASONE SODIUM PHOSPHATE 10 MG/ML IJ SOLN
4.0000 mg | Freq: Once | INTRAMUSCULAR | Status: AC
Start: 2020-11-24 — End: 2020-11-24
  Administered 2020-11-24: 4 mg via INTRAVENOUS
  Filled 2020-11-24: qty 1

## 2020-11-24 MED ORDER — SODIUM CHLORIDE 0.9 % IV SOLN
200.0000 mg | Freq: Once | INTRAVENOUS | Status: AC
  Administered 2020-11-24: 200 mg via INTRAVENOUS
  Filled 2020-11-24: qty 8

## 2020-11-24 MED ORDER — CYANOCOBALAMIN 1000 MCG/ML IJ SOLN
1000.0000 ug | Freq: Once | INTRAMUSCULAR | Status: AC
  Administered 2020-11-24: 1000 ug via INTRAMUSCULAR
  Filled 2020-11-24: qty 1

## 2020-11-24 MED ORDER — SODIUM CHLORIDE 0.9 % IV SOLN: 4.0000 mg | Freq: Once | INTRAVENOUS | Status: DC

## 2020-11-24 MED ORDER — SODIUM CHLORIDE 0.9 % IV SOLN
Freq: Once | INTRAVENOUS | Status: AC
Start: 2020-11-24 — End: 2020-11-24
  Filled 2020-11-24: qty 250

## 2020-11-24 NOTE — Patient Instructions (Signed)
[facility]  Discharge Instructions: Thank you for choosing [facility] to provide your oncology and hematology care.  If you have a lab appointment with the [facility], please go directly to the [facility] and check in at the registration area.  Wear comfortable clothing and clothing appropriate for easy access to any Portacath or PICC line.   We strive to give you quality time with your provider. You may need to reschedule your appointment if you arrive late (15 or more minutes).  Arriving late affects you and other patients whose appointments are after yours.  Also, if you miss three or more appointments without notifying the office, you may be dismissed from the clinic at the provider's discretion.      For prescription refill requests, have your pharmacy contact our office and allow 72 hours for refills to be completed.    Today you received the following chemotherapy and/or immunotherapy agents:  Keytruda, Alimita, Carboplatin     To help prevent nausea and vomiting after your treatment, we encourage you to take your nausea medication as directed.  BELOW ARE SYMPTOMS THAT SHOULD BE REPORTED IMMEDIATELY: . *FEVER GREATER THAN 100.4 F (38 C) OR HIGHER . *CHILLS OR SWEATING . *NAUSEA AND VOMITING THAT IS NOT CONTROLLED WITH YOUR NAUSEA MEDICATION . *UNUSUAL SHORTNESS OF BREATH . *UNUSUAL BRUISING OR BLEEDING . *URINARY PROBLEMS (pain or burning when urinating, or frequent urination) . *BOWEL PROBLEMS (unusual diarrhea, constipation, pain near the anus) . TENDERNESS IN MOUTH AND THROAT WITH OR WITHOUT PRESENCE OF ULCERS (sore throat, sores in mouth, or a toothache) . UNUSUAL RASH, SWELLING OR PAIN  . UNUSUAL VAGINAL DISCHARGE OR ITCHING   Items with * indicate a potential emergency and should be followed up as soon as possible or go to the Emergency Department if any problems should occur.  Please show the CHEMOTHERAPY ALERT CARD  or IMMUNOTHERAPY ALERT CARD at check-in to the Emergency Department and triage nurse.  Should you have questions after your visit or need to cancel or reschedule your appointment, please contact [facility]  [phone] and follow the prompts.  Office hours are 8:00 a.m. to 4:30 p.m. Monday - Friday. Please note that voicemails left after 4:00 p.m. may not be returned until the following business day.  We are closed weekends and major holidays. You have access to a nurse at all times for urgent questions. Please call the main number to the clinic [phone] and follow the prompts.  For any non-urgent questions, you may also contact your provider using MyChart. We now offer e-Visits for anyone 74 and older to request care online for non-urgent symptoms. For details visit [email or web address].   Also download the MyChart app! Go to the app store, search "MyChart", open the app, select [facility], and log in with your MyChart username and password.  Due to Covid, a mask is required upon entering the hospital/clinic. If you do not have a mask, one will be given to you upon arrival. For doctor visits, patients may have 1 support person aged 74 or older with them. For treatment visits, patients cannot have anyone with them due to current Covid guidelines and our immunocompromised population.

## 2020-11-24 NOTE — Telephone Encounter (Signed)
On 4/21- spoke to pt re: improved metastases on brain mri- follow up as planned.  GB

## 2020-11-24 NOTE — Assessment & Plan Note (Addendum)
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 Currently on [place]  # proceed with carbo-alimta-keytruda #3 today. Labs today reviewed;  acceptable for treatment today.   # Brain metastases- s/p dex ; s/p WBRT [finished FEB 18th, 2022]; OFF Keppra; MRI April 24th-IMPROVED.   #Poorly controlled diabetes-secondary steroids; on Metformin 500 mg twice daily;glipizide 5 mg with breakfast.  Today blood sugars 177--monitir for now.   # DISPOSITION: # carbo-alimta-keytruda today # follow up as planned in 3 weeks-X- MD; labs- carbo-alimat-keytruda;CT chest prior -Dr.B  # I reviewed the blood work- with the patient in detail; also reviewed the imaging independently [as summarized above]; and with the patient in detail.

## 2020-11-24 NOTE — Progress Notes (Signed)
[place] CONSULT NOTE  Patient Care Team: [place] Pink, MD as PCP - General (Family Medicine) Ottie Glazier, MD as Consulting Physician (Pulmonary Disease) Telford Nab, RN as Oncology Nurse Navigator  CHIEF COMPLAINTS/PURPOSE OF CONSULTATION: Metastatic lung cancer    Oncology History Overview Note  # JAN 17th, 2022-Headaches/Vomitting [Dr.Brasington; ophthalmology brain MRI]-multiple enhancing lesions the brain- bilateral cerebellar and cerebral hemispheres, concerning for metastatic disease. The largest lesions are seen in the left cerebellar hemisphere (22 mm), right temporal lobe (26 mm) and left occipital lobe (29 mm) with prominent surrounding vasogenic edema causing effacement of the adjacent cerebral sulci. 2. Flattening of the bilateral optic disc, right greater than left, likely related to increased intracranial pressure. Right mastoid effusion.  PET scan- JAN 25th, 2022-Hypermetabolic mediastinal lymph nodes;  No significant metabolic activity associated with small RIGHT lower lobe pulmonary nodule. This nodule remains indeterminate;  No primary malignancy identified on skull base to thigh PET-CT; Scan; Hypermetabolic brain metastasis noted; bladder nodules [on CT scan]-  DIAGNOSIS:  A. LYMPH NODE, STATION 4R (RIGHT LOWER PARATRACHEAL); EBUS FNA:  - POSITIVE FOR MALIGNANCY.  - METASTATIC ADENOCARCINOMA, TTF1 POSITIVE.   #Lung cancer-adenocarcinoma TTF-1 positive [mediastinal lymph node biopsy;Dr.Aleskerov];   # 2/18- [finsihed WBRT]  # 10/13/2020- carbo-alimta-keytruda  #Bladder nodules [chronic; asymptomatic-no cystoscopy]; right colon uptake on PET scan- Dr.Anna colonoscopy-Feb2022- NEG.   # PVD [Dr.Schneir s/p stenting]  # NGS/MOLECULAR TESTS:OMNISEQ- PDL-1  <1%; TBM-H; Common targets-NEG; UNCOMMON- QNS    # PALLIATIVE CARE EVALUATION:  # PAIN MANAGEMENT:    DIAGNOSIS: Lung cancer  STAGE:   IV      ;  GOALS:pallaitive  CURRENT/MOST  RECENT THERAPY :  WBRT     Brain metastasis ([facility])  08/21/2020 Initial Diagnosis   Brain metastasis ([facility])   Primary cancer of right upper lobe of lung ([facility])  09/06/2020 Initial Diagnosis   Primary cancer of right upper lobe of lung ([place])   09/06/2020 Cancer Staging   Staging form: Lung, AJCC 8th Edition - Clinical: Stage IVB (cT1, cN3, pM1c) - Signed by Cammie Sickle, MD on 09/06/2020   10/13/2020 -  Chemotherapy    Patient is on Treatment Plan: LUNG CARBOPLATIN / PEMETREXED / PEMBROLIZUMAB Q21D INDUCTION X 4 CYCLES / MAINTENANCE PEMETREXED + PEMBROLIZUMAB        HISTORY OF PRESENTING ILLNESS:  Ora L Pieroni 75 y.o.  male metastatic lung cancer to brain currently s/p [place] Alimta [name]  is here for follow-up/review results of the MRI brain.  Patient denies any nausea vomiting.  Any headaches.  No skin rash.  No chest pain.  Review of Systems  Constitutional: Negative for chills, diaphoresis, fever, malaise/fatigue and weight loss.  HENT: Negative for nosebleeds and sore throat.   Eyes: Negative for double vision.  Respiratory: Negative for cough, hemoptysis and wheezing.   Cardiovascular: Negative for chest pain, palpitations, orthopnea and leg swelling.  Gastrointestinal: Negative for abdominal pain, blood in stool, constipation, diarrhea, heartburn, melena, nausea and vomiting.  Genitourinary: Negative for dysuria, frequency and urgency.  Musculoskeletal: Positive for back pain. Negative for joint pain.  Skin: Positive for rash. Negative for itching.  Neurological: Negative for dizziness, tingling, focal weakness and weakness.  Endo/Heme/Allergies: Does not bruise/bleed easily.  Psychiatric/Behavioral: Negative for depression. The patient is not nervous/anxious and does not have insomnia.      MEDICAL HISTORY:  Past Medical History:  Diagnosis Date  . Abscess of sigmoid colon   . Cancer [facility])    brain tumor  .  Diabetes mellitus without complication ([place])   .  GERD (gastroesophageal reflux disease)   . Hypercholesteremia   . Hypertension   . Peripheral vascular disease ([place])   . PUD (peptic ulcer disease)     SURGICAL HISTORY: Past Surgical History:  Procedure Laterality Date  . CHOLECYSTECTOMY    . COLONOSCOPY WITH PROPOFOL N/A 07/19/2016   Procedure: COLONOSCOPY WITH PROPOFOL;  Surgeon: Jonathon Bellows, MD;  Location: [facility] ORS;  Service: Endoscopy;  Laterality: N/A;  . COLONOSCOPY WITH PROPOFOL N/A 09/02/2016   Procedure: COLONOSCOPY WITH PROPOFOL;  Surgeon: Jonathon Bellows, MD;  Location: [facility] ENDOSCOPY;  Service: Endoscopy;  Laterality: N/A;  . COLONOSCOPY WITH PROPOFOL N/A 09/04/2020   Procedure: COLONOSCOPY WITH PROPOFOL;  Surgeon: Jonathon Bellows, MD;  Location: [facility] ENDOSCOPY;  Service: Gastroenterology;  Laterality: N/A;  . femoral stents Bilateral   . FLEXIBLE SIGMOIDOSCOPY  07/19/2016   Procedure: FLEXIBLE SIGMOIDOSCOPY;  Surgeon: Jonathon Bellows, MD;  Location: [facility] ORS;  Service: Endoscopy;;  . HERNIA REPAIR    . IR IMAGING GUIDED PORT INSERTION  10/10/2020  . RETINAL DETACHMENT SURGERY    . TONSILLECTOMY    . VIDEO BRONCHOSCOPY WITH ENDOBRONCHIAL ULTRASOUND N/A 09/01/2020   Procedure: VIDEO BRONCHOSCOPY WITH ENDOBRONCHIAL ULTRASOUND;  Surgeon: Ottie Glazier, MD;  Location: [facility] ORS;  Service: Thoracic;  Laterality: N/A;    SOCIAL HISTORY: Social History   Socioeconomic History  . Marital status: Married    Spouse name: Katharine Look  . Number of children: Not on file  . Years of education: Not on file  . Highest education level: Not on file  Occupational History    Comment: retired  Tobacco Use  . Smoking status: Former Smoker    Quit date: 2001    Years since quitting: 21.3  . Smokeless tobacco: Never Used  Vaping Use  . Vaping Use: Never used  Substance and Sexual Activity  . Alcohol use: No  . Drug use: No  . Sexual activity: Not on file  Other Topics Concern  . Not on file  Social History Narrative   Lives in [place]; lives with wife;  1 son- lives close by.[id number]- Quit smoking- 2001. No Alcohol.    Feels safe in his home.   Social Determinants of Health   Financial Resource Strain: Not on file  Food Insecurity: Not on file  Transportation Needs: Not on file  Physical Activity: Not on file  Stress: Not on file  Social Connections: Not on file  Intimate Partner Violence: Not on file    FAMILY HISTORY: Family History  Problem Relation Age of Onset  . Diabetes Mother   . Hypertension Mother   . Diabetes Father     ALLERGIES:  is allergic to atorvastatin.  MEDICATIONS:  Current Outpatient Medications  Medication Sig Dispense Refill  . amLODipine (NORVASC) 10 MG tablet Take 10 mg by mouth daily.    Marland Kitchen aspirin EC 81 MG tablet Take 81 mg by mouth daily.    . fluticasone (FLONASE) 50 MCG/ACT nasal spray Place 1 spray into both nostrils daily as needed for allergies.    . folic acid (FOLVITE) 1 MG tablet Take 1 tablet (1 mg total) by mouth daily. 90 tablet 1  . glipiZIDE (GLUCOTROL) 5 MG tablet TAKE 1 TABLET BY MOUTH ONCE A DAY BEFOREBREAKFAST 30 tablet 0  . glucose blood (ONETOUCH ULTRA) test strip USE AS DIRECTED TWICE A DAY TO CHECK BLOOD SUGAR    . lidocaine-prilocaine (EMLA) cream Apply 1 application topically as needed. Apply 30-45 mins  prior to port access. 30 g 3  . metFORMIN (GLUCOPHAGE-XR) 500 MG 24 hr tablet Take 500 mg by mouth 2 (two) times daily.    . Omega-3 Fatty Acids (OMEGA-3 FISH OIL) 500 MG CAPS Take 500 mg by mouth daily.    . ondansetron (ZOFRAN) 8 MG tablet One pill every 8 hours as needed for nausea/vomitting. 40 tablet 1  . pantoprazole (PROTONIX) 40 MG tablet Take 40 mg by mouth daily.    Marland Kitchen ROCKLATAN 0.02-0.005 % SOLN Place 1 drop into the left eye daily.    . rosuvastatin (CRESTOR) 5 MG tablet Take 5 mg by mouth at bedtime.    . Saw Palmetto, Serenoa repens, (SAW PALMETTO PO) Take 450 mg by mouth daily.     No current facility-administered medications for this visit.       Marland Kitchen  PHYSICAL EXAMINATION: ECOG PERFORMANCE STATUS: 1 - Symptomatic but completely ambulatory  Vitals:   11/24/20 0824  BP: 123/68  Pulse: 80  Resp: 16  Temp: 97.6 F (36.4 C)  SpO2: 98%   Filed Weights   11/24/20 0824  Weight: 196 lb (88.9 kg)    Physical Exam Constitutional:      Comments: Patient is walking independently.  Accompanied by his wife.  HENT:     Head: Normocephalic and atraumatic.     Mouth/Throat:     Pharynx: No oropharyngeal exudate.  Eyes:     Pupils: Pupils are equal, round, and reactive to light.  Cardiovascular:     Rate and Rhythm: Normal rate and regular rhythm.  Pulmonary:     Effort: No respiratory distress.     Breath sounds: No wheezing.     Comments: Decreased breath sounds bilaterally at the bases.  No wheeze or crackles. Abdominal:     General: Bowel sounds are normal. There is no distension.     Palpations: Abdomen is soft. There is no mass.     Tenderness: There is no abdominal tenderness. There is no guarding or rebound.  Musculoskeletal:        General: No tenderness. Normal range of motion.     Cervical back: Normal range of motion and neck supple.  Skin:    General: Skin is warm.     Comments: Approximately 3 cm macular lesions noted on either side of the abdomen-with mild central clearing.  Neurological:     Mental Status: He is alert and oriented to person, place, and time.  Psychiatric:        Mood and Affect: Affect normal.      LABORATORY DATA:  I have reviewed the data as listed Lab Results  Component Value Date   WBC 5.0 11/24/2020   HGB 12.0 (L) 11/24/2020   HCT 35.6 (L) 11/24/2020   MCV 96.2 11/24/2020   PLT 239 11/24/2020   Recent Labs    10/23/20 0926 11/03/20 0817 11/24/20 0801  NA 139 139 138  K 3.5 3.3* 3.3*  CL 104 105 103  CO2 25 24 23   GLUCOSE 144* 243* 170*  BUN 15 12 11   CREATININE 0.95 1.06 0.97  CALCIUM 9.4 9.3 9.9  GFRNONAA >60 >60 >60  PROT 6.1* 6.1* 6.6  ALBUMIN 3.2* 3.1* 3.6   AST 24 24 25   ALT 33 18 16  ALKPHOS 45 43 40  BILITOT 0.9 0.5 0.9    RADIOGRAPHIC STUDIES: I have personally reviewed the radiological images as listed and agreed with the findings in the report. MR Brain W Wo Contrast  Result Date: 11/20/2020 CLINICAL DATA:  Metastatic lung cancer post whole brain radiation EXAM: MRI HEAD WITHOUT AND WITH CONTRAST TECHNIQUE: Multiplanar, multiecho pulse sequences of the brain and surrounding structures were obtained without and with intravenous contrast. CONTRAST:  20m GADAVIST GADOBUTROL 1 MMOL/ML IV SOLN COMPARISON:  08/18/2020 FINDINGS: Brain: Decrease in size of metastatic lesions. Representative examples include left cerebellar metastasis on series 18, image 43 measuring 1.5 cm (previously 2.1 cm); right temporal lesion on image 78 measuring 1.2 cm (previously 2.3 cm); left lateral occipital lesion on image 76 measuring 1.4 cm (previously 2.4 cm). Associated edema is also decreased. No new lesion. No acute infarction. No hydrocephalus. Prominence of the ventricles and sulci reflects stable parenchymal volume loss. Patchy T2 hyperintensity in the supratentorial white matter addition to residual edema is nonspecific but probably reflects stable chronic microvascular ischemic changes. Vascular: Major vessel flow voids at the skull base are preserved. Skull and upper cervical spine: Normal marrow signal is preserved. Sinuses/Orbits: Minor mucosal thickening. Bilateral lens replacements. Left scleral buckle. Other: Sella is unremarkable. Persistent bilateral mastoid effusions IMPRESSION: Significant improvement in size of metastatic lesions and associated edema reflecting treatment response. Electronically Signed   By: PMacy MisM.D.   On: 11/20/2020 15:07    ASSESSMENT & PLAN:   Primary cancer of right upper lobe of lung ([place] #Mediastinal for biopsy-positive for adenocarcinoma TTF-1 positive-suggestive of lung origin. Currently on c[place] #  proceed with carbo-alimta-keytruda #3 today. Labs today reviewed;  acceptable for treatment today.   # Brain metastases- s/p dex ; s/p WBRT [finished FEB 18th, 2022]; OFF Keppra; MRI April 24th-IMPROVED.   #Poorly controlled diabetes-secondary steroids; on Metformin 500 mg twice daily;glipizide 5 mg with breakfast.  Today blood sugars 177--monitir for now.   # DISPOSITION: # carbo-alimta-keytruda today # follow up as planned in 3 weeks-X- MD; labs- carbo-alimat-keytruda;CT chest prior -Dr.B  # I reviewed the blood work- with the patient in detail; also reviewed the imaging independently [as summarized above]; and with the patient in detail.      All questions were answered. The patient knows to call the clinic with any problems, questions or concerns.    GCammie Sickle MD 11/24/2020 8:59 AM

## 2020-12-11 ENCOUNTER — Ambulatory Visit
Admission: RE | Admit: 2020-12-11 | Discharge: 2020-12-11 | Disposition: A | Discharge status: Home / Self Care | Payer: Medicare PPO | Source: Ambulatory Visit | Attending: Internal Medicine | Admitting: Internal Medicine

## 2020-12-11 ENCOUNTER — Other Ambulatory Visit: Payer: Self-pay | Admitting: Internal Medicine

## 2020-12-11 ENCOUNTER — Other Ambulatory Visit: Payer: Self-pay

## 2020-12-11 DIAGNOSIS — C3411 Malignant neoplasm of upper lobe, right bronchus or lung: Secondary | ICD-10-CM | POA: Insufficient documentation

## 2020-12-11 IMAGING — CT CT CHEST W/ CM
2 of 4 series · 14 of 36 positions shown, 17 images · IV contrast (omnipaque)
Comparison: PET-CT, [DATE], CT chest abdomen pelvis, [DATE]

CLINICAL DATA: Metastatic right lung cancer, assess treatment
response

EXAM:
CT CHEST WITH CONTRAST
TECHNIQUE: Multidetector CT imaging of the chest was performed during
intravenous contrast administration.
CONTRAST:  75mL OMNIPAQUE IOHEXOL 300 MG/ML  SOLN

[Series 2: axial chest 2.00 · axial · 0.77mm/px · z∈[-1154,-912]mm · 11 of 145 slices shown, 14 images]
[im 12/145  mediastinal]
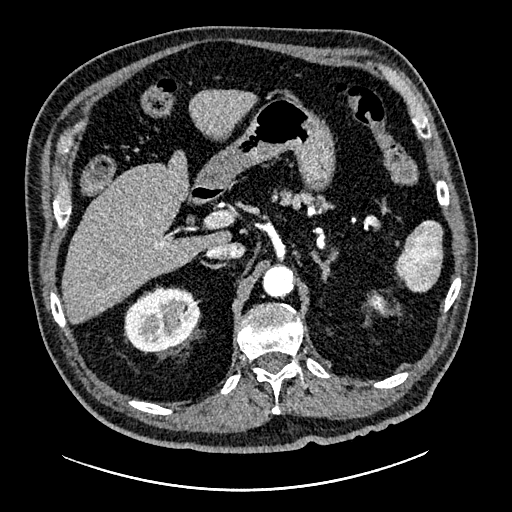
[im 12/145  lung]
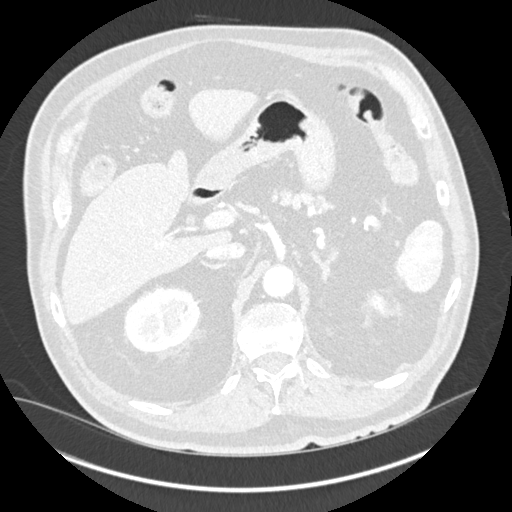
[im 23/145  lung]
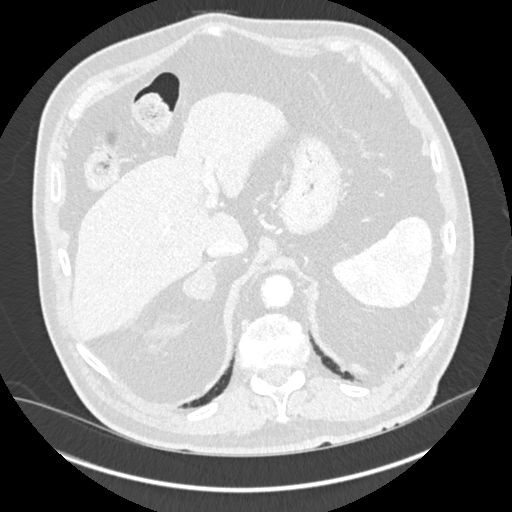
[im 34/145  lung]
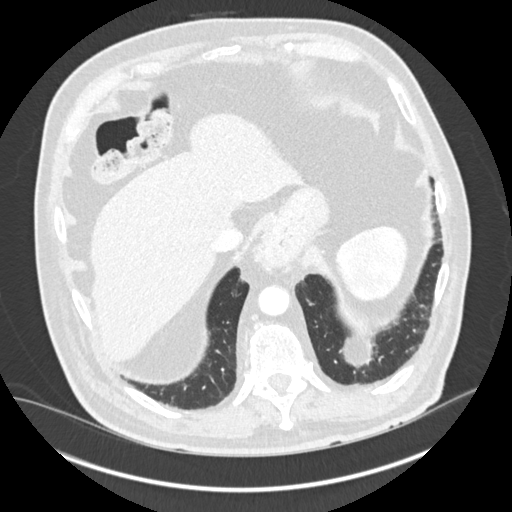
[im 45/145  lung]
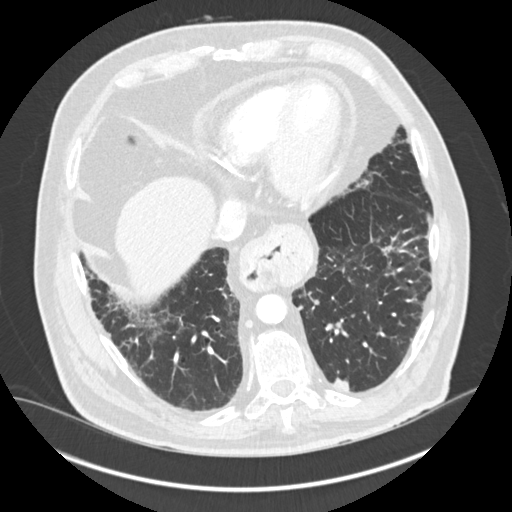
[im 56/145  mediastinal]
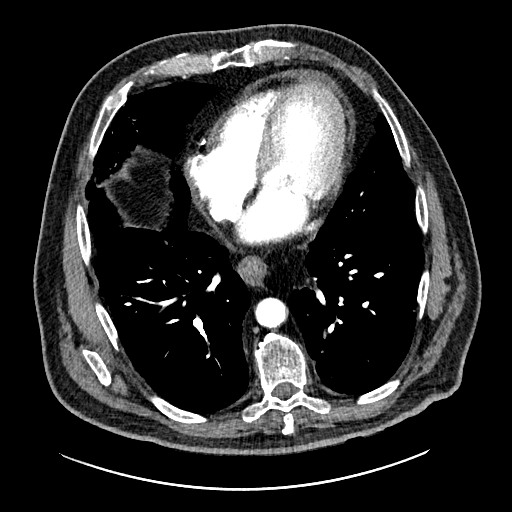
[im 56/145  lung]
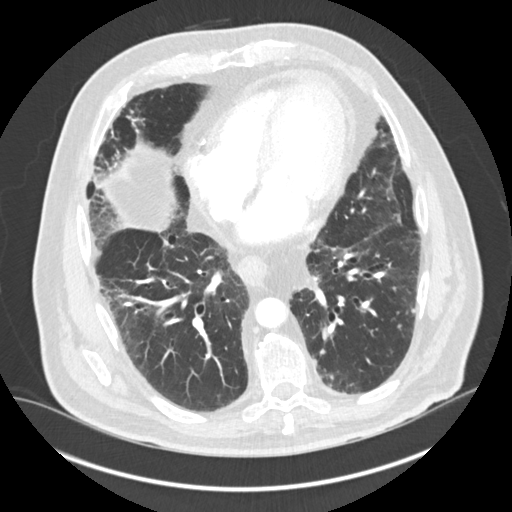
[im 78/145  lung]
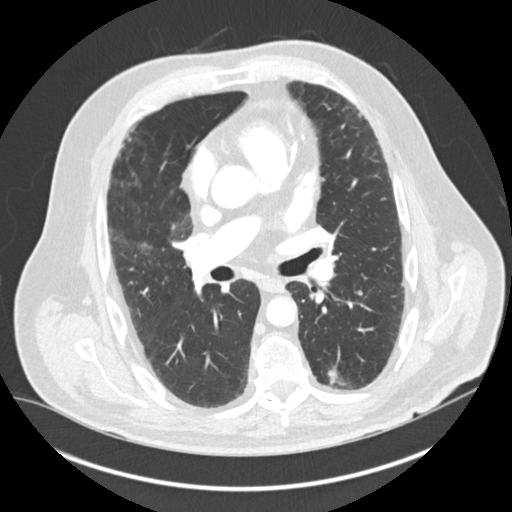
[im 89/145  lung]
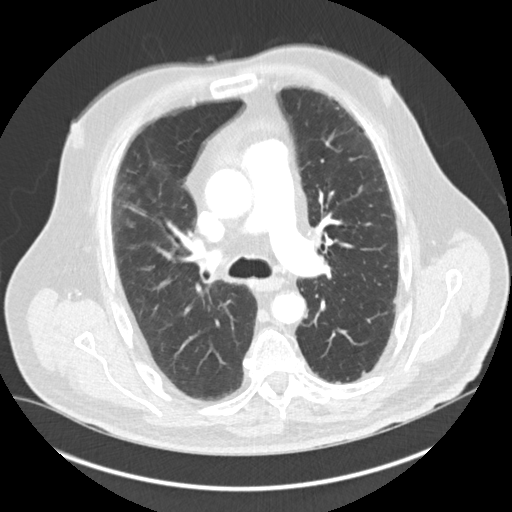
[im 100/145  lung]
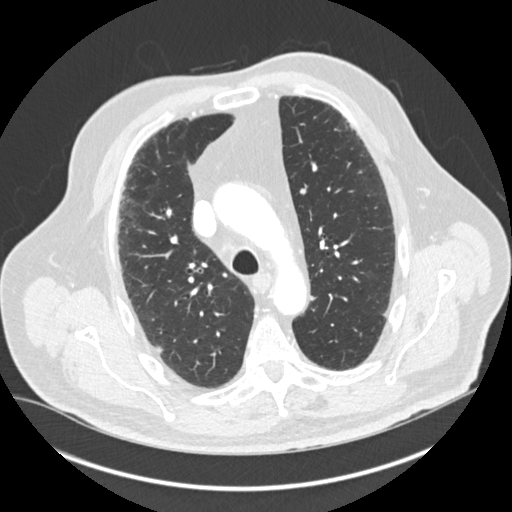
[im 111/145  mediastinal]
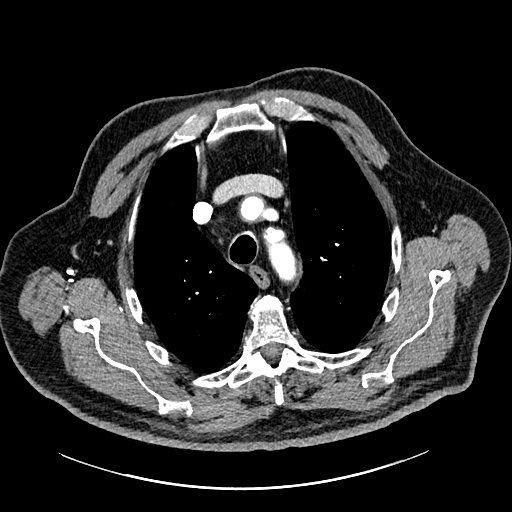
[im 111/145  lung]
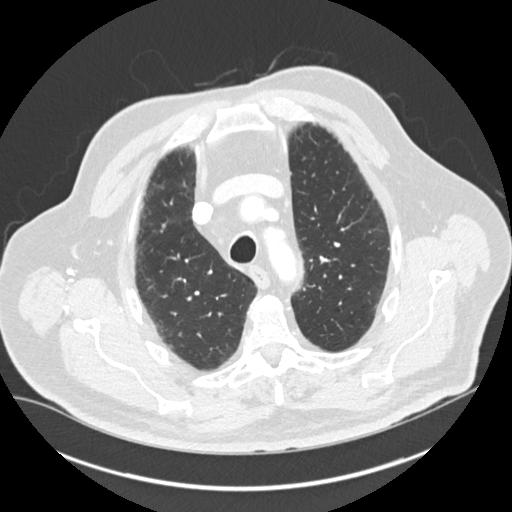
[im 122/145  lung]
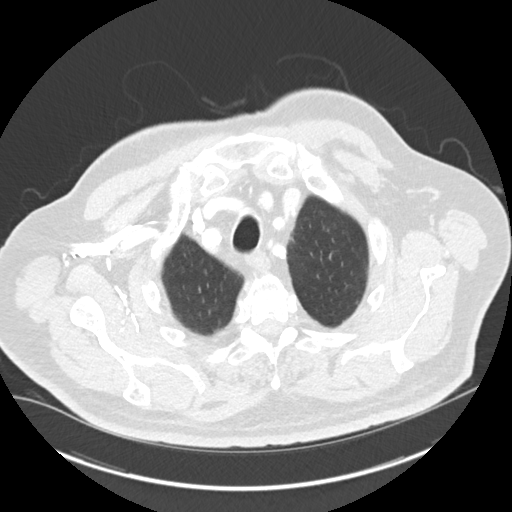
[im 133/145  lung]
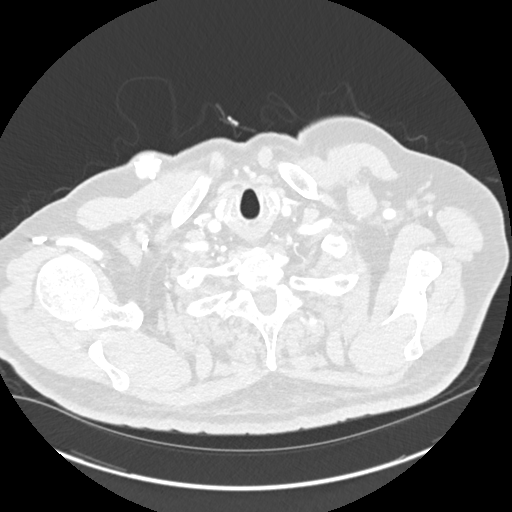

[Series 4: coronal chest 2.00 cor · coronal · 0.57mm/px · 3 of 162 slices shown]
[im 33/162  lung]
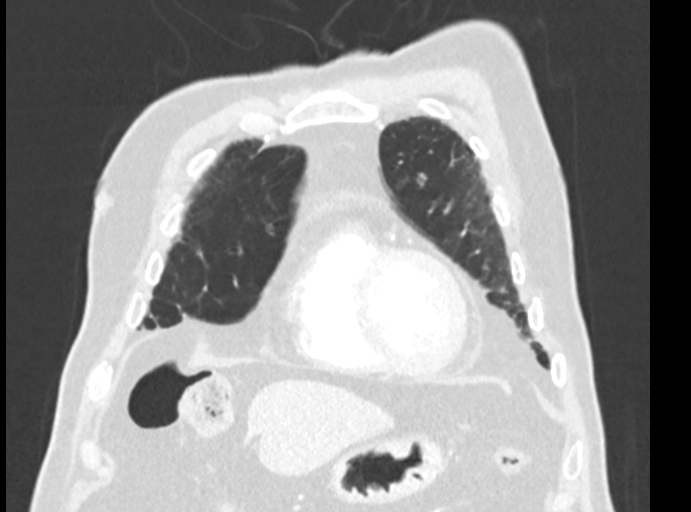
[im 65/162  lung]
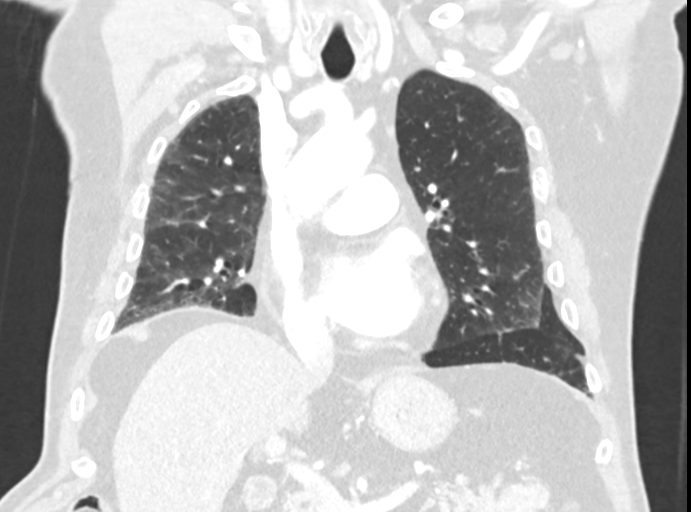
[im 97/162  lung]
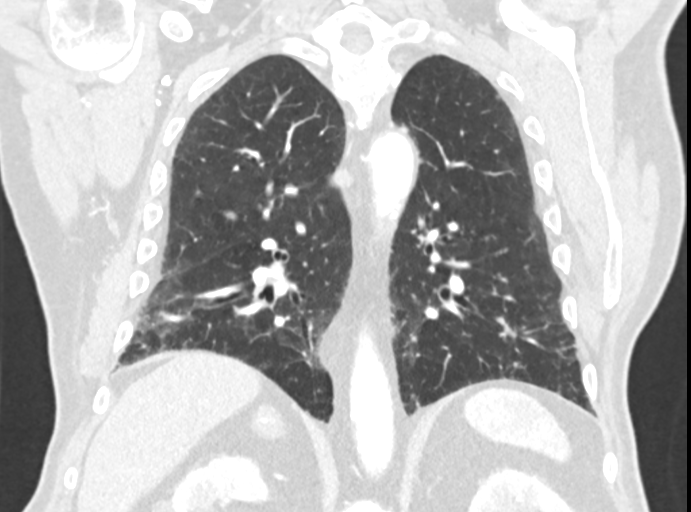

[14 of 36 positions shown; findings below may reference images not displayed]

FINDINGS: Cardiovascular: Right chest port catheter. Aortic atherosclerosis.
Aortic valve calcifications. Normal heart size. Left and right
coronary artery calcifications. Trace pericardial effusion.

Mediastinum/Nodes: Interval decrease in size of enlarged subcarinal
node, measuring 3.1 x 0.8 cm, previously 3.0 x 1.8 cm when measured
similarly (series 2, image 72). Interval resolution of a previously
enlarged pretracheal lymph node, measuring no greater than 0.8 x
cm, previously 2.0 x 1.6 cm (series 2, image 47). Thyroid gland,
trachea, and esophagus demonstrate no significant findings.

Lungs/Pleura: No significant change in a small subpleural pulmonary
nodule of the peripheral posterior right upper lobe measuring 0.7 cm
(series 3, image 48). There is a new nodule of the very inferior
posterior right upper lobe abutting the major and minor fissures
measuring 0.7 cm (series 3, image 76). There are numerous new
nodules in the left upper lobe and lingula, for example a 7 mm
nodule of the posterior lingula (series 3, image 78) and a 9 mm
nodule of the dependent left lower lobe (series 3, image 93). Mild
fibrosis of the bilateral lung bases. No pleural effusion or
pneumothorax.

Upper Abdomen: No acute abnormality. Redemonstrated benign, non FDG
avid right adrenal adenoma.

Musculoskeletal: No chest wall mass or suspicious bone lesions
identified.
IMPRESSION: 1. Significant interval decrease in size of enlarged, FDG avid
mediastinal lymph nodes, consistent with treatment response.
2. A previously seen small subpleural nodule of the peripheral
posterior right upper lobe is unchanged and remains nonspecific.
3. There are numerous new small pulmonary nodules, the majority
present in the dependent left lower lobe. These are nonspecific
although distribution generally suggests an infectious or
inflammatory etiology. Metastatic disease is however a substantial
differential consideration.
4. Coronary artery disease.

Aortic Atherosclerosis ([1T]-[1T]).

## 2020-12-11 MED ORDER — IOHEXOL 300 MG/ML  SOLN
75.0000 mL | Freq: Once | INTRAMUSCULAR | Status: AC | PRN
  Administered 2020-12-11: 75 mL via INTRAVENOUS

## 2020-12-15 ENCOUNTER — Inpatient Hospital Stay: Payer: Medicare PPO

## 2020-12-15 ENCOUNTER — Encounter: Payer: Self-pay | Admitting: Oncology

## 2020-12-15 ENCOUNTER — Other Ambulatory Visit: Payer: Self-pay

## 2020-12-15 ENCOUNTER — Inpatient Hospital Stay: Payer: Medicare PPO | Admitting: Oncology

## 2020-12-15 ENCOUNTER — Inpatient Hospital Stay: Payer: Medicare PPO | Attending: Internal Medicine

## 2020-12-15 VITALS — BP 146/69 | HR 71

## 2020-12-15 VITALS — BP 135/70 | HR 80 | Temp 98.7°F | Resp 18 | Wt 192.3 lb

## 2020-12-15 DIAGNOSIS — K259 Gastric ulcer, unspecified as acute or chronic, without hemorrhage or perforation: Secondary | ICD-10-CM | POA: Diagnosis not present

## 2020-12-15 DIAGNOSIS — K219 Gastro-esophageal reflux disease without esophagitis: Secondary | ICD-10-CM | POA: Diagnosis not present

## 2020-12-15 DIAGNOSIS — C7931 Secondary malignant neoplasm of brain: Secondary | ICD-10-CM

## 2020-12-15 DIAGNOSIS — H9193 Unspecified hearing loss, bilateral: Secondary | ICD-10-CM

## 2020-12-15 DIAGNOSIS — E1151 Type 2 diabetes mellitus with diabetic peripheral angiopathy without gangrene: Secondary | ICD-10-CM | POA: Insufficient documentation

## 2020-12-15 DIAGNOSIS — C3411 Malignant neoplasm of upper lobe, right bronchus or lung: Secondary | ICD-10-CM

## 2020-12-15 DIAGNOSIS — Z79899 Other long term (current) drug therapy: Secondary | ICD-10-CM | POA: Diagnosis not present

## 2020-12-15 DIAGNOSIS — Z5111 Encounter for antineoplastic chemotherapy: Secondary | ICD-10-CM

## 2020-12-15 DIAGNOSIS — T451X5A Adverse effect of antineoplastic and immunosuppressive drugs, initial encounter: Secondary | ICD-10-CM

## 2020-12-15 DIAGNOSIS — E7800 Pure hypercholesterolemia, unspecified: Secondary | ICD-10-CM | POA: Diagnosis not present

## 2020-12-15 DIAGNOSIS — D6481 Anemia due to antineoplastic chemotherapy: Secondary | ICD-10-CM | POA: Diagnosis not present

## 2020-12-15 DIAGNOSIS — Z87891 Personal history of nicotine dependence: Secondary | ICD-10-CM | POA: Diagnosis not present

## 2020-12-15 DIAGNOSIS — Z7982 Long term (current) use of aspirin: Secondary | ICD-10-CM | POA: Insufficient documentation

## 2020-12-15 DIAGNOSIS — I10 Essential (primary) hypertension: Secondary | ICD-10-CM | POA: Insufficient documentation

## 2020-12-15 DIAGNOSIS — Z7984 Long term (current) use of oral hypoglycemic drugs: Secondary | ICD-10-CM | POA: Insufficient documentation

## 2020-12-15 LAB — COMPREHENSIVE METABOLIC PANEL
ALT: 16 U/L (ref 0–44)
AST: 23 U/L (ref 15–41)
Albumin: 3.6 g/dL (ref 3.5–5.0)
Alkaline Phosphatase: 42 U/L (ref 38–126)
Anion gap: 9 (ref 5–15)
BUN: 11 mg/dL (ref 8–23)
CO2: 25 mmol/L (ref 22–32)
Calcium: 10 mg/dL (ref 8.9–10.3)
Chloride: 105 mmol/L (ref 98–111)
Creatinine, Ser: 0.97 mg/dL (ref 0.61–1.24)
GFR, Estimated: >60 mL/min (ref >60)
Glucose, Bld: 146 mg/dL — ABNORMAL HIGH (ref 70–99)
Potassium: 3.2 mmol/L — ABNORMAL LOW (ref 3.5–5.1)
Sodium: 139 mmol/L (ref 135–145)
Total Bilirubin: 0.7 mg/dL (ref 0.3–1.2)
Total Protein: 6.8 g/dL (ref 6.5–8.1)

## 2020-12-15 LAB — CBC WITH DIFFERENTIAL/PLATELET
Abs Immature Granulocytes: 0.01 10*3/uL (ref 0.00–0.07)
Basophils Absolute: 0.1 10*3/uL (ref 0.0–0.1)
Basophils Relative: 1 %
Eosinophils Absolute: 0.1 10*3/uL (ref 0.0–0.5)
Eosinophils Relative: 3 %
HCT: 32.8 % — ABNORMAL LOW (ref 39.0–52.0)
Hemoglobin: 11.1 g/dL — ABNORMAL LOW (ref 13.0–17.0)
Immature Granulocytes: 0 %
Lymphocytes Relative: 16 %
Lymphs Abs: 0.8 10*3/uL (ref 0.7–4.0)
MCH: 33.6 pg (ref 26.0–34.0)
MCHC: 33.8 g/dL (ref 30.0–36.0)
MCV: 99.4 fL (ref 80.0–100.0)
Monocytes Absolute: 0.8 10*3/uL (ref 0.1–1.0)
Monocytes Relative: 16 %
Neutro Abs: 3.2 10*3/uL (ref 1.7–7.7)
Neutrophils Relative %: 64 %
Platelets: 270 10*3/uL (ref 150–400)
RBC: 3.3 MIL/uL — ABNORMAL LOW (ref 4.22–5.81)
RDW: 16.3 % — ABNORMAL HIGH (ref 11.5–15.5)
WBC: 4.9 10*3/uL (ref 4.0–10.5)
nRBC: 0 % (ref 0.0–0.2)

## 2020-12-15 MED ORDER — DEXAMETHASONE SODIUM PHOSPHATE 10 MG/ML IJ SOLN
4.0000 mg | Freq: Once | INTRAMUSCULAR | Status: AC
  Administered 2020-12-15: 4 mg via INTRAVENOUS
  Filled 2020-12-15: qty 1

## 2020-12-15 MED ORDER — SODIUM CHLORIDE 0.9 % IV SOLN
500.0000 mg/m2 | Freq: Once | INTRAVENOUS | Status: AC
  Administered 2020-12-15: 1000 mg via INTRAVENOUS
  Filled 2020-12-15: qty 40

## 2020-12-15 MED ORDER — HEPARIN SOD (PORK) LOCK FLUSH 100 UNIT/ML IV SOLN
500.0000 [IU] | Freq: Once | INTRAVENOUS | Status: AC | PRN
  Administered 2020-12-15: 500 [IU]
  Filled 2020-12-15: qty 5

## 2020-12-15 MED ORDER — SODIUM CHLORIDE 0.9 % IV SOLN
4.0000 mg | Freq: Once | INTRAVENOUS | Status: DC
  Filled 2020-12-15: qty 0.4

## 2020-12-15 MED ORDER — PALONOSETRON HCL INJECTION 0.25 MG/5ML
0.2500 mg | Freq: Once | INTRAVENOUS | Status: AC
  Administered 2020-12-15: 0.25 mg via INTRAVENOUS
  Filled 2020-12-15: qty 5

## 2020-12-15 MED ORDER — SODIUM CHLORIDE 0.9 % IV SOLN
200.0000 mg | Freq: Once | INTRAVENOUS | Status: AC
  Administered 2020-12-15: 200 mg via INTRAVENOUS
  Filled 2020-12-15: qty 8

## 2020-12-15 MED ORDER — SODIUM CHLORIDE 0.9 % IV SOLN
520.0000 mg | Freq: Once | INTRAVENOUS | Status: AC
  Administered 2020-12-15: 520 mg via INTRAVENOUS
  Filled 2020-12-15: qty 52

## 2020-12-15 MED ORDER — SODIUM CHLORIDE 0.9 % IV SOLN
Freq: Once | INTRAVENOUS | Status: AC
Start: 2020-12-15 — End: 2020-12-15
  Filled 2020-12-15: qty 250

## 2020-12-15 MED ORDER — POTASSIUM CHLORIDE CRYS ER 10 MEQ PO TBCR: 10.0000 meq | EXTENDED_RELEASE_TABLET | Freq: Every day | ORAL | 0 refills | Status: AC

## 2020-12-15 MED ORDER — SODIUM CHLORIDE 0.9 % IV SOLN
150.0000 mg | Freq: Once | INTRAVENOUS | Status: AC
  Administered 2020-12-15: 150 mg via INTRAVENOUS
  Filled 2020-12-15: qty 150

## 2020-12-15 NOTE — Progress Notes (Signed)
Patient here for follow up. Pt reports worsening in hearing to both ears.

## 2020-12-15 NOTE — Progress Notes (Signed)
[place] CONSULT NOTE  Patient Care Team: [place] Pink, MD as PCP - General (Family Medicine) Ottie Glazier, MD as Consulting Physician (Pulmonary Disease) Telford Nab, RN as Oncology Nurse Navigator  CHIEF COMPLAINTS/PURPOSE OF CONSULTATION: Metastatic lung cancer    Oncology History Overview Note  # JAN 17th, 2022-Headaches/Vomitting [Dr.Brasington; ophthalmology brain MRI]-multiple enhancing lesions the brain- bilateral cerebellar and cerebral hemispheres, concerning for metastatic disease. The largest lesions are seen in the left cerebellar hemisphere (22 mm), right temporal lobe (26 mm) and left occipital lobe (29 mm) with prominent surrounding vasogenic edema causing effacement of the adjacent cerebral sulci. 2. Flattening of the bilateral optic disc, right greater than left, likely related to increased intracranial pressure. Right mastoid effusion.  PET scan- JAN 25th, 2022-Hypermetabolic mediastinal lymph nodes;  No significant metabolic activity associated with small RIGHT lower lobe pulmonary nodule. This nodule remains indeterminate;  No primary malignancy identified on skull base to thigh PET-CT; Scan; Hypermetabolic brain metastasis noted; bladder nodules [on CT scan]-  DIAGNOSIS:  A. LYMPH NODE, STATION 4R (RIGHT LOWER PARATRACHEAL); EBUS FNA:  - POSITIVE FOR MALIGNANCY.  - METASTATIC ADENOCARCINOMA, TTF1 POSITIVE.   #Lung cancer-adenocarcinoma TTF-1 positive [mediastinal lymph node biopsy;Dr.Aleskerov];   # 2/18- [finsihed WBRT]  # 10/13/2020- carbo-alimta-keytruda  #Bladder nodules [chronic; asymptomatic-no cystoscopy]; right colon uptake on PET scan- Dr.Anna colonoscopy-Feb2022- NEG.   # PVD [Dr.Schneir s/p stenting]  # NGS/MOLECULAR TESTS:OMNISEQ- PDL-1  <1%; TBM-H; Common targets-NEG; UNCOMMON- QNS    # PALLIATIVE CARE EVALUATION:  # PAIN MANAGEMENT:    DIAGNOSIS: Lung cancer  STAGE:   IV      ;  GOALS:pallaitive  CURRENT/MOST  RECENT THERAPY :  WBRT     Brain metastasis ([place])  08/21/2020 Initial Diagnosis   Brain metastasis (HCC)   Primary cancer of right upper lobe of lung ([place])  09/06/2020 Initial Diagnosis   Primary cancer of right upper lobe of lung ([place])   09/06/2020 Cancer Staging   Staging form: Lung, AJCC 8th Edition - Clinical: Stage IVB (cT1, cN3, pM1c) - Signed by Cammie Sickle, MD on 09/06/2020   10/13/2020 -  Chemotherapy    Patient is on Treatment Plan: LUNG CARBOPLATIN / PEMETREXED / PEMBROLIZUMAB Q21D INDUCTION X 4 CYCLES / MAINTENANCE PEMETREXED + PEMBROLIZUMAB        HISTORY OF PRESENTING ILLNESS:  Alejandro Randolph 75 y.o.  male metastatic lung cancer to brain currently s/p [place] Alimta Alejandro Randolph  is here for follow-up for chemotherapy treatment.  Patient follows up with Dr.Brahmanday who is off today, I am covering him to see this patient. Medical record review was performed by me Patient tolerates treatments.  Reports losing hearing of both ears.  Symptoms started after he started chemotherapy, noticed symptoms for the past few weeks.  Denies any tinnitus. .Patient denies any nausea vomiting.  Any headaches.  No skin rash. No chest pain.  Review of Systems  Constitutional: Negative for chills, diaphoresis, fever, malaise/fatigue and weight loss.  HENT: Positive for hearing loss. Negative for nosebleeds and sore throat.   Eyes: Negative for double vision.  Respiratory: Negative for cough, hemoptysis and wheezing.   Cardiovascular: Negative for chest pain, palpitations, orthopnea and leg swelling.  Gastrointestinal: Negative for abdominal pain, blood in stool, constipation, diarrhea, heartburn, melena, nausea and vomiting.  Genitourinary: Negative for dysuria, frequency and urgency.  Musculoskeletal: Positive for back pain. Negative for joint pain.  Skin: Negative for itching and rash.  Neurological: Negative for dizziness, tingling, focal weakness and weakness.  Endo/Heme/Allergies:  Does not bruise/bleed easily.  Psychiatric/Behavioral: Negative for depression. The patient is not nervous/anxious and does not have insomnia.      MEDICAL HISTORY:  Past Medical History:  Diagnosis Date  . Abscess of sigmoid colon   . Cancer [facility])    brain tumor  . Diabetes mellitus without complication ([place])   . GERD (gastroesophageal reflux disease)   . Hypercholesteremia   . Hypertension   . Peripheral vascular disease ([place])   . PUD (peptic ulcer disease)     SURGICAL HISTORY: Past Surgical History:  Procedure Laterality Date  . CHOLECYSTECTOMY    . COLONOSCOPY WITH PROPOFOL N/A 07/19/2016   Procedure: COLONOSCOPY WITH PROPOFOL;  Surgeon: Jonathon Bellows, MD;  Location: [facility] ORS;  Service: Endoscopy;  Laterality: N/A;  . COLONOSCOPY WITH PROPOFOL N/A 09/02/2016   Procedure: COLONOSCOPY WITH PROPOFOL;  Surgeon: Jonathon Bellows, MD;  Location: [facility] ENDOSCOPY;  Service: Endoscopy;  Laterality: N/A;  . COLONOSCOPY WITH PROPOFOL N/A 09/04/2020   Procedure: COLONOSCOPY WITH PROPOFOL;  Surgeon: Jonathon Bellows, MD;  Location: [facility] ENDOSCOPY;  Service: Gastroenterology;  Laterality: N/A;  . femoral stents Bilateral   . FLEXIBLE SIGMOIDOSCOPY  07/19/2016   Procedure: FLEXIBLE SIGMOIDOSCOPY;  Surgeon: Jonathon Bellows, MD;  Location: [facility] ORS;  Service: Endoscopy;;  . HERNIA REPAIR    . IR IMAGING GUIDED PORT INSERTION  10/10/2020  . RETINAL DETACHMENT SURGERY    . TONSILLECTOMY    . VIDEO BRONCHOSCOPY WITH ENDOBRONCHIAL ULTRASOUND N/A 09/01/2020   Procedure: VIDEO BRONCHOSCOPY WITH ENDOBRONCHIAL ULTRASOUND;  Surgeon: Ottie Glazier, MD;  Location: [facility] ORS;  Service: Thoracic;  Laterality: N/A;    SOCIAL HISTORY: Social History   Socioeconomic History  . Marital status: Married    Spouse name: Katharine Look  . Number of children: Not on file  . Years of education: Not on file  . Highest education level: Not on file  Occupational History    Comment: retired  Tobacco Use  . Smoking status: Former Smoker     Quit date: 2001    Years since quitting: 21.3  . Smokeless tobacco: Never Used  Vaping Use  . Vaping Use: Never used  Substance and Sexual Activity  . Alcohol use: No  . Drug use: No  . Sexual activity: Not on file  Other Topics Concern  . Not on file  Social History Narrative   Lives in [place]; lives with wife; 1 son- lives close by.[id number]- Quit smoking- 2001. No Alcohol.    Feels safe in his home.   Social Determinants of Health   Financial Resource Strain: Not on file  Food Insecurity: Not on file  Transportation Needs: Not on file  Physical Activity: Not on file  Stress: Not on file  Social Connections: Not on file  Intimate Partner Violence: Not on file    FAMILY HISTORY: Family History  Problem Relation Age of Onset  . Diabetes Mother   . Hypertension Mother   . Diabetes Father     ALLERGIES:  is allergic to atorvastatin.  MEDICATIONS:  Current Outpatient Medications  Medication Sig Dispense Refill  . amLODipine (NORVASC) 10 MG tablet Take 10 mg by mouth daily.    Marland Kitchen aspirin EC 81 MG tablet Take 81 mg by mouth daily.    . fluticasone (FLONASE) 50 MCG/ACT nasal spray Place 1 spray into both nostrils daily as needed for allergies.    . folic acid (FOLVITE) 1 MG tablet Take 1 tablet (1 mg total) by mouth daily. 90 tablet 1  .  glipiZIDE (GLUCOTROL) 5 MG tablet TAKE 1 TABLET BY MOUTH ONCE A DAY BEFOREBREAKFAST 30 tablet 0  . glucose blood (ONETOUCH ULTRA) test strip USE AS DIRECTED TWICE A DAY TO CHECK BLOOD SUGAR    . lidocaine-prilocaine (EMLA) cream Apply 1 application topically as needed. Apply 30-45 mins prior to port access. 30 g 3  . metFORMIN (GLUCOPHAGE-XR) 500 MG 24 hr tablet Take 500 mg by mouth 2 (two) times daily.    . Omega-3 Fatty Acids (OMEGA-3 FISH OIL) 500 MG CAPS Take 500 mg by mouth daily.    . ondansetron (ZOFRAN) 8 MG tablet One pill every 8 hours as needed for nausea/vomitting. 40 tablet 1  . pantoprazole (PROTONIX) 40 MG tablet Take 40 mg by  mouth daily.    . potassium chloride (KLOR-CON) 10 MEQ tablet Take 1 tablet (10 mEq total) by mouth daily. 21 tablet 0  . ROCKLATAN 0.02-0.005 % SOLN Place 1 drop into the left eye daily.    . rosuvastatin (CRESTOR) 5 MG tablet Take 5 mg by mouth at bedtime.    . Saw Palmetto, Serenoa repens, (SAW PALMETTO PO) Take 450 mg by mouth daily.     No current facility-administered medications for this visit.      Marland Kitchen  PHYSICAL EXAMINATION: ECOG PERFORMANCE STATUS: 1 - Symptomatic but completely ambulatory  Vitals:   12/15/20 0840  BP: 135/70  Pulse: 80  Resp: 18  Temp: 98.7 F (37.1 C)  SpO2: 98%   Filed Weights   12/15/20 0840  Weight: 192 lb 4.8 oz (87.2 kg)    Physical Exam Constitutional:      Comments: Patient is walking independently.  Accompanied by his wife.  HENT:     Head: Normocephalic and atraumatic.     Mouth/Throat:     Pharynx: No oropharyngeal exudate.  Eyes:     Pupils: Pupils are equal, round, and reactive to light.  Cardiovascular:     Rate and Rhythm: Normal rate and regular rhythm.  Pulmonary:     Effort: No respiratory distress.     Breath sounds: No wheezing.     Comments: Decreased breath sounds bilaterally at the bases.  No wheeze or crackles. Abdominal:     General: Bowel sounds are normal. There is no distension.     Palpations: Abdomen is soft. There is no mass.     Tenderness: There is no abdominal tenderness. There is no guarding or rebound.  Musculoskeletal:        General: No tenderness. Normal range of motion.     Cervical back: Normal range of motion and neck supple.  Skin:    General: Skin is warm.  Neurological:     Mental Status: He is alert and oriented to person, place, and time. Mental status is at baseline.  Psychiatric:        Mood and Affect: Affect normal.      LABORATORY DATA:  I have reviewed the data as listed Lab Results  Component Value Date   WBC 4.9 12/15/2020   HGB 11.1 (L) 12/15/2020   HCT 32.8 (L)  12/15/2020   MCV 99.4 12/15/2020   PLT 270 12/15/2020   Recent Labs    11/03/20 0817 11/24/20 0801 12/15/20 0815  NA 139 138 139  K 3.3* 3.3* 3.2*  CL 105 103 105  CO2 24 23 25   GLUCOSE 243* 170* 146*  BUN 12 11 11   CREATININE 1.06 0.97 0.97  CALCIUM 9.3 9.9 10.0  GFRNONAA >60 >60 >60  PROT 6.1* 6.6 6.8  ALBUMIN 3.1* 3.6 3.6  AST 24 25 23   ALT 18 16 16   ALKPHOS 43 40 42  BILITOT 0.5 0.9 0.7    RADIOGRAPHIC STUDIES: I have personally reviewed the radiological images as listed and agreed with the findings in the report. CT Chest W Contrast  Result Date: 12/12/2020 CLINICAL DATA:  Metastatic right lung cancer, assess treatment response EXAM: CT CHEST WITH CONTRAST TECHNIQUE: Multidetector CT imaging of the chest was performed during intravenous contrast administration. CONTRAST:  75m OMNIPAQUE IOHEXOL 300 MG/ML  SOLN COMPARISON:  PET-CT, 08/26/2020, CT chest abdomen pelvis, 08/21/2020 FINDINGS: Cardiovascular: Right chest port catheter. Aortic atherosclerosis. Aortic valve calcifications. Normal heart size. Left and right coronary artery calcifications. Trace pericardial effusion. Mediastinum/Nodes: Interval decrease in size of enlarged subcarinal node, measuring 3.1 x 0.8 cm, previously 3.0 x 1.8 cm when measured similarly (series 2, image 72). Interval resolution of a previously enlarged pretracheal lymph node, measuring no greater than 0.8 x 0.6 cm, previously 2.0 x 1.6 cm (series 2, image 47). Thyroid gland, trachea, and esophagus demonstrate no significant findings. Lungs/Pleura: No significant change in a small subpleural pulmonary nodule of the peripheral posterior right upper lobe measuring 0.7 cm (series 3, image 48). There is a new nodule of the very inferior posterior right upper lobe abutting the major and minor fissures measuring 0.7 cm (series 3, image 76). There are numerous new nodules in the left upper lobe and lingula, for example a 7 mm nodule of the posterior lingula  (series 3, image 78) and a 9 mm nodule of the dependent left lower lobe (series 3, image 93). Mild fibrosis of the bilateral lung bases. No pleural effusion or pneumothorax. Upper Abdomen: No acute abnormality. Redemonstrated benign, non FDG avid right adrenal adenoma. Musculoskeletal: No chest wall mass or suspicious bone lesions identified. IMPRESSION: 1. Significant interval decrease in size of enlarged, FDG avid mediastinal lymph nodes, consistent with treatment response. 2. A previously seen small subpleural nodule of the peripheral posterior right upper lobe is unchanged and remains nonspecific. 3. There are numerous new small pulmonary nodules, the majority present in the dependent left lower lobe. These are nonspecific although distribution generally suggests an infectious or inflammatory etiology. Metastatic disease is however a substantial differential consideration. 4. Coronary artery disease. Aortic Atherosclerosis (ICD10-I70.0). Electronically Signed   By: AEddie CandleM.D.   On: 12/12/2020 09:55   MR Brain W Wo Contrast  Result Date: 11/20/2020 CLINICAL DATA:  Metastatic lung cancer post whole brain radiation EXAM: MRI HEAD WITHOUT AND WITH CONTRAST TECHNIQUE: Multiplanar, multiecho pulse sequences of the brain and surrounding structures were obtained without and with intravenous contrast. CONTRAST:  928mGADAVIST GADOBUTROL 1 MMOL/ML IV SOLN COMPARISON:  08/18/2020 FINDINGS: Brain: Decrease in size of metastatic lesions. Representative examples include left cerebellar metastasis on series 18, image 43 measuring 1.5 cm (previously 2.1 cm); right temporal lesion on image 78 measuring 1.2 cm (previously 2.3 cm); left lateral occipital lesion on image 76 measuring 1.4 cm (previously 2.4 cm). Associated edema is also decreased. No new lesion. No acute infarction. No hydrocephalus. Prominence of the ventricles and sulci reflects stable parenchymal volume loss. Patchy T2 hyperintensity in the  supratentorial white matter addition to residual edema is nonspecific but probably reflects stable chronic microvascular ischemic changes. Vascular: Major vessel flow voids at the skull base are preserved. Skull and upper cervical spine: Normal marrow signal is preserved. Sinuses/Orbits: Minor mucosal thickening. Bilateral lens replacements. Left scleral buckle. Other: Sella is  unremarkable. Persistent bilateral mastoid effusions IMPRESSION: Significant improvement in size of metastatic lesions and associated edema reflecting treatment response. Electronically Signed   By: Macy Mis M.D.   On: 11/20/2020 15:07    ASSESSMENT & PLAN:  1. Primary cancer of right upper lobe of lung ([place])   2. Brain metastasis ([place])   3. Encounter for antineoplastic chemotherapy   4. Anemia due to antineoplastic chemotherapy   5. Bilateral hearing loss, unspecified hearing loss type    #Primary right lung adenocarcinoma Currently on palliative chemotherapy carboplatin, Alimta, Keytruda. Labs reviewed and discussed with patient. Proceed with cycle 4 chemotherapy carboplatin, Alimta, Keytruda today. 12/11/2020, interval CT chest with contrast showed significant interval decrease in size of enlarged FDG avid mediastinal lymph nodes, consistent with treatment response.  Previously seen small subpleural nodule is unchanged and remain nonspecific.  Numerous new small pulmonary nodules, majority presents in the left lower lobe.  Nonspecific.  Distribution suggest an infectious or inflammatory etiology.  Cannot rule out metastasis.   #Brain metastasis, improved on recent brain MRI.  Off Keppra. #Plan to switch to Keytruda maintenance at the next visit.-CC Dr. Rogue Bussing.  #Hypokalemia, chronic, recommend patient to start potassium chloride 10 mill equivalent daily.  I will give him 3-week supply. Encourage potassium enriched food. #diabetes.  Glucoses 146 today.  Monitor for now.  Continue metformin and  glipizide. #Hearing loss, ?  Carboplatin side effect.  I recommend patient to have hearing testing.  Offered him to be referred to ENT.  Patient prefers to talk to his [place] primary care provider and then decide.  #Anemia, hemoglobin 11.1, likely secondary to chemotherapy.  Continue to monitor.  All questions were answered. The patient knows to call the clinic with any problems, questions or concerns.    Earlie Server, MD 12/15/2020 7:23 PM

## 2020-12-15 NOTE — Patient Instructions (Signed)
[facility] [place] [facility]  Discharge Instructions: Thank you for choosing [facility] to provide your oncology and hematology care.  If you have a lab appointment with the Cancer Center, please go directly to the Cancer Center and check in at the registration area.  Wear comfortable clothing and clothing appropriate for easy access to any Portacath or PICC line.   We strive to give you quality time with your provider. You may need to reschedule your appointment if you arrive late (15 or more minutes).  Arriving late affects you and other patients whose appointments are after yours.  Also, if you miss three or more appointments without notifying the office, you may be dismissed from the clinic at the provider's discretion.      For prescription refill requests, have your pharmacy contact our office and allow 72 hours for refills to be completed.      To help prevent nausea and vomiting after your treatment, we encourage you to take your nausea medication as directed.  BELOW ARE SYMPTOMS THAT SHOULD BE REPORTED IMMEDIATELY: *FEVER GREATER THAN 100.4 F (38 C) OR HIGHER *CHILLS OR SWEATING *NAUSEA AND VOMITING THAT IS NOT CONTROLLED WITH YOUR NAUSEA MEDICATION *UNUSUAL SHORTNESS OF BREATH *UNUSUAL BRUISING OR BLEEDING *URINARY PROBLEMS (pain or burning when urinating, or frequent urination) *BOWEL PROBLEMS (unusual diarrhea, constipation, pain near the anus) TENDERNESS IN MOUTH AND THROAT WITH OR WITHOUT PRESENCE OF ULCERS (sore throat, sores in mouth, or a toothache) UNUSUAL RASH, SWELLING OR PAIN  UNUSUAL VAGINAL DISCHARGE OR ITCHING   Items with * indicate a potential emergency and should be followed up as soon as possible or go to the Emergency Department if any problems should occur.  Please show the CHEMOTHERAPY ALERT CARD or IMMUNOTHERAPY ALERT CARD at check-in to the Emergency Department and triage nurse.  Should you have questions after your  visit or need to cancel or reschedule your appointment, please contact [facility] [place] [facility]  [phone] and follow the prompts.  Office hours are 8:00 a.m. to 4:30 p.m. Monday - Friday. Please note that voicemails left after 4:00 p.m. may not be returned until the following business day.  We are closed weekends and major holidays. You have access to a nurse at all times for urgent questions. Please call the main number to the clinic [phone] and follow the prompts.  For any non-urgent questions, you may also contact your provider using MyChart. We now offer e-Visits for anyone 18 and older to request care online for non-urgent symptoms. For details visit [email or web address].   Also download the MyChart app! Go to the app store, search "MyChart", open the app, select [facility], and log in with your MyChart username and password.  Due to Covid, a mask is required upon entering the hospital/clinic. If you do not have a mask, one will be given to you upon arrival. For doctor visits, patients may have 1 support person aged 18 or older with them. For treatment visits, patients cannot have anyone with them due to current Covid guidelines and our immunocompromised population.  

## 2020-12-29 ENCOUNTER — Inpatient Hospital Stay
Admission: EM | Admit: 2020-12-29 | Discharge: 2020-12-31 | DRG: 070 | Disposition: E | Payer: Medicare PPO | Attending: Internal Medicine | Admitting: Internal Medicine

## 2020-12-29 ENCOUNTER — Emergency Department: Payer: Medicare PPO

## 2020-12-29 ENCOUNTER — Other Ambulatory Visit: Payer: Self-pay

## 2020-12-29 DIAGNOSIS — R4182 Altered mental status, unspecified: Secondary | ICD-10-CM | POA: Diagnosis not present

## 2020-12-29 DIAGNOSIS — R509 Fever, unspecified: Secondary | ICD-10-CM | POA: Diagnosis not present

## 2020-12-29 DIAGNOSIS — K219 Gastro-esophageal reflux disease without esophagitis: Secondary | ICD-10-CM | POA: Diagnosis present

## 2020-12-29 DIAGNOSIS — W19XXXA Unspecified fall, initial encounter: Secondary | ICD-10-CM | POA: Diagnosis present

## 2020-12-29 DIAGNOSIS — E7800 Pure hypercholesterolemia, unspecified: Secondary | ICD-10-CM | POA: Diagnosis present

## 2020-12-29 DIAGNOSIS — I4891 Unspecified atrial fibrillation: Secondary | ICD-10-CM | POA: Diagnosis present

## 2020-12-29 DIAGNOSIS — Z20822 Contact with and (suspected) exposure to covid-19: Secondary | ICD-10-CM | POA: Diagnosis present

## 2020-12-29 DIAGNOSIS — G9340 Encephalopathy, unspecified: Secondary | ICD-10-CM

## 2020-12-29 DIAGNOSIS — G9341 Metabolic encephalopathy: Secondary | ICD-10-CM | POA: Diagnosis not present

## 2020-12-29 DIAGNOSIS — Y92009 Unspecified place in unspecified non-institutional (private) residence as the place of occurrence of the external cause: Secondary | ICD-10-CM

## 2020-12-29 DIAGNOSIS — C799 Secondary malignant neoplasm of unspecified site: Secondary | ICD-10-CM

## 2020-12-29 DIAGNOSIS — Z9049 Acquired absence of other specified parts of digestive tract: Secondary | ICD-10-CM

## 2020-12-29 DIAGNOSIS — Z515 Encounter for palliative care: Secondary | ICD-10-CM

## 2020-12-29 DIAGNOSIS — T451X5A Adverse effect of antineoplastic and immunosuppressive drugs, initial encounter: Secondary | ICD-10-CM | POA: Diagnosis present

## 2020-12-29 DIAGNOSIS — J9601 Acute respiratory failure with hypoxia: Secondary | ICD-10-CM | POA: Diagnosis present

## 2020-12-29 DIAGNOSIS — E1151 Type 2 diabetes mellitus with diabetic peripheral angiopathy without gangrene: Secondary | ICD-10-CM | POA: Diagnosis present

## 2020-12-29 DIAGNOSIS — G936 Cerebral edema: Secondary | ICD-10-CM

## 2020-12-29 DIAGNOSIS — Z87891 Personal history of nicotine dependence: Secondary | ICD-10-CM

## 2020-12-29 DIAGNOSIS — I10 Essential (primary) hypertension: Secondary | ICD-10-CM | POA: Diagnosis present

## 2020-12-29 DIAGNOSIS — C7931 Secondary malignant neoplasm of brain: Secondary | ICD-10-CM | POA: Diagnosis present

## 2020-12-29 DIAGNOSIS — Z833 Family history of diabetes mellitus: Secondary | ICD-10-CM

## 2020-12-29 DIAGNOSIS — Z66 Do not resuscitate: Secondary | ICD-10-CM | POA: Diagnosis not present

## 2020-12-29 DIAGNOSIS — I472 Ventricular tachycardia: Secondary | ICD-10-CM | POA: Diagnosis present

## 2020-12-29 DIAGNOSIS — D696 Thrombocytopenia, unspecified: Secondary | ICD-10-CM

## 2020-12-29 DIAGNOSIS — C3411 Malignant neoplasm of upper lobe, right bronchus or lung: Secondary | ICD-10-CM | POA: Diagnosis present

## 2020-12-29 DIAGNOSIS — C3490 Malignant neoplasm of unspecified part of unspecified bronchus or lung: Secondary | ICD-10-CM | POA: Diagnosis present

## 2020-12-29 DIAGNOSIS — E119 Type 2 diabetes mellitus without complications: Secondary | ICD-10-CM

## 2020-12-29 DIAGNOSIS — Z8711 Personal history of peptic ulcer disease: Secondary | ICD-10-CM

## 2020-12-29 DIAGNOSIS — G932 Benign intracranial hypertension: Secondary | ICD-10-CM | POA: Diagnosis present

## 2020-12-29 DIAGNOSIS — D6959 Other secondary thrombocytopenia: Secondary | ICD-10-CM | POA: Diagnosis present

## 2020-12-29 DIAGNOSIS — Z8249 Family history of ischemic heart disease and other diseases of the circulatory system: Secondary | ICD-10-CM

## 2020-12-29 DIAGNOSIS — R410 Disorientation, unspecified: Secondary | ICD-10-CM

## 2020-12-29 DIAGNOSIS — E785 Hyperlipidemia, unspecified: Secondary | ICD-10-CM | POA: Diagnosis present

## 2020-12-29 LAB — CBC WITH DIFFERENTIAL/PLATELET
Abs Immature Granulocytes: 0.02 10*3/uL (ref 0.00–0.07)
Basophils Absolute: 0 10*3/uL (ref 0.0–0.1)
Basophils Relative: 0 %
Eosinophils Absolute: 0.1 10*3/uL (ref 0.0–0.5)
Eosinophils Relative: 2 %
HCT: 31.5 % — ABNORMAL LOW (ref 39.0–52.0)
Hemoglobin: 10.9 g/dL — ABNORMAL LOW (ref 13.0–17.0)
Immature Granulocytes: 1 %
Lymphocytes Relative: 25 %
Lymphs Abs: 1.1 10*3/uL (ref 0.7–4.0)
MCH: 33.5 pg (ref 26.0–34.0)
MCHC: 34.6 g/dL (ref 30.0–36.0)
MCV: 96.9 fL (ref 80.0–100.0)
Monocytes Absolute: 1 10*3/uL (ref 0.1–1.0)
Monocytes Relative: 22 %
Neutro Abs: 2.2 10*3/uL (ref 1.7–7.7)
Neutrophils Relative %: 50 %
Platelets: 98 10*3/uL — ABNORMAL LOW (ref 150–400)
RBC: 3.25 MIL/uL — ABNORMAL LOW (ref 4.22–5.81)
RDW: 15.3 % (ref 11.5–15.5)
WBC: 4.4 10*3/uL (ref 4.0–10.5)
nRBC: 0 % (ref 0.0–0.2)

## 2020-12-29 LAB — COMPREHENSIVE METABOLIC PANEL
ALT: 21 U/L (ref 0–44)
AST: 24 U/L (ref 15–41)
Albumin: 4.1 g/dL (ref 3.5–5.0)
Alkaline Phosphatase: 49 U/L (ref 38–126)
Anion gap: 13 (ref 5–15)
BUN: 13 mg/dL (ref 8–23)
CO2: 22 mmol/L (ref 22–32)
Calcium: 10.3 mg/dL (ref 8.9–10.3)
Chloride: 103 mmol/L (ref 98–111)
Creatinine, Ser: 1.07 mg/dL (ref 0.61–1.24)
GFR, Estimated: >60 mL/min (ref >60)
Glucose, Bld: 153 mg/dL — ABNORMAL HIGH (ref 70–99)
Potassium: 3.7 mmol/L (ref 3.5–5.1)
Sodium: 138 mmol/L (ref 135–145)
Total Bilirubin: 0.8 mg/dL (ref 0.3–1.2)
Total Protein: 7.2 g/dL (ref 6.5–8.1)

## 2020-12-29 LAB — URINALYSIS, COMPLETE (UACMP) WITH MICROSCOPIC
Bacteria, UA: NONE SEEN
Bilirubin Urine: NEGATIVE
Glucose, UA: NEGATIVE mg/dL
Ketones, ur: 20 mg/dL — AB
Leukocytes,Ua: NEGATIVE
Nitrite: NEGATIVE
Protein, ur: 100 mg/dL — AB
Specific Gravity, Urine: 1.014 (ref 1.005–1.030)
pH: 5 (ref 5.0–8.0)

## 2020-12-29 LAB — RESP PANEL BY RT-PCR (FLU A&B, COVID) ARPGX2
Influenza A by PCR: NEGATIVE
Influenza B by PCR: NEGATIVE
SARS Coronavirus 2 by RT PCR: NEGATIVE

## 2020-12-29 LAB — TROPONIN I (HIGH SENSITIVITY)
Troponin I (High Sensitivity): 8 ng/L (ref <18)
Troponin I (High Sensitivity): 9 ng/L (ref <18)

## 2020-12-29 IMAGING — CT CT HEAD W/O CM
3 of 4 series · 16 of 47 positions shown, 19 images · non-contrast
Comparison: [DATE]

CLINICAL DATA: Stage IV lung cancer with intracranial metastases,
fell

EXAM:
CT HEAD WITHOUT CONTRAST
TECHNIQUE: Contiguous axial images were obtained from the base of the skull
through the vertex without intravenous contrast.

[Series 3: head wo · axial · 0.45mm/px · z∈[+1251,+1416]mm · 10 of 37 slices shown, 13 images]
[im 2/37  brain]
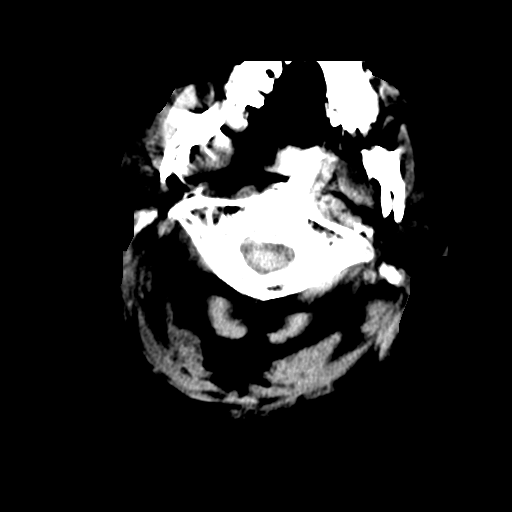
[im 2/37  bone]
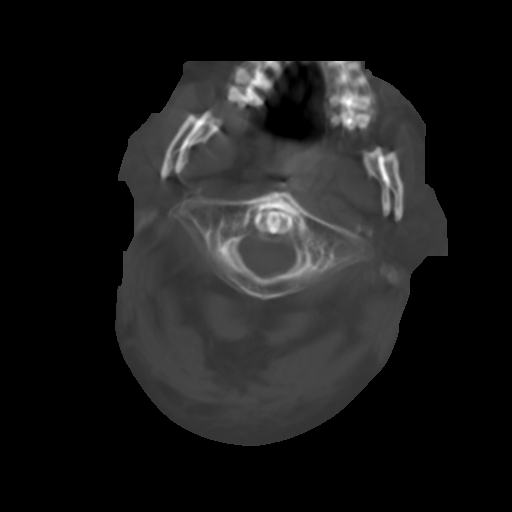
[im 6/37  brain]
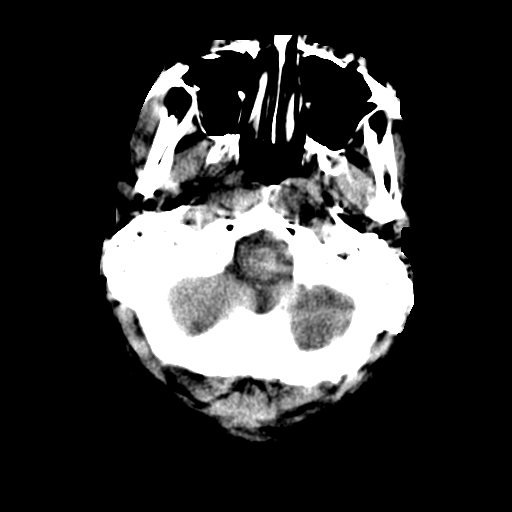
[im 10/37  brain]
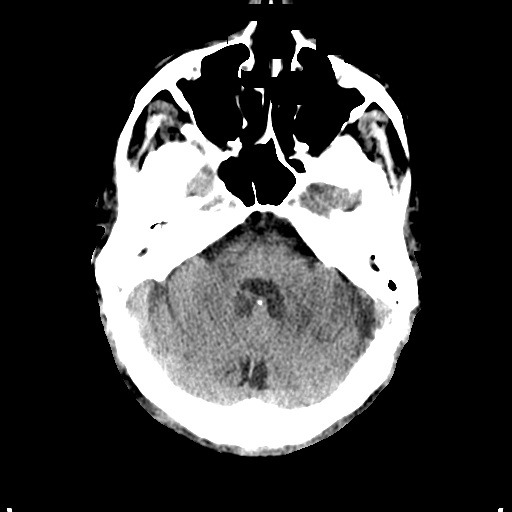
[im 13/37  brain]
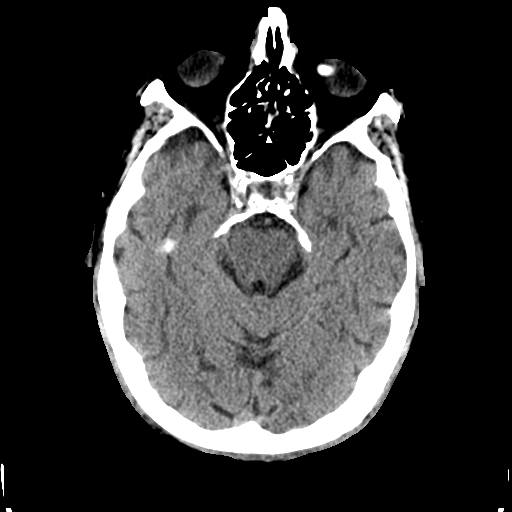
[im 17/37  brain]
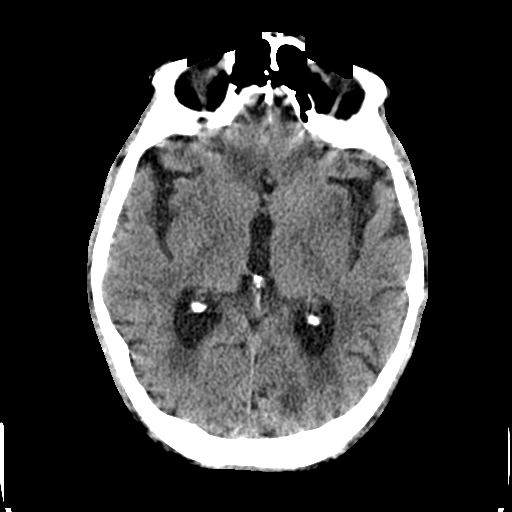
[im 17/37  bone]
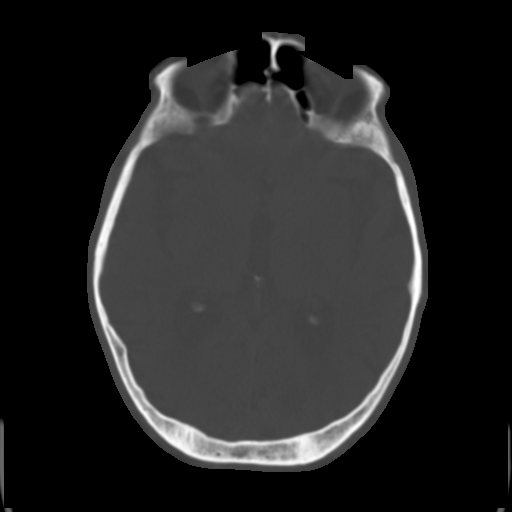
[im 20/37  brain]
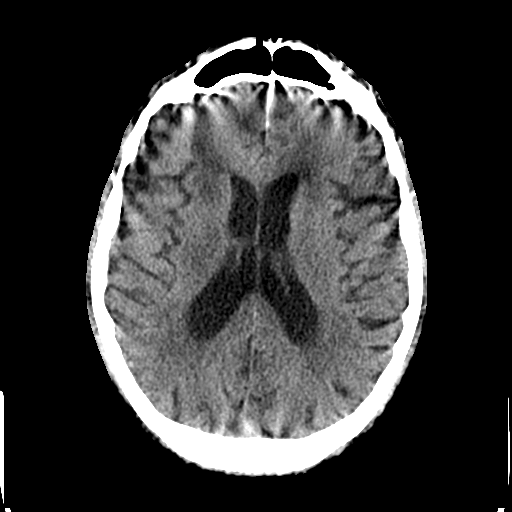
[im 24/37  brain]
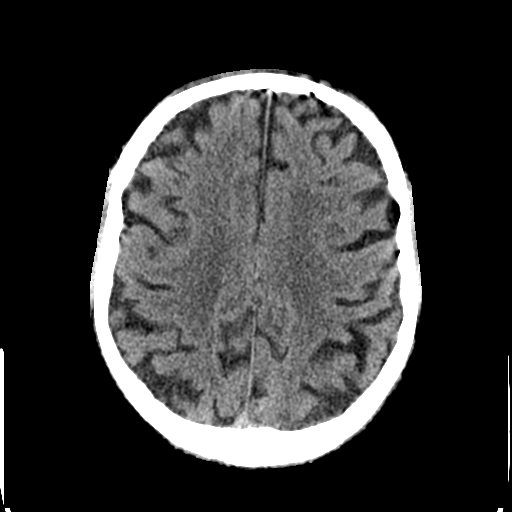
[im 28/37  brain]
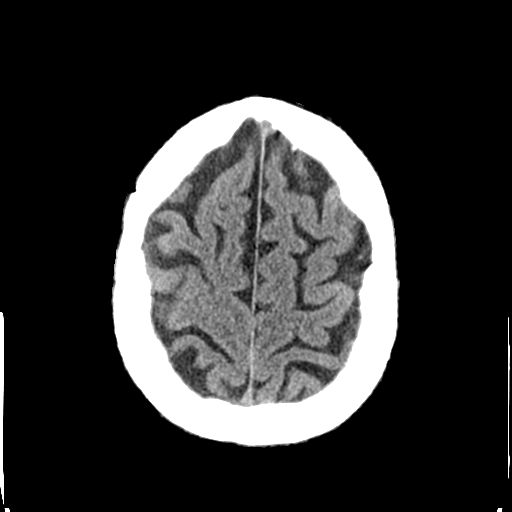
[im 31/37  brain]
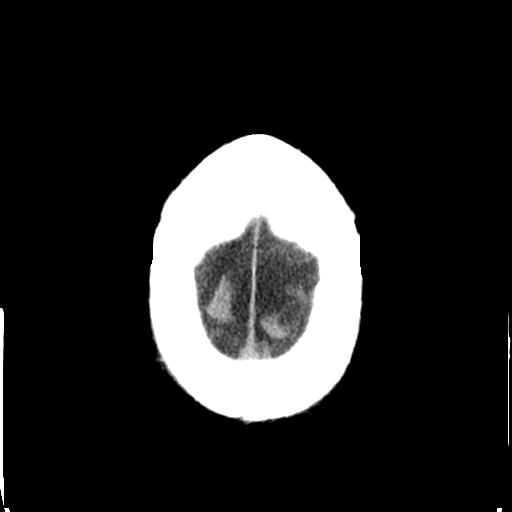
[im 31/37  bone]
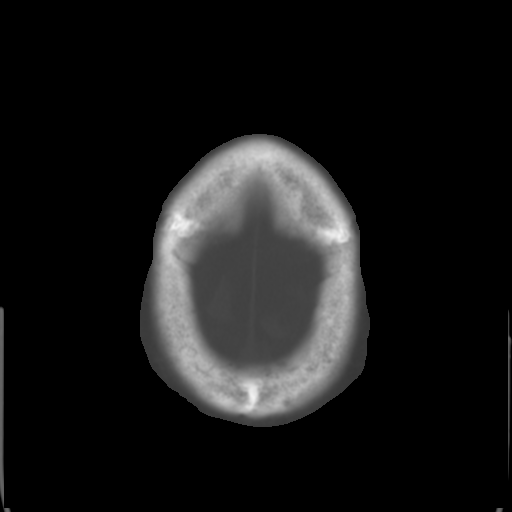
[im 35/37  brain]
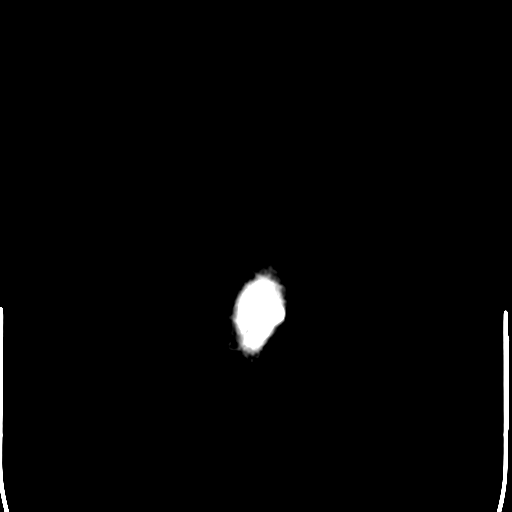

[Series 4: coronal soft tissue · coronal · 0.37mm/px · 3 of 81 slices shown]
[im 27/81  brain]
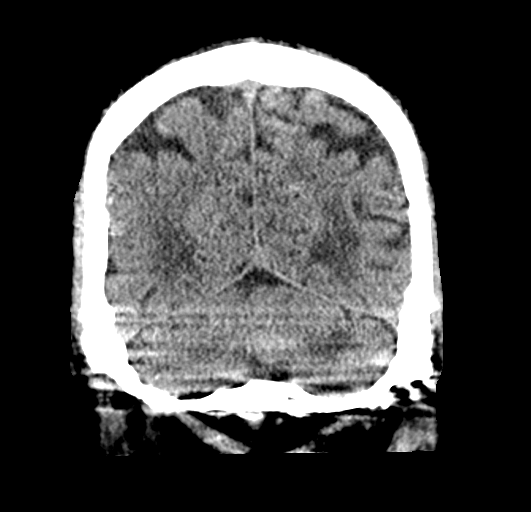
[im 36/81  brain]
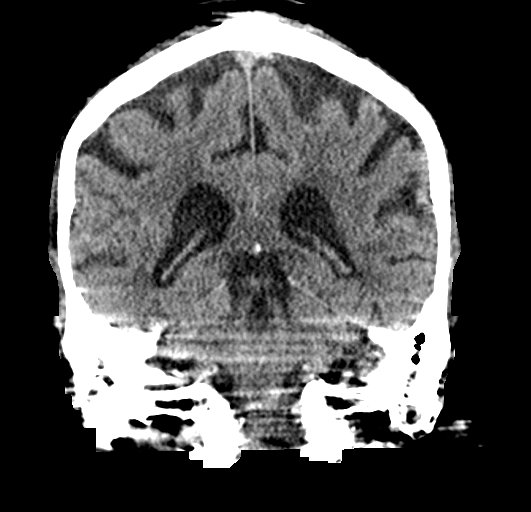
[im 45/81  brain]
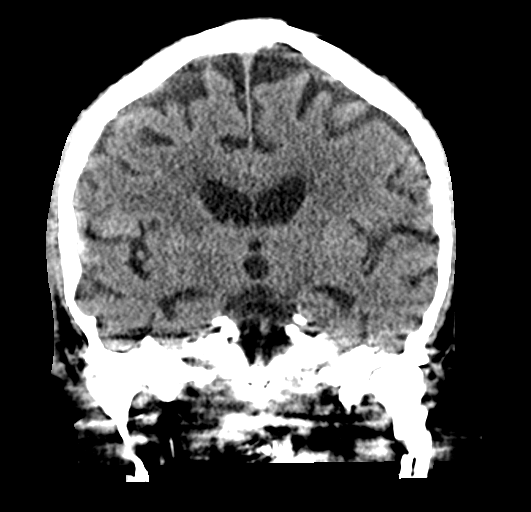

[Series 5: sagittal soft tissue · sagittal · 0.37mm/px · 3 of 64 slices shown]
[im 22/64  brain]
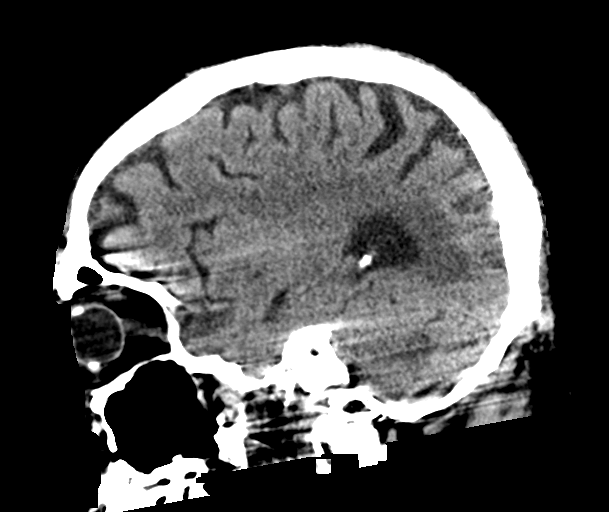
[im 32/64  brain]
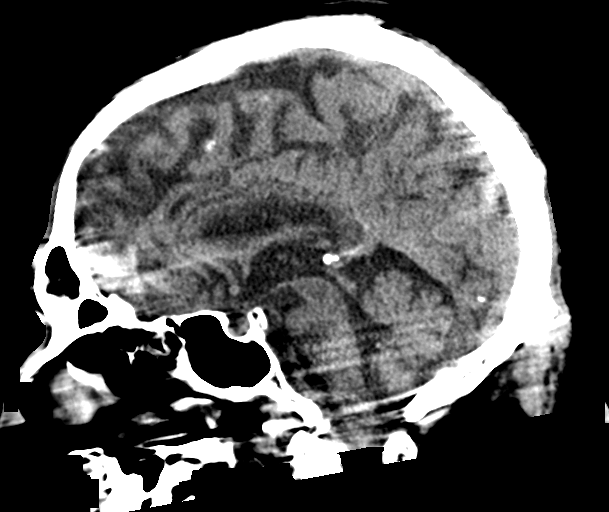
[im 43/64  brain]
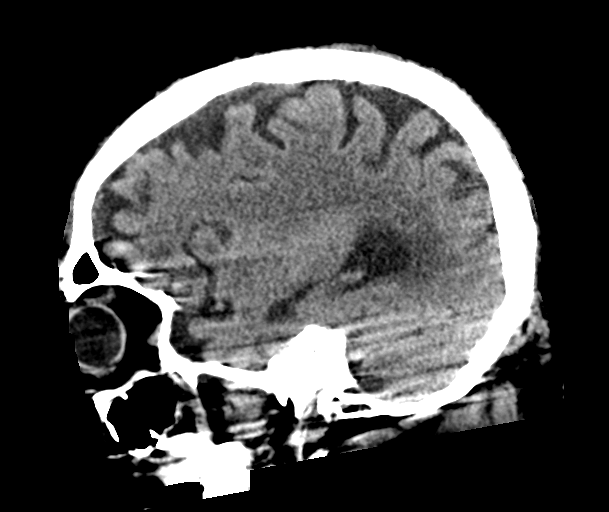

[16 of 47 positions shown; findings below may reference images not displayed]

FINDINGS: Brain: Patient motion throughout the exam limits evaluation.
Hypodensities within the left cerebellar hemisphere, right temporal
lobe, left occipital lobe correspond areas of vasogenic edema seen
on recent MRI, compatible with known intracranial metastases. No
signs of acute infarct or hemorrhage. The lateral ventricles and
midline structures are unremarkable. No acute extra-axial fluid
collections.

Vascular: No hyperdense vessel or unexpected calcification.

Skull: Normal. Negative for fracture or focal lesion.

Sinuses/Orbits: Chronic bilateral mastoid effusions. The paranasal
sinuses are unremarkable.

Other: None.
IMPRESSION: 1. Limited study due to patient motion.
2. No acute infarct or hemorrhage.
3. Stable areas of vasogenic edema within the left cerebellar
hemisphere, right temporal lobe, and left occipital lobe, compatible
with known intracranial metastases. No significant change since
recent MRI.

## 2020-12-29 IMAGING — CT CT CERVICAL SPINE W/O CM
3 of 5 series · 12 of 33 positions shown, 14 images · non-contrast
Comparison: None.

CLINICAL DATA: Stage IV lung cancer with intracranial metastases,
fell

EXAM:
CT CERVICAL SPINE WITHOUT CONTRAST
TECHNIQUE: Multidetector CT imaging of the cervical spine was performed without
intravenous contrast. Multiplanar CT image reconstructions were also
generated.

[Series 6: orthogonal bone · axial · 0.32mm/px · z∈[+1069,+1200]mm · 4 of 120 slices shown, 5 images]
[im 24/120  soft-tissue]
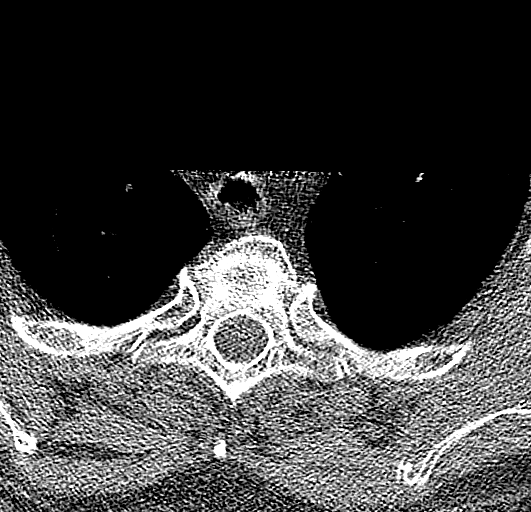
[im 24/120  bone]
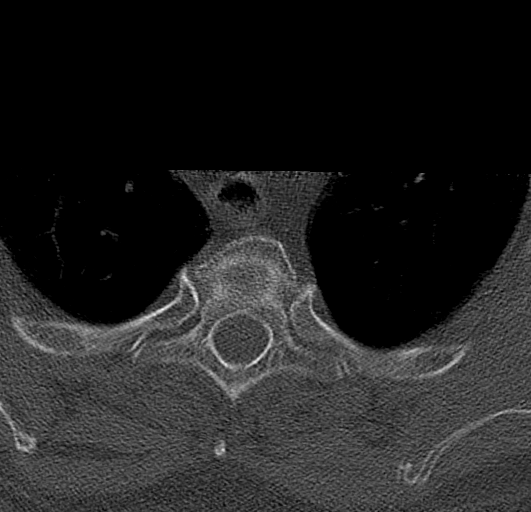
[im 48/120  bone]
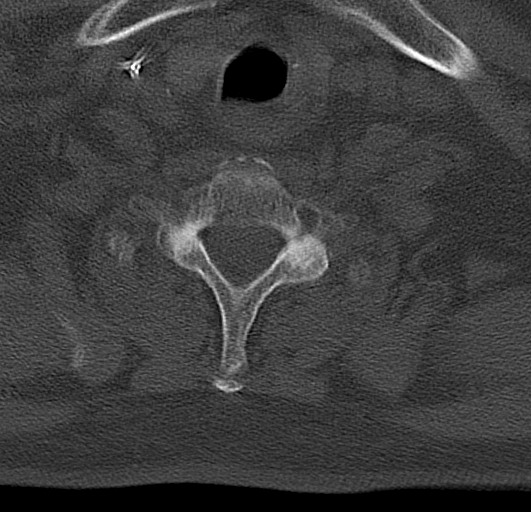
[im 72/120  bone]
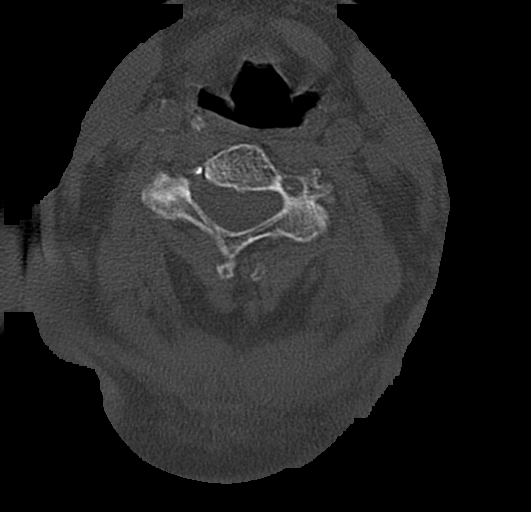
[im 96/120  bone]
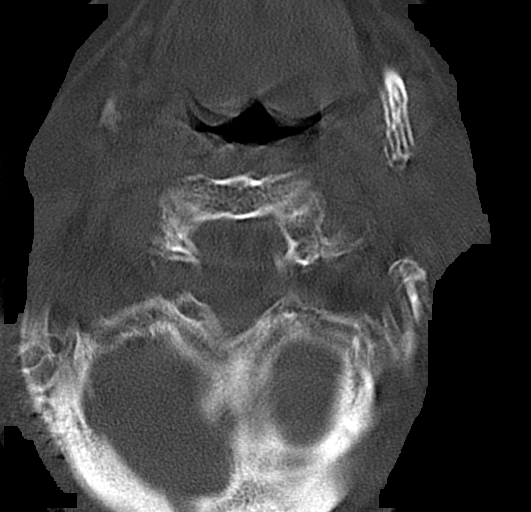

[Series 7: sagittal bone · sagittal · 0.34mm/px · 5 of 81 slices shown, 6 images]
[im 27/81  bone]
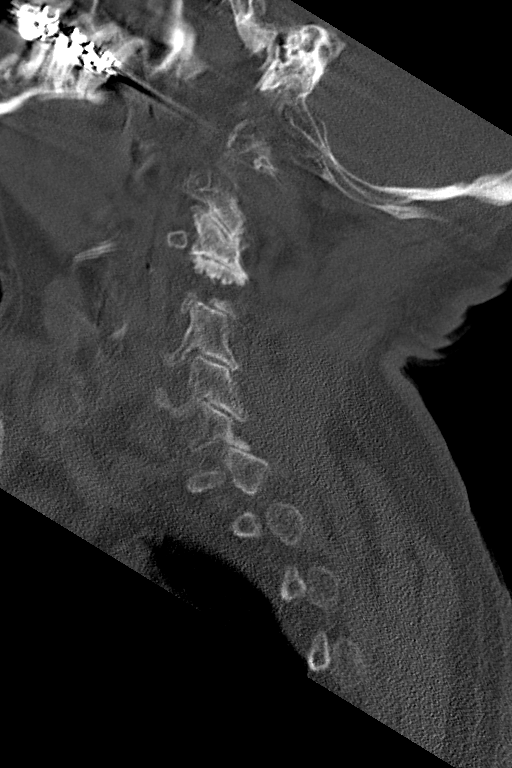
[im 34/81  bone]
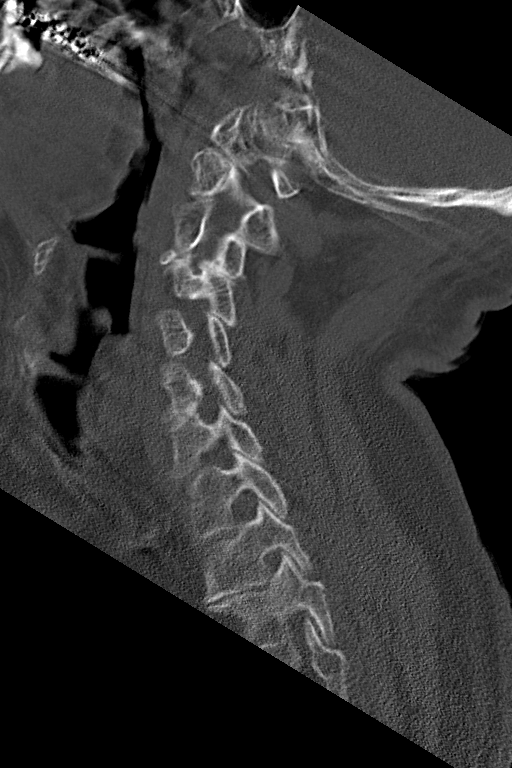
[im 41/81  soft-tissue]
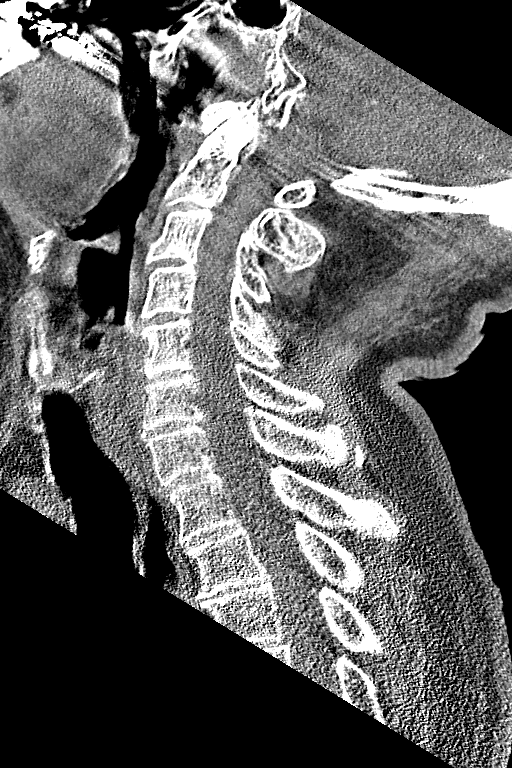
[im 41/81  bone]
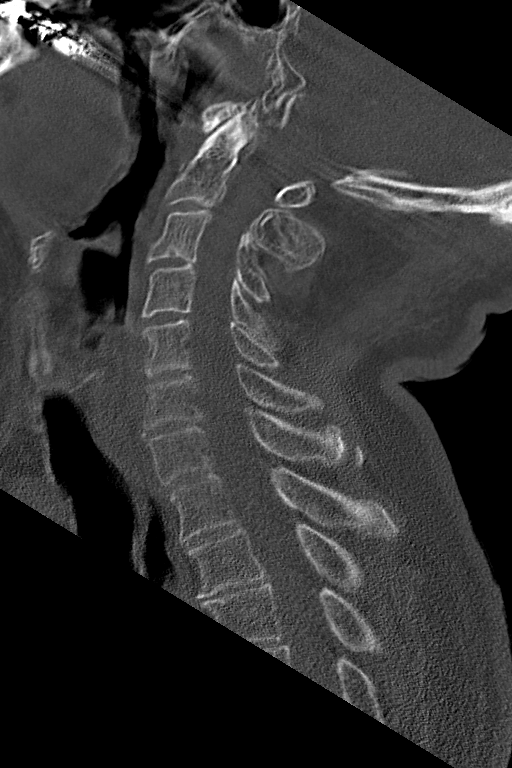
[im 47/81  bone]
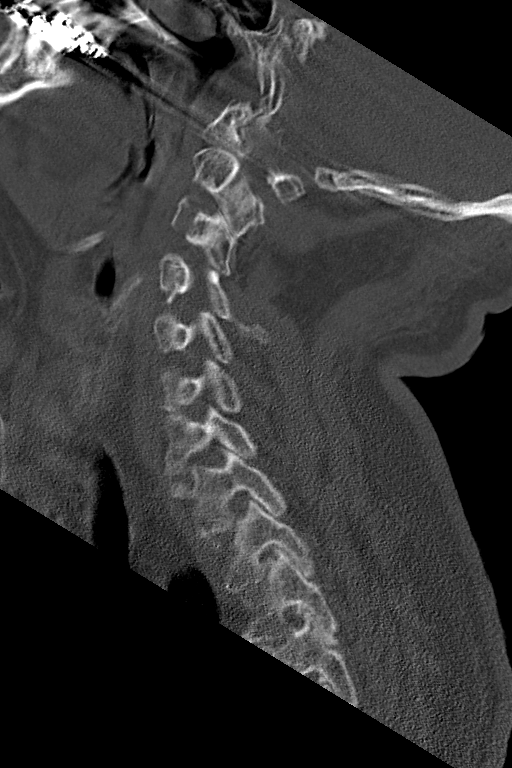
[im 54/81  bone]
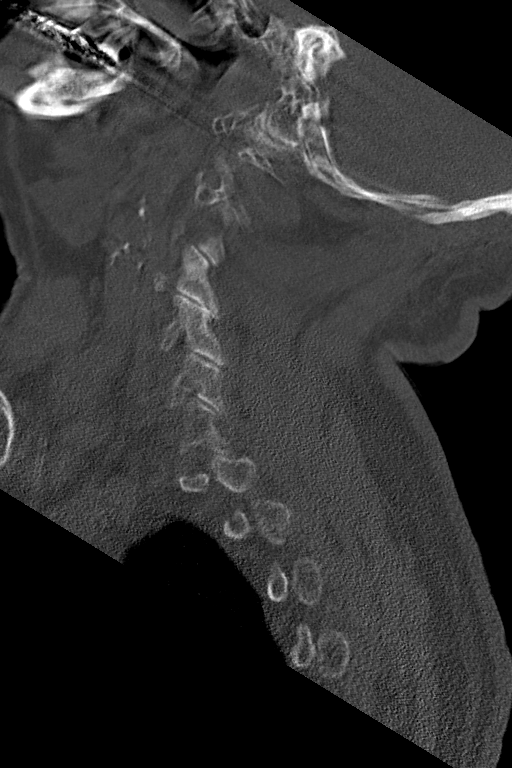

[Series 8: coronal bone · coronal · 0.33mm/px · 3 of 74 slices shown]
[im 22/74  bone]
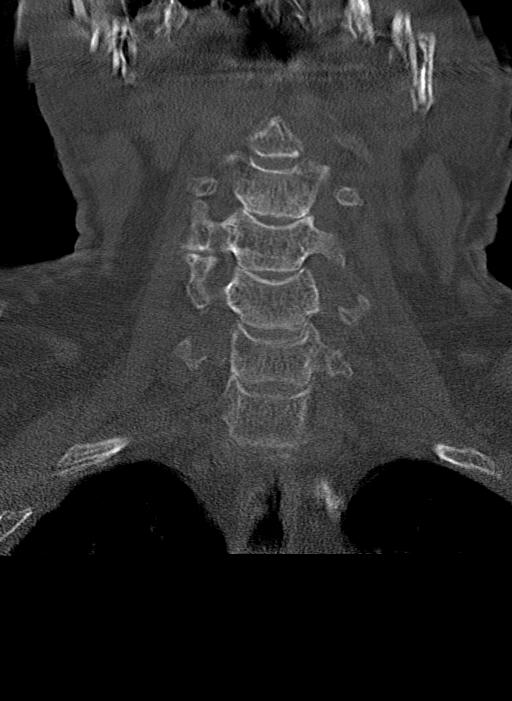
[im 32/74  bone]
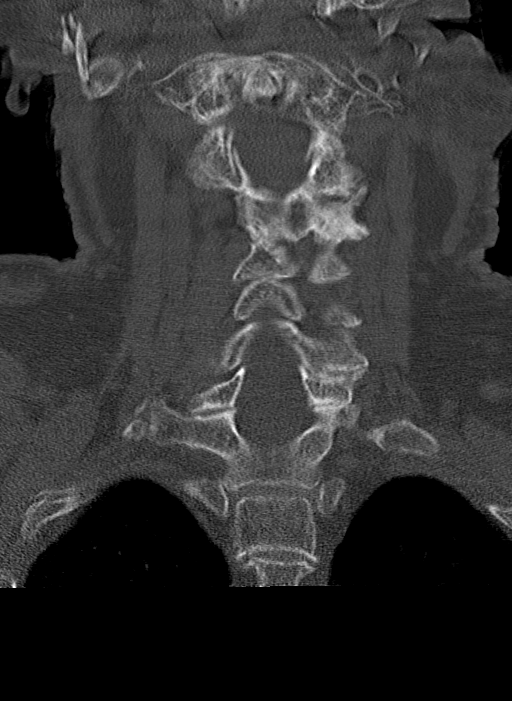
[im 42/74  bone]
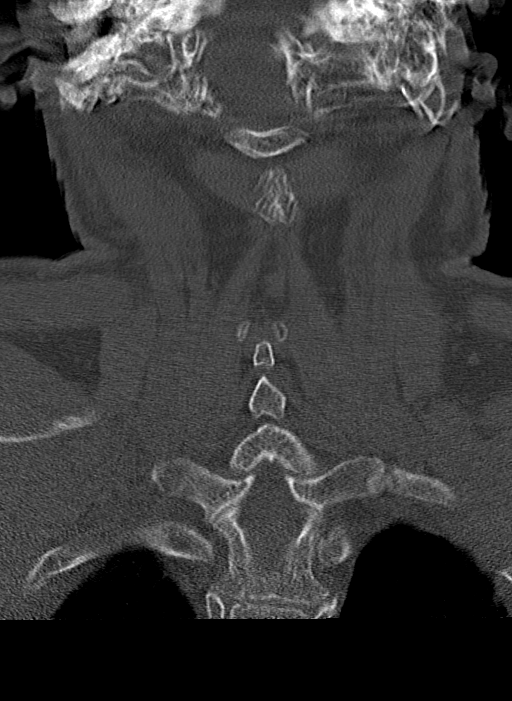

[12 of 33 positions shown; findings below may reference images not displayed]

FINDINGS: Alignment: Alignment is grossly anatomic.

Skull base and vertebrae: Evaluation of the cervical vertebral
bodies is severely limited due to patient motion, especially from
the skull base through the C2 level. I do not see any acute or
destructive bony lesions.

Soft tissues and spinal canal: No prevertebral fluid or swelling. No
visible canal hematoma.

Disc levels: Prominent facet hypertrophic changes at C2-3 and C3-4,
left predominant. Mild lower cervical disc space narrowing.

Upper chest: Central airway is patent.  Lung apices are clear.

Other: Reconstructed images demonstrate no additional findings.
IMPRESSION: 1. Limited evaluation of the upper cervical spine and skull base due
to extensive patient motion.
2. No acute displaced fractures.
3. Mild cervical degenerative changes as above.

## 2020-12-29 IMAGING — DX DG CHEST 1V PORT
2 series · 2 of 2 positions shown · non-contrast
Comparison: Chest CT dated [DATE].

CLINICAL DATA: 74-year-old male with altered mental status.

EXAM:
PORTABLE CHEST 1 VIEW

[chest ap (1 of 2)]
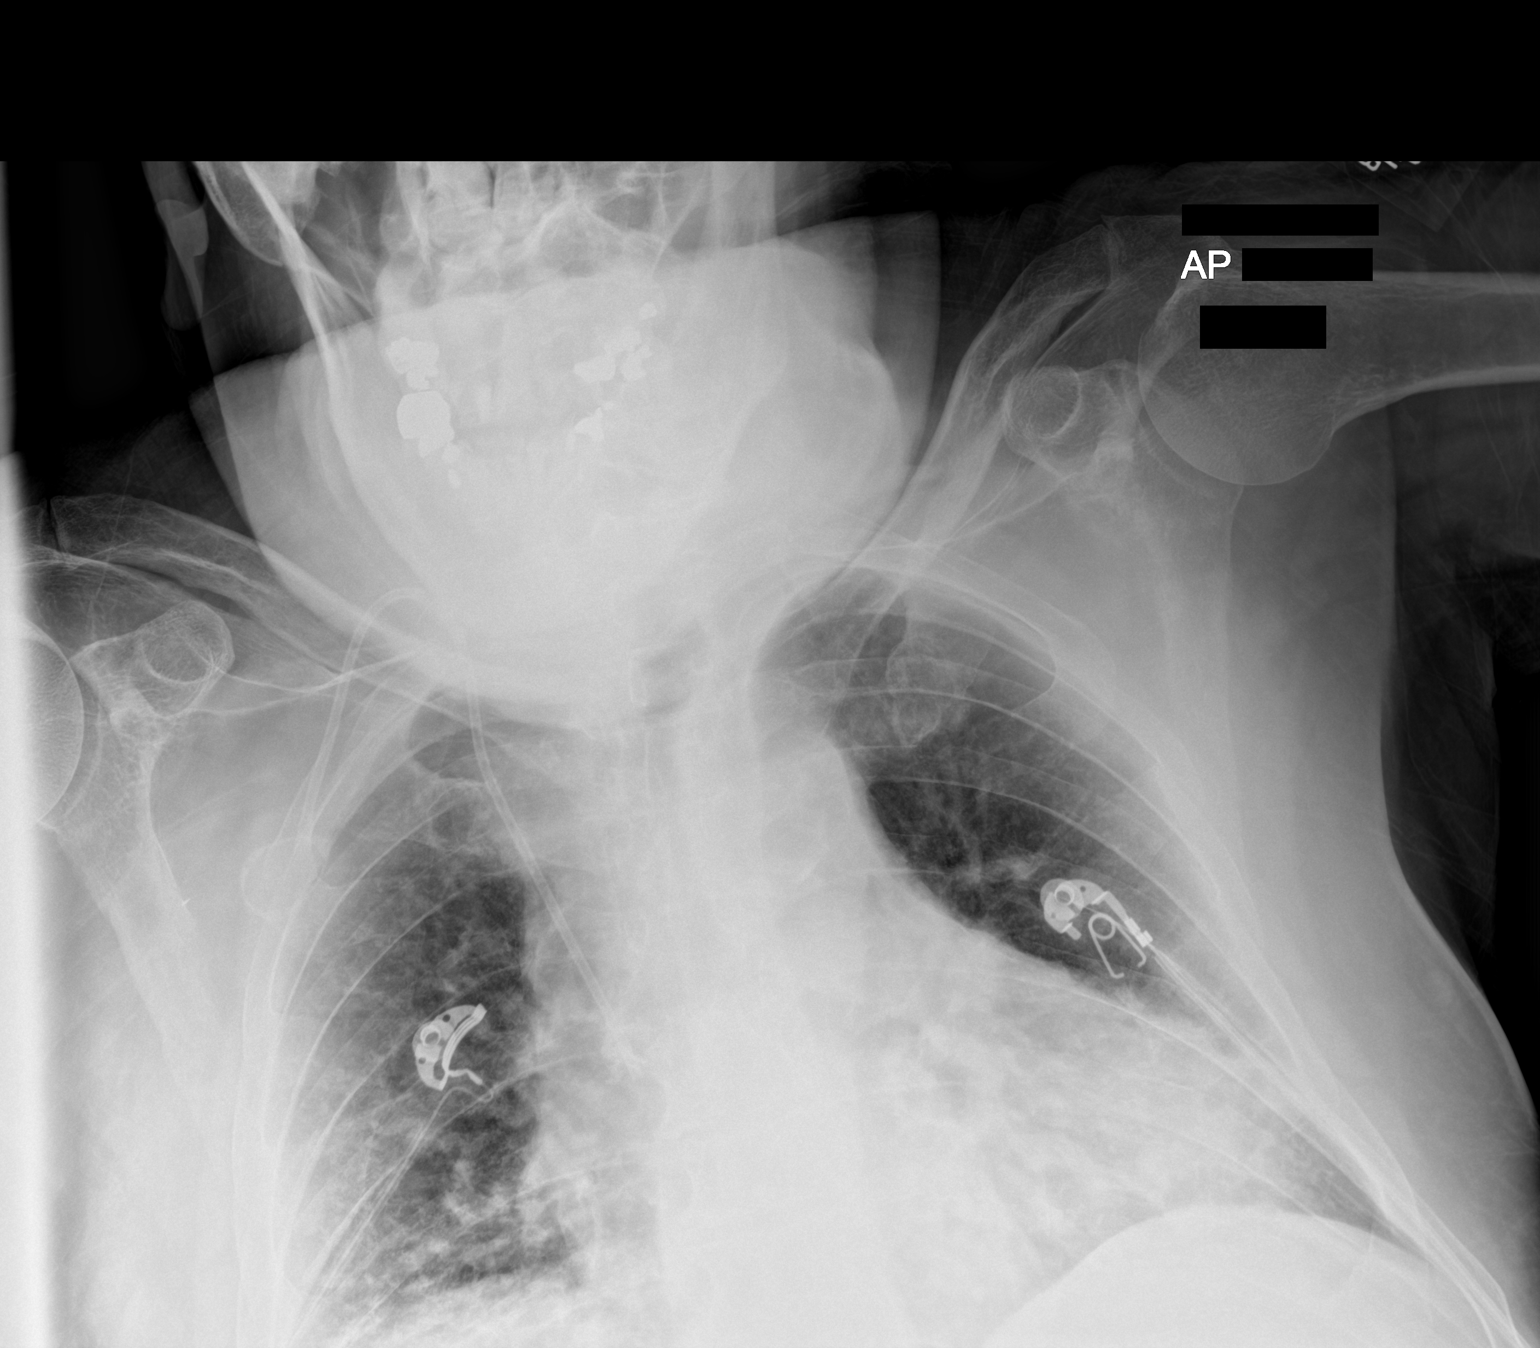

[chest ap (2 of 2)]
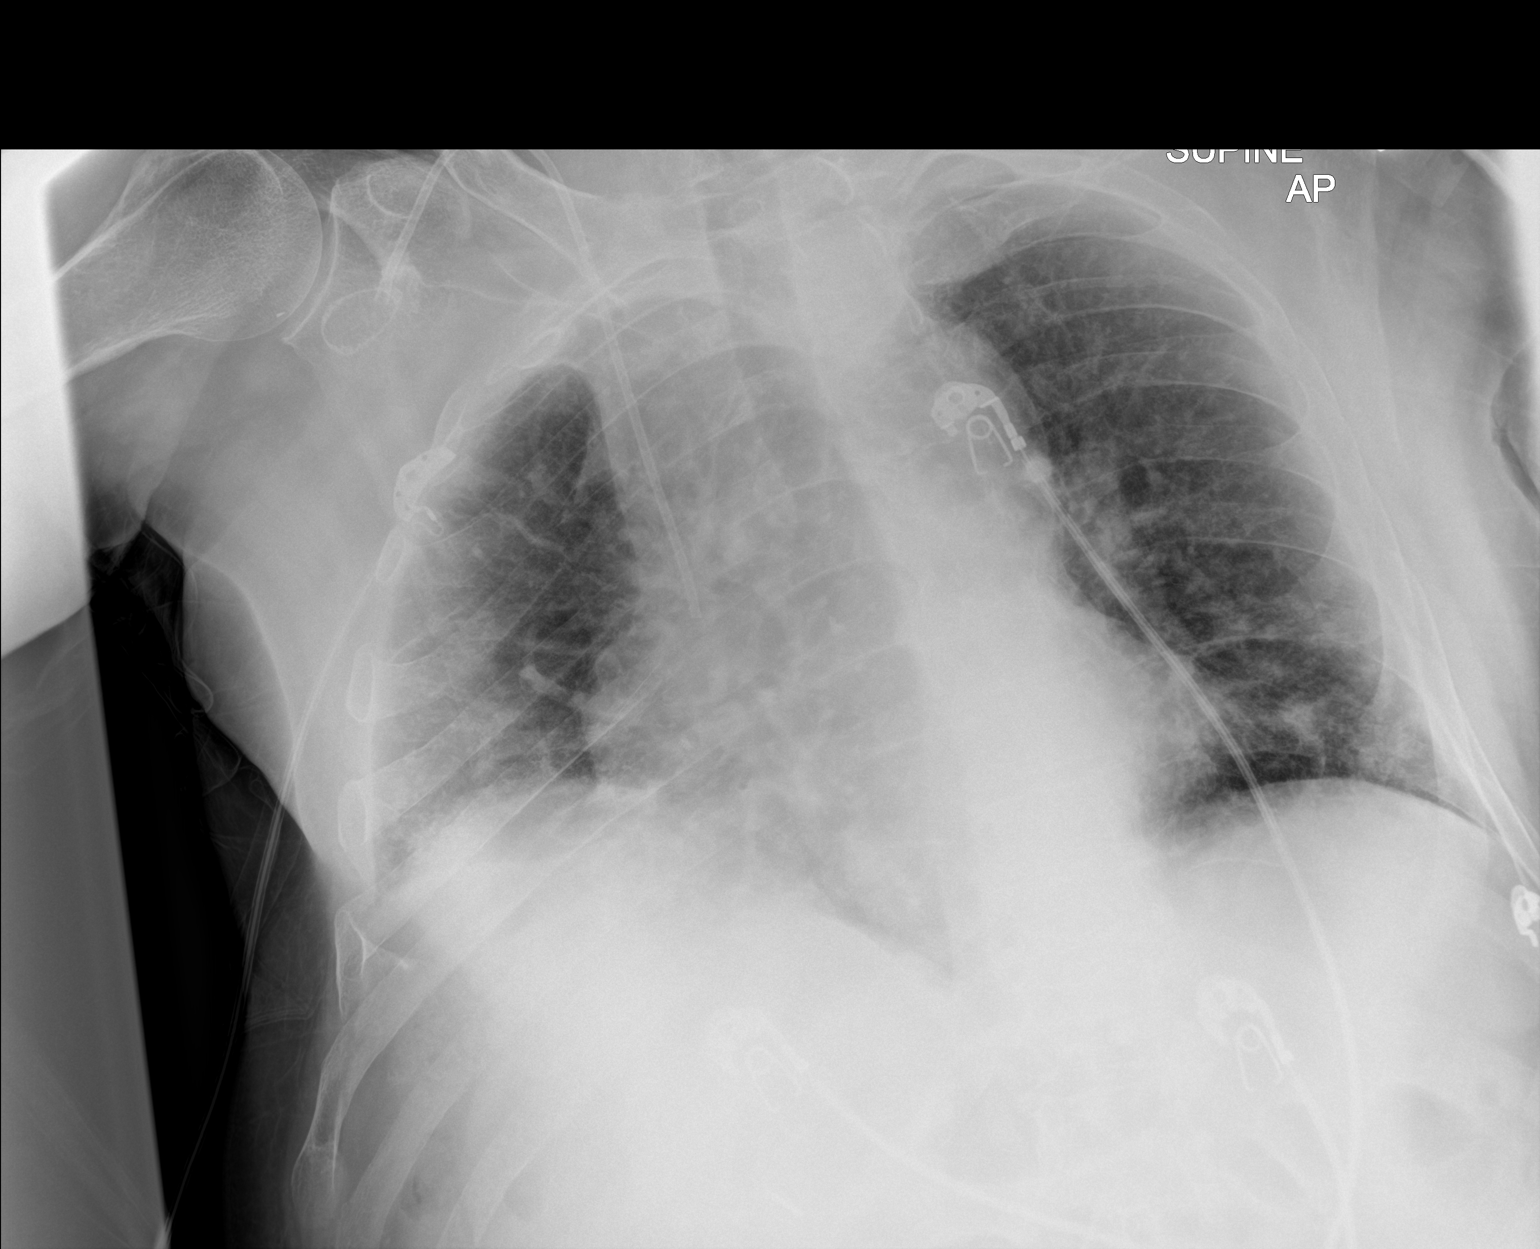

[2 of 2 positions shown; findings below may reference images not displayed]

FINDINGS: Right-sided Port-A-Cath with tip at the cavoatrial junction. There
is shallow inspiration. Diffuse interstitial prominence and
bibasilar reticular scarring. No focal consolidation, pleural
effusion, or pneumothorax. Mild cardiomegaly. No acute osseous
pathology.
IMPRESSION: 1. No acute cardiopulmonary process.
2. Mild cardiomegaly.

## 2020-12-29 IMAGING — CT CT CHEST-ABD-PELV W/O CM
2 of 4 series · 14 of 36 positions shown, 16 images · non-contrast
Comparison: [DATE]

CLINICAL DATA: Stage IV lung cancer with intracranial metastases,
fell, altered level of consciousness

EXAM:
CT CHEST, ABDOMEN AND PELVIS WITHOUT CONTRAST
TECHNIQUE: Multidetector CT imaging of the chest, abdomen and pelvis was
performed following the standard protocol without IV contrast.
Unenhanced CT was performed per clinician order. Lack of IV contrast
limits sensitivity and specificity, especially for evaluation of
vascular structures and abdominal/pelvic solid viscera.

[Series 2: axial st · axial · 0.89mm/px · z∈[+440,+1000]mm · 11 of 134 slices shown, 13 images]
[im 11/134  mediastinal]
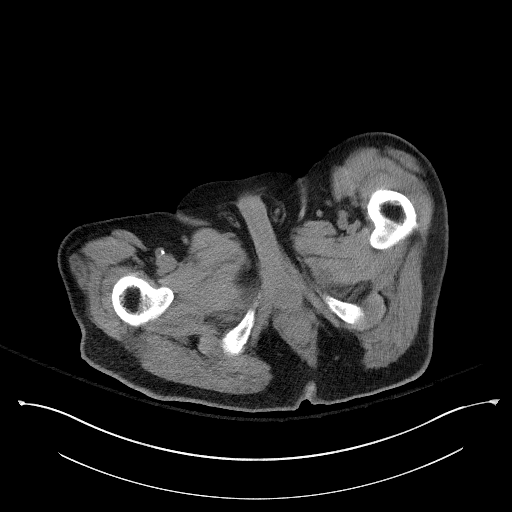
[im 11/134  bone]
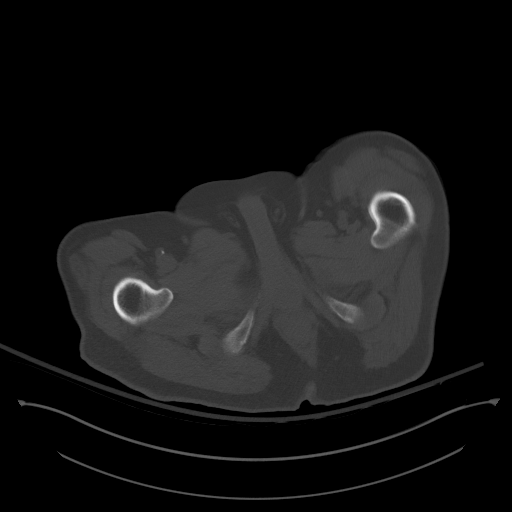
[im 21/134  mediastinal]
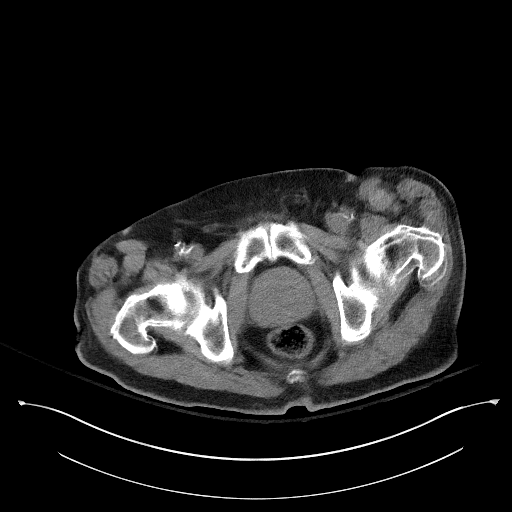
[im 31/134  mediastinal]
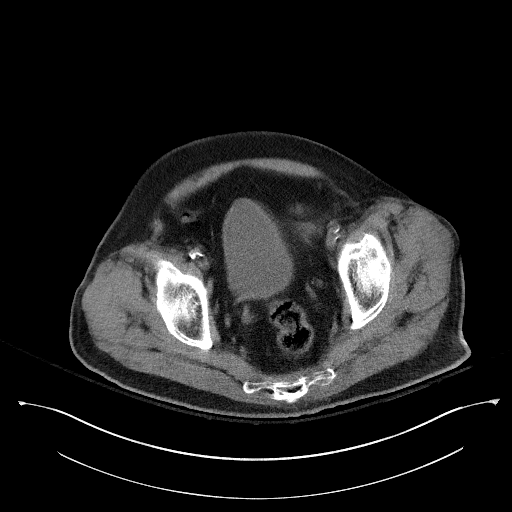
[im 41/134  mediastinal]
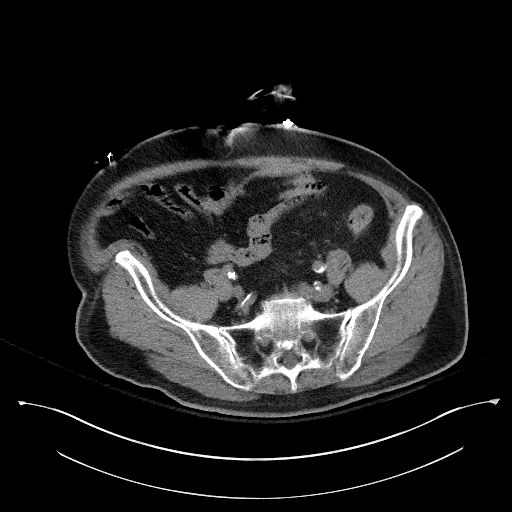
[im 52/134  mediastinal]
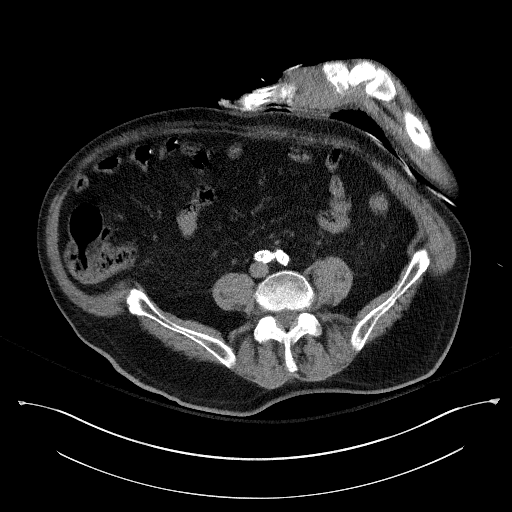
[im 72/134  mediastinal]
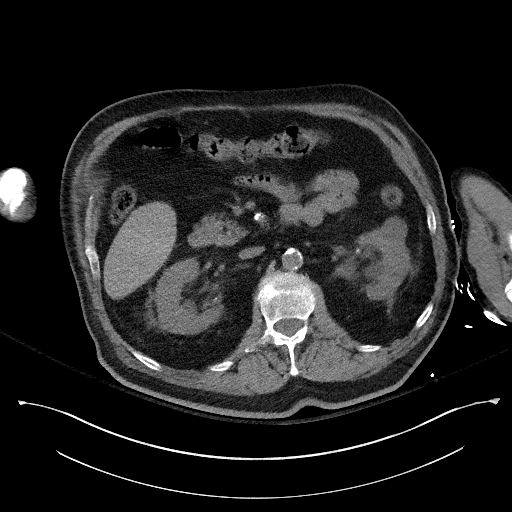
[im 82/134  mediastinal]
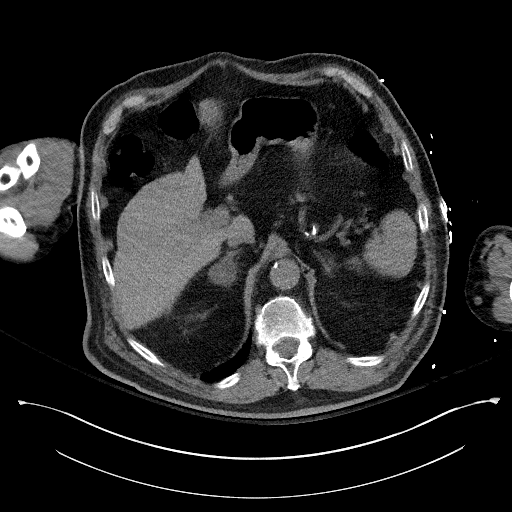
[im 93/134  mediastinal]
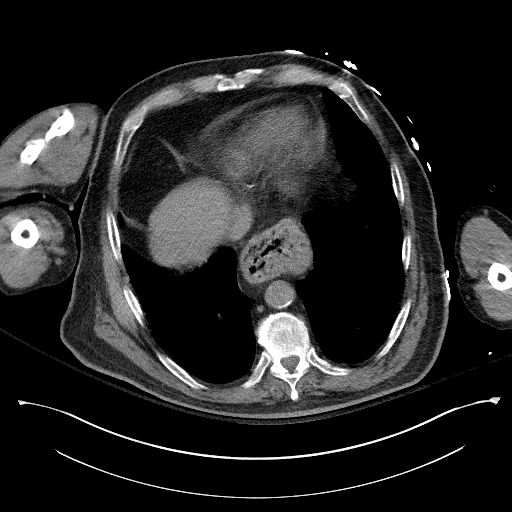
[im 103/134  mediastinal]
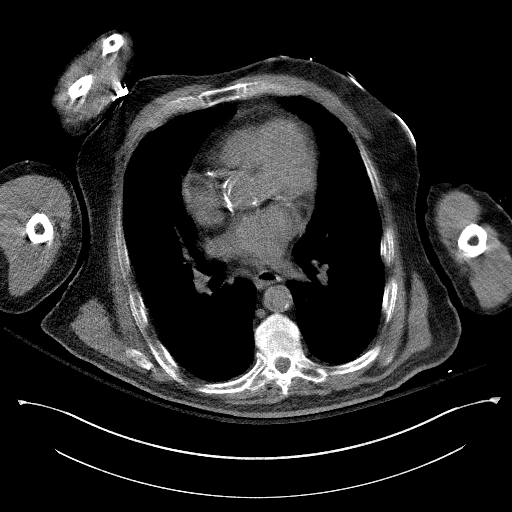
[im 103/134  bone]
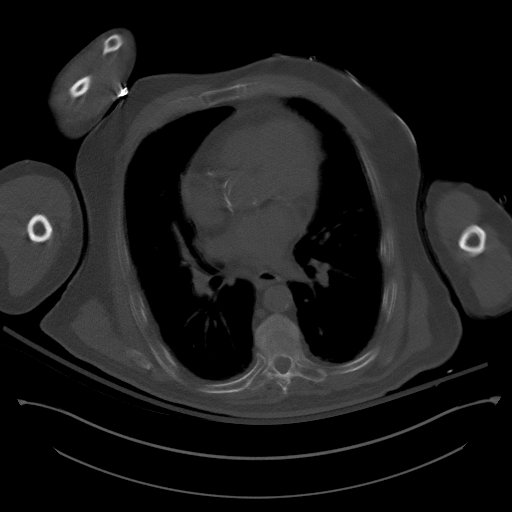
[im 113/134  mediastinal]
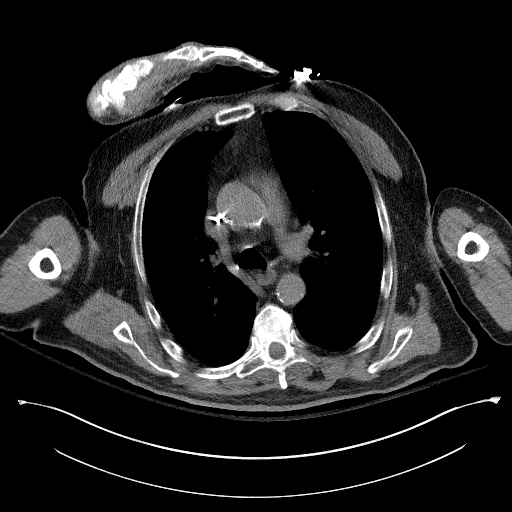
[im 123/134  mediastinal]
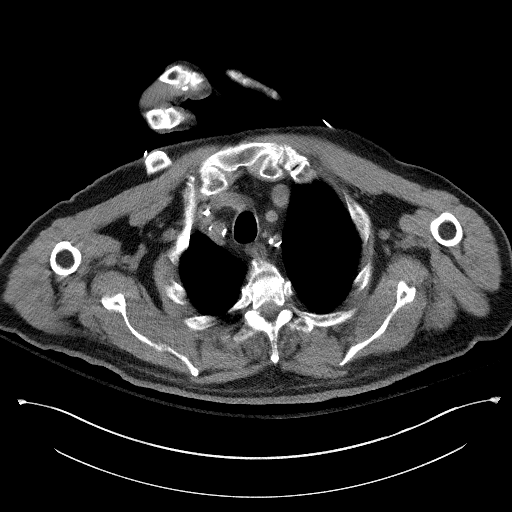

[Series 5: coronal · coronal · 0.85mm/px · 3 of 179 slices shown]
[im 36/179  mediastinal]
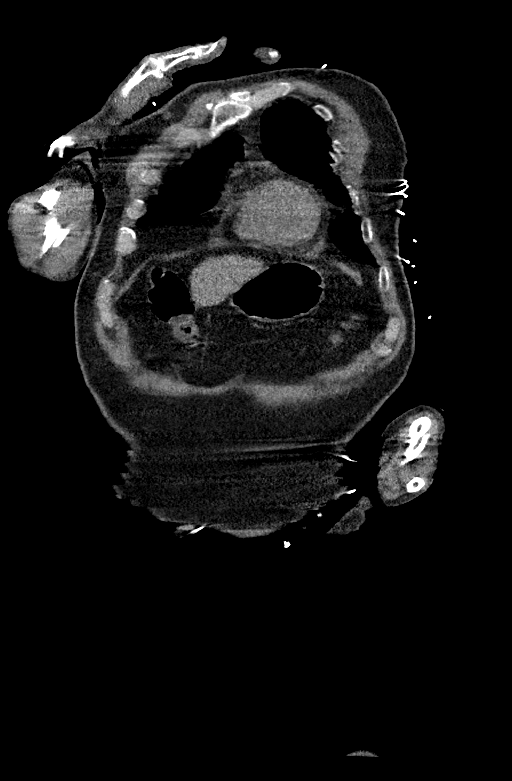
[im 72/179  mediastinal]
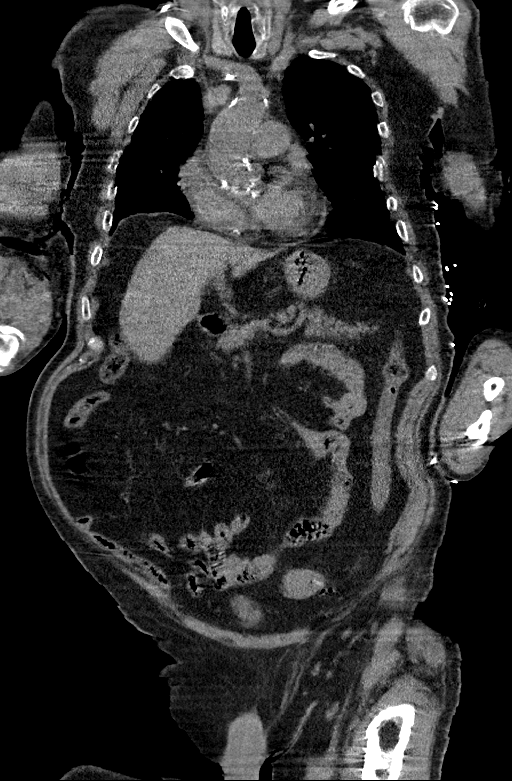
[im 107/179  mediastinal]
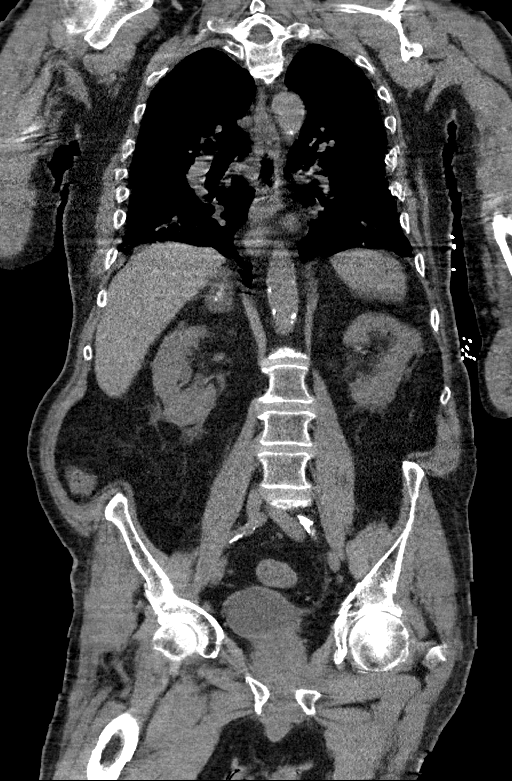

[14 of 36 positions shown; findings below may reference images not displayed]

FINDINGS: CT CHEST FINDINGS

Cardiovascular: Unenhanced imaging of the heart and great vessels
demonstrates no pericardial effusion. Normal caliber of the thoracic
aorta. Extensive atherosclerosis of the aorta and coronary vessels.

Mediastinum/Nodes: 8 mm short axis subcarinal lymph node is
unchanged. No new lymphadenopathy. Thyroid, trachea, and esophagus
are unremarkable. Stable small hiatal hernia.

Lungs/Pleura: No acute airspace disease, effusion, or pneumothorax.
Stable 7 mm subpleural right upper lobe pulmonary nodule, reference
image 48/4. The right upper lobe subpleural nodule at the junction
of the major and minor fissures on prior study is not identified on
this exam. 7 mm lingular nodule image 79/4 and and 8 mm left lower
lobe pulmonary nodule image 94/4 are unchanged. Stable subpleural
scarring and fibrosis at the lung bases. Central airways are patent.

Musculoskeletal: No acute or destructive bony lesions. Reconstructed
images demonstrate no additional findings.

CT ABDOMEN PELVIS FINDINGS

Hepatobiliary: Gallbladder is surgically absent. Unenhanced imaging
of the liver demonstrates no gross abnormalities.

Pancreas: Unremarkable. No pancreatic ductal dilatation or
surrounding inflammatory changes.

Spleen: Normal in size without focal abnormality.

Adrenals/Urinary Tract: Stable 2.7 cm right adrenal nodule
previously shown to have imaging characteristics compatible with
adenoma. The left adrenal is grossly normal.

Bilateral renal cortical cysts are identified. No urinary tract
calculi or obstructive uropathy.

Asymmetric wall thickening along the left posterolateral aspect of
the bladder again seen, measuring 6 mm.

Stomach/Bowel: There is chronic wall thickening of the mid sigmoid
colon, consistent with scarring from prior inflammation. Diffuse
diverticulosis of the distal colon without evidence of acute
diverticulitis. No bowel obstruction or ileus.

Vascular/Lymphatic: Extensive atherosclerosis of the aorta.
Bilateral common iliac artery stents are noted. No pathologic
adenopathy.

Reproductive: Prostate is enlarged but stable.

Other: No free fluid or free gas.  No abdominal wall hernia.

Musculoskeletal: No acute or destructive bony lesions. Reconstructed
images demonstrate no additional findings.
IMPRESSION: 1. Stable bilateral subcentimeter pulmonary nodules. These remain
indeterminate, and close attention on follow-up is recommended to
exclude metastatic disease.
2. Stable subcentimeter subcarinal lymph node. No pathologic
adenopathy.
3. Chronic wall thickening of the left posterolateral aspect of the
bladder, unchanged since [Q4]. If not previously performed,
cystoscopy may be useful.
4. Chronic wall thickening of the mid sigmoid colon, consistent with
scarring after previous bouts of inflammation. No evidence of acute
inflammation or diverticulitis on today's exam.
5. Stable right adrenal adenoma.
6.  Aortic Atherosclerosis ([Q4]-[Q4]).

## 2020-12-29 IMAGING — CT CT T SPINE W/O CM
3 of 5 series · 13 of 33 positions shown, 15 images · non-contrast
Comparison: Comparison made with concomitant CT of the chest,
abdomen, and pelvis.

CLINICAL DATA: Initial evaluation for acute trauma, fall.

EXAM:
CT THORACIC AND LUMBAR SPINE WITHOUT CONTRAST
TECHNIQUE: Multidetector CT imaging of the thoracic and lumbar spine was
performed without contrast. Multiplanar CT image reconstructions
were also generated.

[Series 1: t/l spine · axial · 0.39mm/px · z∈[+595,+1015]mm · 7 of 250 slices shown, 9 images]
[im 20/250  soft-tissue]
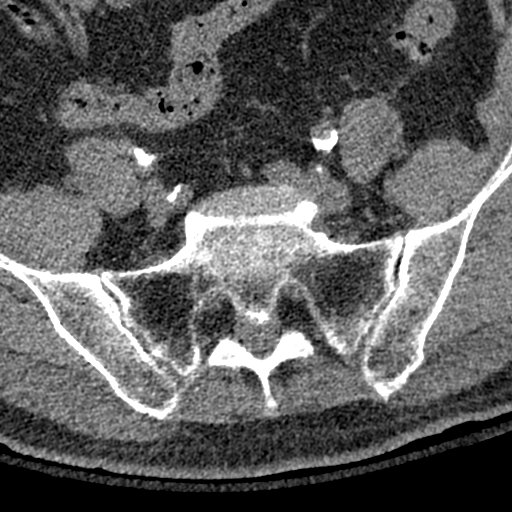
[im 20/250  bone]
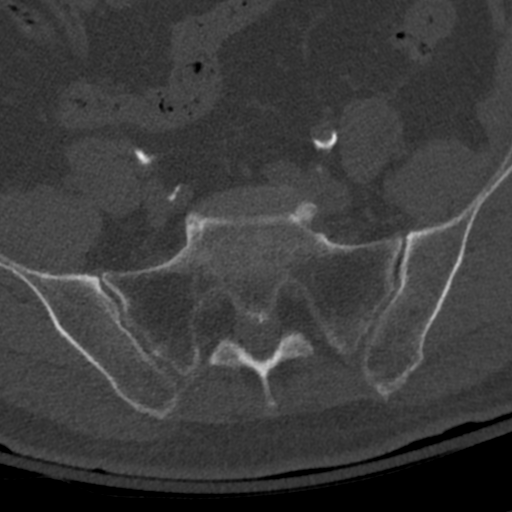
[im 58/250  bone]
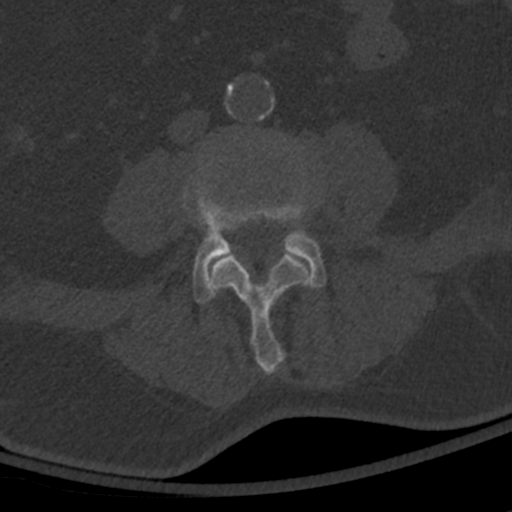
[im 96/250  bone]
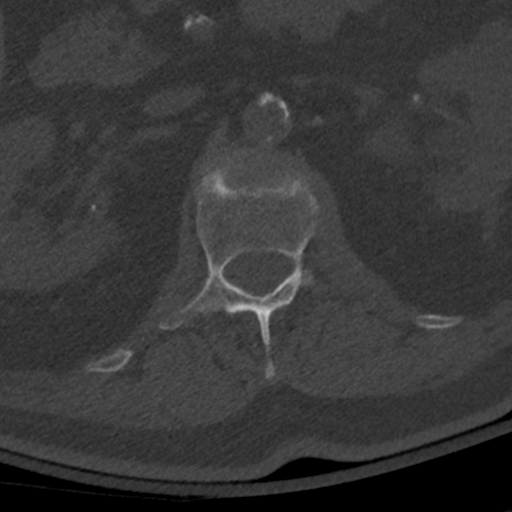
[im 135/250  bone]
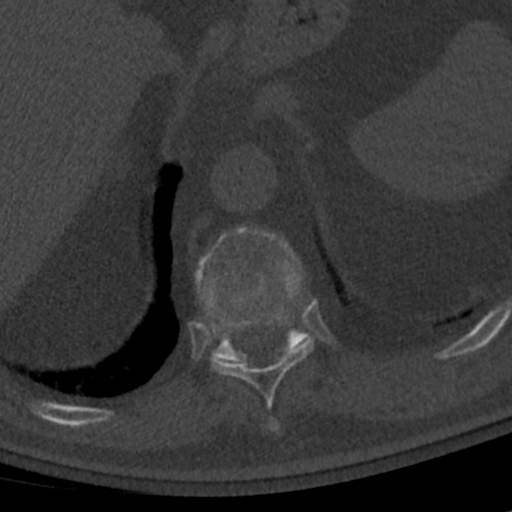
[im 154/250  soft-tissue]
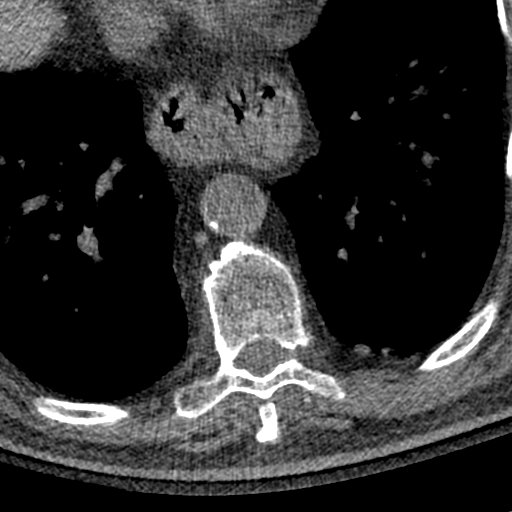
[im 154/250  bone]
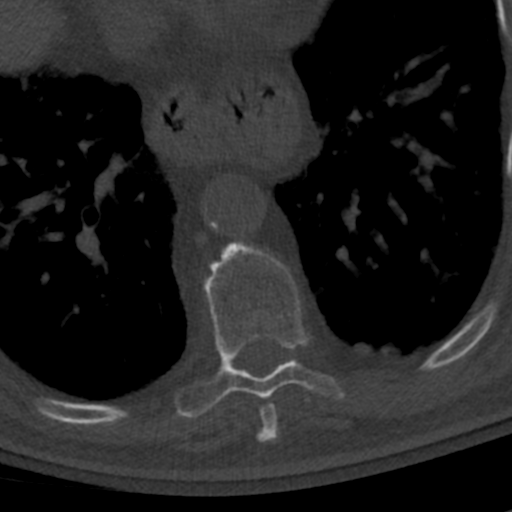
[im 192/250  bone]
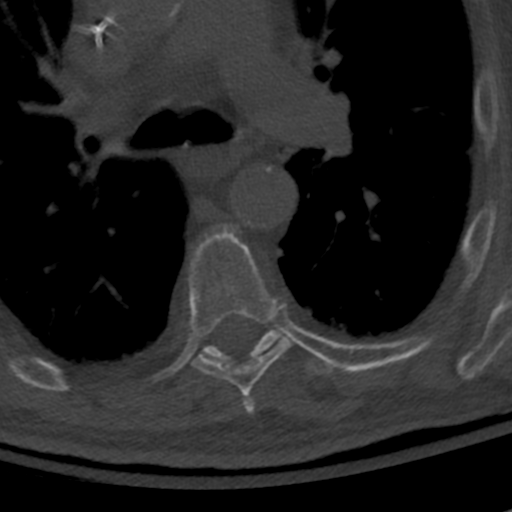
[im 230/250  bone]
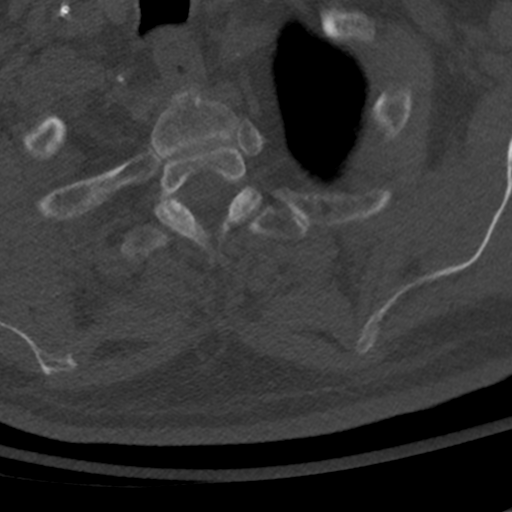

[Series 4: t spine cor · coronal · 0.39mm/px · 1 of 112 slices shown]
[im 56/112  bone]
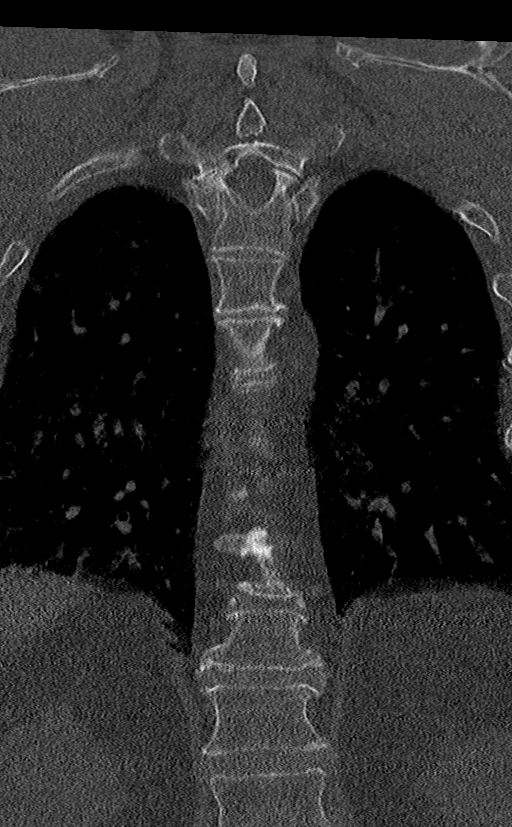

[Series 6: t spine sag · sagittal · 0.39mm/px · 5 of 97 slices shown]
[im 17/97  bone]
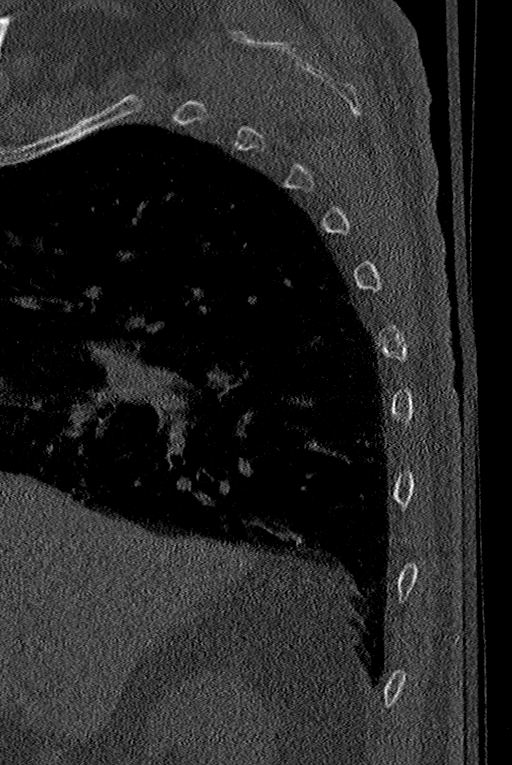
[im 33/97  bone]
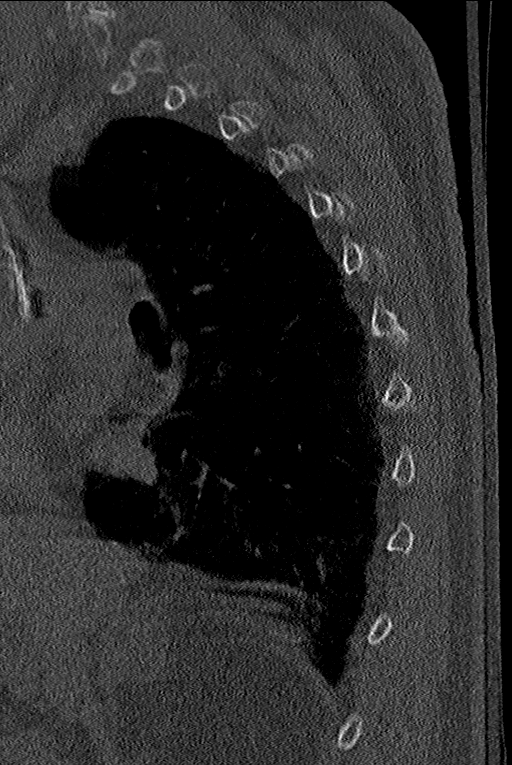
[im 49/97  bone]
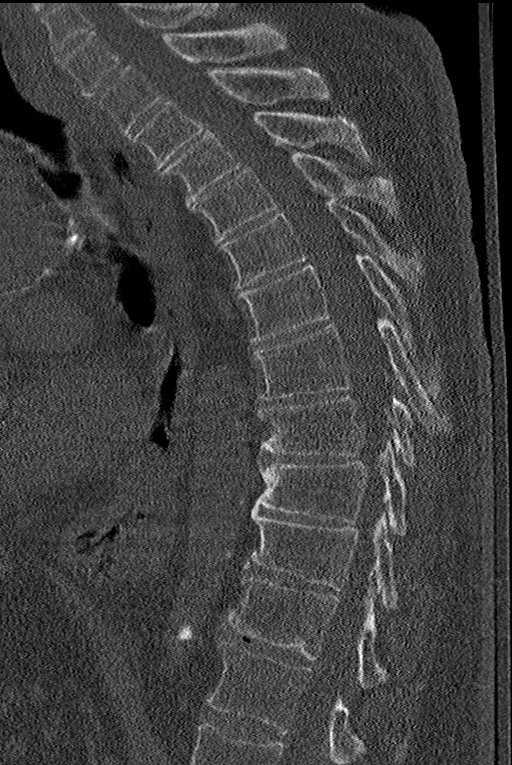
[im 65/97  bone]
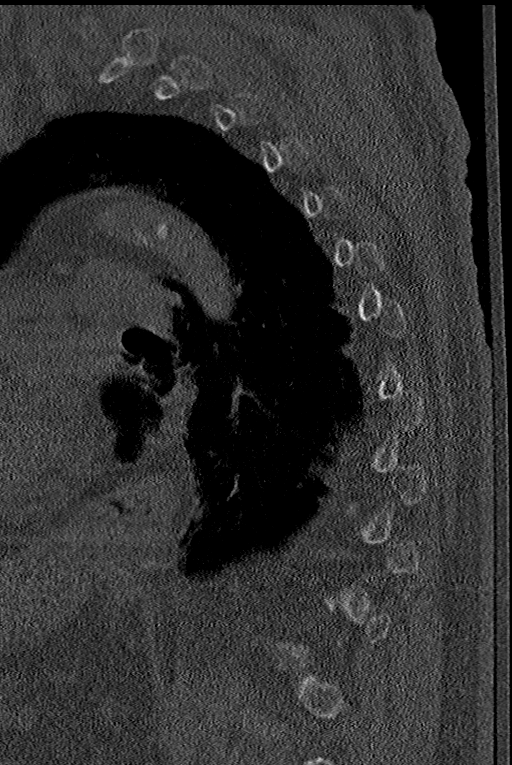
[im 81/97  bone]
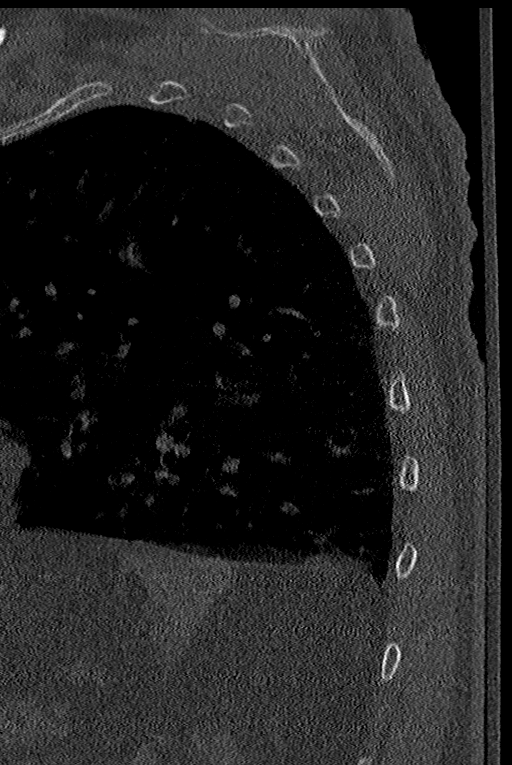

[13 of 33 positions shown; findings below may reference images not displayed]

FINDINGS: CT THORACIC SPINE FINDINGS

Alignment: Examination mildly degraded by motion artifact.

Exaggeration of the normal thoracic kyphosis with underlying trace
scoliotic curvature. No significant listhesis.

Vertebrae: Vertebral body height maintained without acute or chronic
fracture. Visualized ribs intact. No discrete osseous lesions.

Paraspinal and other soft tissues: Paraspinous soft tissues
demonstrate no acute finding. Scattered pulmonary nodules noted
within the partially visualized lungs, better evaluated on
concomitant chest CT. Aortic atherosclerosis. Scattered coronary
artery calcifications. Hiatal hernia.

Disc levels: No visible significant disc bulge or focal disc
herniation. No significant spinal stenosis. Mild degenerative
endplate spurring noted within the mid and lower thoracic spine.

CT LUMBAR SPINE FINDINGS

Segmentation: Is standard. Lowest well-formed disc space labeled the
L5-S1 level.

Alignment: Physiologic with preservation of the normal lumbar
lordosis. No listhesis.

Vertebrae: Vertebral body height maintained without acute or chronic
fracture. Visualized sacrum and pelvis intact. SI joints symmetric
and normal. No worrisome osseous lesions.

Paraspinal and other soft tissues: Paraspinous soft tissues
demonstrate no acute finding. Advanced aorto bi-iliac
atherosclerotic disease. Iliac stents in place. Right adrenal
adenoma noted. Few scattered benign appearing cyst noted about the
visualized kidneys.

Disc levels:

L1-2: Degenerative intervertebral disc space narrowing with mild
disc bulge. Mild reactive endplate spurring. No spinal stenosis.
Foramina remain patent.

L2-3: Disc bulge with mild reactive endplate spurring. Disc bulge
slightly eccentric to the right. Moderate right with mild left facet
hypertrophy. No significant spinal stenosis. Mild right worse than
left L2 foraminal stenosis.

L3-4: Mild disc bulge with minimal reactive endplate spurring. Disc
bulge slightly asymmetric to the right. Mild facet hypertrophy. No
significant spinal stenosis. Foramina remain patent.

L4-5: Minimal disc bulge. Mild to moderate bilateral facet
hypertrophy. No significant spinal stenosis. Mild left L4 foraminal
narrowing. Right neural foramina remains patent.

L5-S1: Degenerative intervertebral disc space narrowing with disc
desiccation and diffuse disc bulge. Reactive endplate spurring. Mild
facet hypertrophy. No significant spinal stenosis. Moderate
bilateral L5 foraminal narrowing.
IMPRESSION: 1. No acute traumatic injury within the thoracic or lumbar spine.
2. Mild multilevel degenerative spondylosis without significant
spinal stenosis moderate bilateral L5 foraminal stenosis.
3. Aortic Atherosclerosis ([VE]-[VE]).

## 2020-12-29 MED ORDER — DIPHENHYDRAMINE HCL 50 MG/ML IJ SOLN
25.0000 mg | Freq: Once | INTRAMUSCULAR | Status: AC
  Administered 2020-12-29: 25 mg via INTRAVENOUS
  Filled 2020-12-29: qty 1

## 2020-12-29 MED ORDER — DROPERIDOL 2.5 MG/ML IJ SOLN
2.5000 mg | Freq: Once | INTRAMUSCULAR | Status: AC
  Administered 2020-12-29: 2.5 mg via INTRAVENOUS
  Filled 2020-12-29: qty 2

## 2020-12-29 MED ORDER — LORAZEPAM 2 MG/ML IJ SOLN
1.0000 mg | Freq: Once | INTRAMUSCULAR | Status: AC
  Administered 2020-12-29: 1 mg via INTRAVENOUS
  Filled 2020-12-29: qty 1

## 2020-12-29 MED ORDER — DEXAMETHASONE SODIUM PHOSPHATE 4 MG/ML IJ SOLN
4.0000 mg | Freq: Once | INTRAMUSCULAR | Status: AC
  Administered 2020-12-29: 4 mg via INTRAVENOUS
  Filled 2020-12-29: qty 1

## 2020-12-29 NOTE — ED Provider Notes (Signed)
[facility] Emergency Department Provider Note   ____________________________________________   Event Date/Time   First MD Initiated Contact with Patient 12/05/2020 2035     (approximate)  I have reviewed the triage vital signs and the nursing notes.   HISTORY  Chief Complaint Fall    HPI Alejandro Randolph is a 75 y.o. male with past medical history of hypertension, hyperlipidemia, diabetes, and lung cancer metastatic to brain who presents to the ED for altered mental status.  History is limited due to patient's confusion and majority of history is obtained from EMS.  EMS states that patient fell about 3 hours ago and per wife has "not been quite right" since the fall.  He was altered and agitated with EMS, unable to clearly respond to questions.  Patient speaks in garbled phrases or repeatedly answers yes, does not follow commands.  EMS states that he is alert and oriented at baseline, does have significant trouble hearing.        Past Medical History:  Diagnosis Date  . Abscess of sigmoid colon   . Cancer [facility])    brain tumor  . Diabetes mellitus without complication ([place])   . GERD (gastroesophageal reflux disease)   . Hypercholesteremia   . Hypertension   . Peripheral vascular disease ([place])   . PUD (peptic ulcer disease)     Patient Active Problem List   Diagnosis Date Noted  . Encounter for antineoplastic chemotherapy 12/15/2020  . Primary cancer of right upper lobe of lung ([place]) 09/06/2020  . Abnormal PET scan of colon 08/29/2020  . Brain metastasis ([place]) 08/21/2020  . Diabetes ([place]) 04/13/2018  . Atherosclerosis of native arteries of extremity with intermittent claudication ([place]) 04/11/2017  . Hyperlipidemia 04/11/2017  . Leg pain 04/11/2017  . Bleeding ulcer 07/14/2016  . Hypertension 07/14/2016  . Foreign body in colon     Past Surgical History:  Procedure Laterality Date  . CHOLECYSTECTOMY    . COLONOSCOPY WITH PROPOFOL N/A  07/19/2016   Procedure: COLONOSCOPY WITH PROPOFOL;  Surgeon: Jonathon Bellows, MD;  Location: [facility] ORS;  Service: Endoscopy;  Laterality: N/A;  . COLONOSCOPY WITH PROPOFOL N/A 09/02/2016   Procedure: COLONOSCOPY WITH PROPOFOL;  Surgeon: Jonathon Bellows, MD;  Location: [facility] ENDOSCOPY;  Service: Endoscopy;  Laterality: N/A;  . COLONOSCOPY WITH PROPOFOL N/A 09/04/2020   Procedure: COLONOSCOPY WITH PROPOFOL;  Surgeon: Jonathon Bellows, MD;  Location: [facility] ENDOSCOPY;  Service: Gastroenterology;  Laterality: N/A;  . femoral stents Bilateral   . FLEXIBLE SIGMOIDOSCOPY  07/19/2016   Procedure: FLEXIBLE SIGMOIDOSCOPY;  Surgeon: Jonathon Bellows, MD;  Location: [facility] ORS;  Service: Endoscopy;;  . HERNIA REPAIR    . IR IMAGING GUIDED PORT INSERTION  10/10/2020  . RETINAL DETACHMENT SURGERY    . TONSILLECTOMY    . VIDEO BRONCHOSCOPY WITH ENDOBRONCHIAL ULTRASOUND N/A 09/01/2020   Procedure: VIDEO BRONCHOSCOPY WITH ENDOBRONCHIAL ULTRASOUND;  Surgeon: Ottie Glazier, MD;  Location: [facility] ORS;  Service: Thoracic;  Laterality: N/A;    Prior to Admission medications   Medication Sig Start Date End Date Taking? Authorizing Provider  amLODipine (NORVASC) 10 MG tablet Take 10 mg by mouth daily. 06/23/16   [provider]  aspirin EC 81 MG tablet Take 81 mg by mouth daily.    [provider]  fluticasone (FLONASE) 50 MCG/ACT nasal spray Place 1 spray into both nostrils daily as needed for allergies. 07/08/20   [provider]  folic acid (FOLVITE) 1 MG tablet Take 1 tablet (1 mg total) by  mouth daily. 09/29/20   Cammie Sickle, MD  glipiZIDE (GLUCOTROL) 5 MG tablet TAKE 1 TABLET BY MOUTH ONCE A DAY BEFOREBREAKFAST 12/11/20   Borders, Kirt Boys, NP  glucose blood (ONETOUCH ULTRA) test strip USE AS DIRECTED TWICE A DAY TO CHECK BLOOD SUGAR 03/02/19   [provider]  lidocaine-prilocaine (EMLA) cream Apply 1 application topically as needed. Apply 30-45 mins prior to port access. 09/29/20   Cammie Sickle, MD  metFORMIN (GLUCOPHAGE-XR) 500 MG 24 hr tablet Take 500 mg by mouth 2 (two) times daily. 06/05/16   [provider]  Omega-3 Fatty Acids (OMEGA-3 FISH OIL) 500 MG CAPS Take 500 mg by mouth daily.    [provider]  ondansetron (ZOFRAN) 8 MG tablet One pill every 8 hours as needed for nausea/vomitting. 09/29/20   Cammie Sickle, MD  pantoprazole (PROTONIX) 40 MG tablet Take 40 mg by mouth daily. 05/25/16   [provider]  potassium chloride (KLOR-CON) 10 MEQ tablet Take 1 tablet (10 mEq total) by mouth daily. 12/15/20   Earlie Server, MD  ROCKLATAN 0.02-0.005 % SOLN Place 1 drop into the left eye daily. 08/17/19   [provider]  rosuvastatin (CRESTOR) 5 MG tablet Take 5 mg by mouth at bedtime. 05/10/16   [provider]  Saw Palmetto, Serenoa repens, (SAW PALMETTO PO) Take 450 mg by mouth daily.    [provider]    Allergies Atorvastatin  Family History  Problem Relation Age of Onset  . Diabetes Mother   . Hypertension Mother   . Diabetes Father     Social History Social History   Tobacco Use  . Smoking status: Former Smoker    Quit date: 2001    Years since quitting: 21.4  . Smokeless tobacco: Never Used  Vaping Use  . Vaping Use: Never used  Substance Use Topics  . Alcohol use: No  . Drug use: No    Review of Systems Unable to obtain secondary to altered mental status  ____________________________________________   PHYSICAL EXAM:  VITAL SIGNS: ED Triage Vitals  Enc Vitals Group     BP      Pulse      Resp      Temp      Temp src      SpO2      Weight      Height      Head Circumference      Peak Flow      Pain Score      Pain Loc      Pain Edu?      Excl. in [place]?     Constitutional: Awake and alert, unable to answer questions. Eyes: Conjunctivae are normal.  Pupils equal round reactive to light bilaterally. Head: Atraumatic. Nose: No congestion/rhinnorhea. Mouth/Throat: Mucous  membranes are moist. Neck: Normal ROM Cardiovascular: Normal rate, regular rhythm. Grossly normal heart sounds. Respiratory: Normal respiratory effort.  No retractions. Lungs CTAB. Gastrointestinal: Soft and nontender. No distention. Genitourinary: deferred Musculoskeletal: No lower extremity tenderness nor edema. Neurologic: Speaks in garbled phrases. No gross focal neurologic deficits are appreciated, moving all extremities equally. Skin:  Skin is warm, dry and intact. No rash noted. Psychiatric: Unable to assess.  ____________________________________________   LABS (all labs ordered are listed, but only abnormal results are displayed)  Labs Reviewed  CBC WITH DIFFERENTIAL/PLATELET - Abnormal; Notable for the following components:      Result Value   RBC 3.25 (*)  Hemoglobin 10.9 (*)    HCT 31.5 (*)    Platelets 98 (*)    All other components within normal limits  COMPREHENSIVE METABOLIC PANEL - Abnormal; Notable for the following components:   Glucose, Bld 153 (*)    All other components within normal limits  URINALYSIS, COMPLETE (UACMP) WITH MICROSCOPIC - Abnormal; Notable for the following components:   Color, Urine YELLOW (*)    APPearance CLEAR (*)    Hgb urine dipstick MODERATE (*)    Ketones, ur 20 (*)    Protein, ur 100 (*)    All other components within normal limits  RESP PANEL BY RT-PCR (FLU A&B, COVID) ARPGX2  TROPONIN I (HIGH SENSITIVITY)  TROPONIN I (HIGH SENSITIVITY)   ____________________________________________  EKG  ED ECG REPORT I, Blake Divine, the attending physician, personally viewed and interpreted this ECG.   Date: 12/17/2020  EKG Time: 21:52  Rate: 106  Rhythm: sinus tachycardia  Axis: Normal  Intervals:none  ST&T Change: None   PROCEDURES  Procedure(s) performed (including Critical Care):  Procedures   ____________________________________________   INITIAL IMPRESSION / ASSESSMENT AND PLAN / ED COURSE        75 year old male with past medical history of hypertension, hyperlipidemia, diabetes, and lung cancer metastatic to brain who presents to the ED for altered mental status about 3 hours after having a fall.  Patient arrives completely disoriented, unable to speak coherently and does not follow commands.  Pupils are equal and reactive bilaterally, no focal deficits noted as patient is moving all extremities equally.  He is not anticoagulated but does have significant metastasis to brain, we will need to further assess with CT head and cervical spine.  Given patient's agitation, will medicate with 1 mg of Ativan to facilitate CT and further work-up.  Patient continued to be agitated despite Ativan, was given 2.5 mg of droperidol IV.  He is now color but appears tremulous, may be having extraparametal effects due to tramadol and we will give dose of Benadryl.  CT head performed and negative for acute process, does show stable edema related to known metastases.  These findings were discussed with Dr. Lacinda Axon of neurosurgery, who agrees that they are unlikely to be causing his presentation of acute agitation and altered mental status.  He does recommend proceeding with 4 mg IV Decadron twice daily for the edema as this may improve patient's mental status.  Labs are unremarkable and there are no signs of infectious process at this time.  We will add on CT scans of chest/abdomen/pelvis as well as thoracic and lumbar spine given patient's earlier complaints of back pain.  Unclear etiology of his altered mental status, patient turned over to oncoming provider pending additional CT results.  He will require admission to hospitalist service for further work-up.      ____________________________________________   FINAL CLINICAL IMPRESSION(S) / ED DIAGNOSES  Final diagnoses:  Fall  Altered mental status, unspecified altered mental status type     ED Discharge Orders    None       Note:  This document was  prepared using Dragon voice recognition software and may include unintentional dictation errors.   Blake Divine, MD 12/16/2020 [phone]

## 2020-12-29 NOTE — ED Triage Notes (Signed)
Pt BIB EMS for fall. Pt wife called and stated "he was not quite right" after the fall.  Pt is stage 4 lung cancer with mets to the brain and is agitated in triage

## 2020-12-30 ENCOUNTER — Other Ambulatory Visit: Payer: Self-pay

## 2020-12-30 DIAGNOSIS — C3411 Malignant neoplasm of upper lobe, right bronchus or lung: Secondary | ICD-10-CM | POA: Diagnosis present

## 2020-12-30 DIAGNOSIS — R4182 Altered mental status, unspecified: Secondary | ICD-10-CM | POA: Diagnosis present

## 2020-12-30 DIAGNOSIS — J9601 Acute respiratory failure with hypoxia: Secondary | ICD-10-CM | POA: Diagnosis present

## 2020-12-30 DIAGNOSIS — K219 Gastro-esophageal reflux disease without esophagitis: Secondary | ICD-10-CM | POA: Diagnosis present

## 2020-12-30 DIAGNOSIS — W19XXXA Unspecified fall, initial encounter: Secondary | ICD-10-CM | POA: Diagnosis present

## 2020-12-30 DIAGNOSIS — G9341 Metabolic encephalopathy: Secondary | ICD-10-CM | POA: Diagnosis present

## 2020-12-30 DIAGNOSIS — C7931 Secondary malignant neoplasm of brain: Secondary | ICD-10-CM | POA: Diagnosis present

## 2020-12-30 DIAGNOSIS — Z8249 Family history of ischemic heart disease and other diseases of the circulatory system: Secondary | ICD-10-CM | POA: Diagnosis not present

## 2020-12-30 DIAGNOSIS — E7800 Pure hypercholesterolemia, unspecified: Secondary | ICD-10-CM | POA: Diagnosis present

## 2020-12-30 DIAGNOSIS — Z87891 Personal history of nicotine dependence: Secondary | ICD-10-CM | POA: Diagnosis not present

## 2020-12-30 DIAGNOSIS — I4891 Unspecified atrial fibrillation: Secondary | ICD-10-CM | POA: Diagnosis present

## 2020-12-30 DIAGNOSIS — Z515 Encounter for palliative care: Secondary | ICD-10-CM | POA: Diagnosis not present

## 2020-12-30 DIAGNOSIS — E785 Hyperlipidemia, unspecified: Secondary | ICD-10-CM | POA: Diagnosis present

## 2020-12-30 DIAGNOSIS — E1151 Type 2 diabetes mellitus with diabetic peripheral angiopathy without gangrene: Secondary | ICD-10-CM | POA: Diagnosis present

## 2020-12-30 DIAGNOSIS — D6959 Other secondary thrombocytopenia: Secondary | ICD-10-CM | POA: Diagnosis present

## 2020-12-30 DIAGNOSIS — G936 Cerebral edema: Secondary | ICD-10-CM | POA: Diagnosis present

## 2020-12-30 DIAGNOSIS — Z833 Family history of diabetes mellitus: Secondary | ICD-10-CM | POA: Diagnosis not present

## 2020-12-30 DIAGNOSIS — G932 Benign intracranial hypertension: Secondary | ICD-10-CM | POA: Diagnosis present

## 2020-12-30 DIAGNOSIS — Y92009 Unspecified place in unspecified non-institutional (private) residence as the place of occurrence of the external cause: Secondary | ICD-10-CM

## 2020-12-30 DIAGNOSIS — Z66 Do not resuscitate: Secondary | ICD-10-CM | POA: Diagnosis not present

## 2020-12-30 DIAGNOSIS — D696 Thrombocytopenia, unspecified: Secondary | ICD-10-CM

## 2020-12-30 DIAGNOSIS — Z20822 Contact with and (suspected) exposure to covid-19: Secondary | ICD-10-CM | POA: Diagnosis present

## 2020-12-30 DIAGNOSIS — T451X5A Adverse effect of antineoplastic and immunosuppressive drugs, initial encounter: Secondary | ICD-10-CM | POA: Diagnosis present

## 2020-12-30 DIAGNOSIS — R509 Fever, unspecified: Secondary | ICD-10-CM | POA: Diagnosis not present

## 2020-12-30 DIAGNOSIS — I472 Ventricular tachycardia: Secondary | ICD-10-CM | POA: Diagnosis present

## 2020-12-30 DIAGNOSIS — I10 Essential (primary) hypertension: Secondary | ICD-10-CM | POA: Diagnosis present

## 2020-12-30 DIAGNOSIS — C3490 Malignant neoplasm of unspecified part of unspecified bronchus or lung: Secondary | ICD-10-CM | POA: Diagnosis present

## 2020-12-30 DIAGNOSIS — Z9049 Acquired absence of other specified parts of digestive tract: Secondary | ICD-10-CM | POA: Diagnosis not present

## 2020-12-30 DIAGNOSIS — Z8711 Personal history of peptic ulcer disease: Secondary | ICD-10-CM | POA: Diagnosis not present

## 2020-12-30 LAB — COMPREHENSIVE METABOLIC PANEL
ALT: 22 U/L (ref 0–44)
AST: 23 U/L (ref 15–41)
Albumin: 4 g/dL (ref 3.5–5.0)
Alkaline Phosphatase: 49 U/L (ref 38–126)
Anion gap: 12 (ref 5–15)
BUN: 13 mg/dL (ref 8–23)
CO2: 22 mmol/L (ref 22–32)
Calcium: 10.2 mg/dL (ref 8.9–10.3)
Chloride: 102 mmol/L (ref 98–111)
Creatinine, Ser: 0.99 mg/dL (ref 0.61–1.24)
GFR, Estimated: >60 mL/min (ref >60)
Glucose, Bld: 222 mg/dL — ABNORMAL HIGH (ref 70–99)
Potassium: 3.7 mmol/L (ref 3.5–5.1)
Sodium: 136 mmol/L (ref 135–145)
Total Bilirubin: 0.8 mg/dL (ref 0.3–1.2)
Total Protein: 7.2 g/dL (ref 6.5–8.1)

## 2020-12-30 LAB — LACTIC ACID, PLASMA: Lactic Acid, Venous: 1.4 mmol/L (ref 0.5–1.9)

## 2020-12-30 LAB — CBC WITH DIFFERENTIAL/PLATELET
Abs Immature Granulocytes: 0.02 10*3/uL (ref 0.00–0.07)
Basophils Absolute: 0 10*3/uL (ref 0.0–0.1)
Basophils Relative: 0 %
Eosinophils Absolute: 0 10*3/uL (ref 0.0–0.5)
Eosinophils Relative: 0 %
HCT: 30.9 % — ABNORMAL LOW (ref 39.0–52.0)
Hemoglobin: 11 g/dL — ABNORMAL LOW (ref 13.0–17.0)
Immature Granulocytes: 1 %
Lymphocytes Relative: 10 %
Lymphs Abs: 0.3 10*3/uL — ABNORMAL LOW (ref 0.7–4.0)
MCH: 34.2 pg — ABNORMAL HIGH (ref 26.0–34.0)
MCHC: 35.6 g/dL (ref 30.0–36.0)
MCV: 96 fL (ref 80.0–100.0)
Monocytes Absolute: 0.2 10*3/uL (ref 0.1–1.0)
Monocytes Relative: 9 %
Neutro Abs: 2.1 10*3/uL (ref 1.7–7.7)
Neutrophils Relative %: 80 %
Platelets: 97 10*3/uL — ABNORMAL LOW (ref 150–400)
RBC: 3.22 MIL/uL — ABNORMAL LOW (ref 4.22–5.81)
RDW: 15.5 % (ref 11.5–15.5)
WBC: 2.6 10*3/uL — ABNORMAL LOW (ref 4.0–10.5)
nRBC: 0 % (ref 0.0–0.2)

## 2020-12-30 LAB — PROTIME-INR
INR: 1.1 (ref 0.8–1.2)
Prothrombin Time: 14.1 seconds (ref 11.4–15.2)

## 2020-12-30 LAB — PROCALCITONIN: Procalcitonin: <0.1 ng/mL

## 2020-12-30 LAB — CBG MONITORING, ED
Glucose-Capillary: 190 mg/dL — ABNORMAL HIGH (ref 70–99)
Glucose-Capillary: 175 mg/dL — ABNORMAL HIGH (ref 70–99)

## 2020-12-30 LAB — HEMOGLOBIN A1C
Hgb A1c MFr Bld: 6.5 % — ABNORMAL HIGH (ref 4.8–5.6)
Mean Plasma Glucose: 140 mg/dL

## 2020-12-30 LAB — APTT: aPTT: 26 seconds (ref 24–36)

## 2020-12-30 MED ORDER — SODIUM CHLORIDE 0.9 % IV BOLUS (SEPSIS)
1000.0000 mL | Freq: Once | INTRAVENOUS | Status: AC
  Administered 2020-12-30: 1000 mL via INTRAVENOUS

## 2020-12-30 MED ORDER — MORPHINE SULFATE 2 MG/ML IJ SOLN
2.0000 mg | INTRAMUSCULAR | Status: DC | PRN
  Administered 2020-12-30: 2 mg via INTRAVENOUS
  Administered 2020-12-30: 4 mg via INTRAVENOUS
  Filled 2020-12-30: qty 2
  Filled 2020-12-30: qty 1

## 2020-12-30 MED ORDER — ONDANSETRON 4 MG PO TBDP
4.0000 mg | ORAL_TABLET | Freq: Four times a day (QID) | ORAL | Status: DC | PRN
Start: 2020-12-30 — End: 2020-12-31
  Filled 2020-12-30: qty 1

## 2020-12-30 MED ORDER — INSULIN ASPART 100 UNIT/ML IJ SOLN: 0.0000 [IU] | Freq: Every day | INTRAMUSCULAR | Status: DC

## 2020-12-30 MED ORDER — METRONIDAZOLE 500 MG/100ML IV SOLN
500.0000 mg | Freq: Three times a day (TID) | INTRAVENOUS | Status: DC
  Administered 2020-12-30: 500 mg via INTRAVENOUS
  Filled 2020-12-30: qty 100

## 2020-12-30 MED ORDER — ACETAMINOPHEN 650 MG RE SUPP
650.0000 mg | Freq: Four times a day (QID) | RECTAL | Status: DC | PRN
Start: 2020-12-30 — End: 2020-12-31

## 2020-12-30 MED ORDER — GLYCOPYRROLATE 0.2 MG/ML IJ SOLN
0.2000 mg | INTRAMUSCULAR | Status: DC | PRN
  Filled 2020-12-30: qty 1

## 2020-12-30 MED ORDER — MORPHINE SULFATE (PF) 2 MG/ML IV SOLN: 2.0000 mg | INTRAVENOUS | Status: DC | PRN

## 2020-12-30 MED ORDER — HALOPERIDOL 0.5 MG PO TABS
0.5000 mg | ORAL_TABLET | ORAL | Status: DC | PRN
Start: 2020-12-30 — End: 2020-12-31
  Filled 2020-12-30: qty 1

## 2020-12-30 MED ORDER — POLYVINYL ALCOHOL 1.4 % OP SOLN
1.0000 [drp] | Freq: Four times a day (QID) | OPHTHALMIC | Status: DC | PRN
  Filled 2020-12-30: qty 15

## 2020-12-30 MED ORDER — SODIUM CHLORIDE 0.9 % IV SOLN
2.0000 g | Freq: Three times a day (TID) | INTRAVENOUS | Status: DC
  Administered 2020-12-30: 2 g via INTRAVENOUS
  Filled 2020-12-30 (×2): qty 2

## 2020-12-30 MED ORDER — MORPHINE 100MG IN NS 100ML (1MG/ML) PREMIX INFUSION
2.0000 mg/h | INTRAVENOUS | Status: DC
  Administered 2020-12-30: 18:00:00 2 mg/h via INTRAVENOUS
  Filled 2020-12-30: qty 100

## 2020-12-30 MED ORDER — DEXAMETHASONE SODIUM PHOSPHATE 4 MG/ML IJ SOLN: 4.0000 mg | Freq: Four times a day (QID) | INTRAMUSCULAR | Status: DC

## 2020-12-30 MED ORDER — LORAZEPAM 2 MG/ML IJ SOLN
1.0000 mg | INTRAMUSCULAR | Status: AC | PRN
  Administered 2020-12-30: 1 mg via INTRAVENOUS
  Filled 2020-12-30: qty 1

## 2020-12-30 MED ORDER — HYDROCODONE-ACETAMINOPHEN 5-325 MG PO TABS: 1.0000 | ORAL_TABLET | ORAL | Status: DC | PRN

## 2020-12-30 MED ORDER — DEXAMETHASONE SODIUM PHOSPHATE 4 MG/ML IJ SOLN
4.0000 mg | Freq: Two times a day (BID) | INTRAMUSCULAR | Status: DC
  Administered 2020-12-30: 4 mg via INTRAVENOUS
  Filled 2020-12-30: qty 1

## 2020-12-30 MED ORDER — HALOPERIDOL LACTATE 2 MG/ML PO CONC
0.5000 mg | ORAL | Status: DC | PRN
  Filled 2020-12-30: qty 0.3

## 2020-12-30 MED ORDER — ONDANSETRON HCL 4 MG/2ML IJ SOLN: 4.0000 mg | Freq: Four times a day (QID) | INTRAMUSCULAR | Status: DC | PRN

## 2020-12-30 MED ORDER — ONDANSETRON HCL 4 MG PO TABS: 4.0000 mg | ORAL_TABLET | Freq: Four times a day (QID) | ORAL | Status: DC | PRN

## 2020-12-30 MED ORDER — HALOPERIDOL LACTATE 5 MG/ML IJ SOLN: 0.5000 mg | INTRAMUSCULAR | Status: DC | PRN

## 2020-12-30 MED ORDER — ACETAMINOPHEN 325 MG PO TABS: 650.0000 mg | ORAL_TABLET | Freq: Four times a day (QID) | ORAL | Status: DC | PRN

## 2020-12-30 MED ORDER — VANCOMYCIN HCL 1750 MG/350ML IV SOLN
1750.0000 mg | INTRAVENOUS | Status: DC
  Administered 2020-12-30: 1750 mg via INTRAVENOUS
  Filled 2020-12-30: qty 350

## 2020-12-30 MED ORDER — SENNOSIDES-DOCUSATE SODIUM 8.6-50 MG PO TABS: 1.0000 | ORAL_TABLET | Freq: Every evening | ORAL | Status: DC | PRN

## 2020-12-30 MED ORDER — INSULIN ASPART 100 UNIT/ML IJ SOLN
0.0000 [IU] | Freq: Three times a day (TID) | INTRAMUSCULAR | Status: DC
  Administered 2020-12-30: 3 [IU] via SUBCUTANEOUS
  Filled 2020-12-30: qty 1

## 2020-12-30 MED ORDER — LORAZEPAM 2 MG/ML IJ SOLN
1.0000 mg | INTRAMUSCULAR | Status: DC | PRN
  Administered 2020-12-30: 1 mg via INTRAVENOUS
  Filled 2020-12-30: qty 1

## 2020-12-30 MED ORDER — GLYCOPYRROLATE 1 MG PO TABS
1.0000 mg | ORAL_TABLET | ORAL | Status: DC | PRN
  Filled 2020-12-30: qty 1

## 2020-12-30 MED ORDER — HALOPERIDOL LACTATE 5 MG/ML IJ SOLN
1.0000 mg | Freq: Four times a day (QID) | INTRAMUSCULAR | Status: DC | PRN
  Administered 2020-12-30: 1 mg via INTRAVENOUS
  Filled 2020-12-30: qty 1

## 2020-12-30 MED ORDER — ACETAMINOPHEN 650 MG RE SUPP
650.0000 mg | Freq: Four times a day (QID) | RECTAL | Status: DC | PRN
  Administered 2020-12-30: 650 mg via RECTAL
  Filled 2020-12-30: qty 1

## 2020-12-30 MED ORDER — LEVETIRACETAM IN NACL 500 MG/100ML IV SOLN
500.0000 mg | Freq: Two times a day (BID) | INTRAVENOUS | Status: DC
  Administered 2020-12-30: 500 mg via INTRAVENOUS
  Filled 2020-12-30 (×3): qty 100

## 2020-12-30 MED ORDER — BIOTENE DRY MOUTH MT LIQD
15.0000 mL | OROMUCOSAL | Status: DC | PRN
  Filled 2020-12-30: qty 15

## 2020-12-31 LAB — URINE CULTURE: Culture: NO GROWTH

## 2020-12-31 NOTE — ED Notes (Signed)
This RN spoke with Dr. Maryland Pink on phone regarding patient bradycardia and desat after being given 1mg  Haldol IV as ordered. Per Dr. Maryland Pink, recheck BP in several minutes, no further orders at this time. Pt resting in bed with eyes closed, snoring respirations heard, pt remains on NRB at this time. Pt's wife remains at bedside at this time.

## 2020-12-31 NOTE — Consult Note (Signed)
[place]  Telephone:([phone] [phone]   Name: Alejandro Randolph Date: 2021/01/24 MRN: [id number]  DOB: 1946-01-01  Patient Care Team: [place] Pink, MD as PCP - General (Family Medicine) Ottie Glazier, MD as Consulting Physician (Pulmonary Disease) Telford Nab, RN as Oncology Nurse Navigator    REASON FOR CONSULTATION: Alejandro Randolph is a 75 y.o. 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  Patient was admitted to hospital on 2021-01-24 with altered mental status after falling at home.  Work-up including CT of the head, chest, abdomen, and pelvis were unrevealing.  Patient then developed a fever and was treated for sepsis.  Unfortunately, patient then decompensated with hypoxia and respiratory failure.  Family made decision to transition him to comfort care.  Palliative care was consulted to help manage ongoing symptoms.  SOCIAL HISTORY:     reports that he quit smoking about 21 years ago. He has never used smokeless tobacco. He reports that he does not drink alcohol and does not use drugs.  Patient is married and lives at home with his wife.  He has a son who is involved with his care.  ADVANCE DIRECTIVES:  Not on file  CODE STATUS: DNR  PAST MEDICAL HISTORY: Past Medical History:  Diagnosis Date  . Abscess of sigmoid colon   . Cancer [facility])    brain tumor  . Diabetes mellitus without complication ([place])   . GERD (gastroesophageal reflux disease)   . Hypercholesteremia   . Hypertension   . Peripheral vascular disease ([place])   . PUD (peptic ulcer disease)     PAST SURGICAL HISTORY:  Past Surgical History:  Procedure Laterality Date  . CHOLECYSTECTOMY    . COLONOSCOPY WITH PROPOFOL N/A 07/19/2016   Procedure: COLONOSCOPY WITH PROPOFOL;  Surgeon: Jonathon Bellows, MD;   Location: [facility] ORS;  Service: Endoscopy;  Laterality: N/A;  . COLONOSCOPY WITH PROPOFOL N/A 09/02/2016   Procedure: COLONOSCOPY WITH PROPOFOL;  Surgeon: Jonathon Bellows, MD;  Location: [facility] ENDOSCOPY;  Service: Endoscopy;  Laterality: N/A;  . COLONOSCOPY WITH PROPOFOL N/A 09/04/2020   Procedure: COLONOSCOPY WITH PROPOFOL;  Surgeon: Jonathon Bellows, MD;  Location: [facility] ENDOSCOPY;  Service: Gastroenterology;  Laterality: N/A;  . femoral stents Bilateral   . FLEXIBLE SIGMOIDOSCOPY  07/19/2016   Procedure: FLEXIBLE SIGMOIDOSCOPY;  Surgeon: Jonathon Bellows, MD;  Location: [facility] ORS;  Service: Endoscopy;;  . HERNIA REPAIR    . IR IMAGING GUIDED PORT INSERTION  10/10/2020  . RETINAL DETACHMENT SURGERY    . TONSILLECTOMY    . VIDEO BRONCHOSCOPY WITH ENDOBRONCHIAL ULTRASOUND N/A 09/01/2020   Procedure: VIDEO BRONCHOSCOPY WITH ENDOBRONCHIAL ULTRASOUND;  Surgeon: Ottie Glazier, MD;  Location: [facility] ORS;  Service: Thoracic;  Laterality: N/A;    HEMATOLOGY/ONCOLOGY HISTORY:  Oncology History Overview Note  # JAN 17th, 2022-Headaches/Vomitting [Dr.Brasington; ophthalmology brain MRI]-multiple enhancing lesions the brain- bilateral cerebellar and cerebral hemispheres, concerning for metastatic disease. The largest lesions are seen in the left cerebellar hemisphere (22 mm), right temporal lobe (26 mm) and left occipital lobe (29 mm) with prominent surrounding vasogenic edema causing effacement of the adjacent cerebral sulci. 2. Flattening of the bilateral optic disc, right greater than left, likely related to increased intracranial pressure. Right mastoid effusion.  PET scan- JAN 25th, 2022-Hypermetabolic mediastinal lymph nodes;  No significant metabolic activity associated with small RIGHT lower lobe pulmonary nodule. This nodule remains indeterminate;  No primary malignancy identified on skull base to thigh PET-CT; Scan; Hypermetabolic brain metastasis noted; bladder nodules [on CT scan]-  DIAGNOSIS:  A. LYMPH NODE, STATION  4R (RIGHT LOWER PARATRACHEAL); EBUS FNA:  - POSITIVE FOR MALIGNANCY.  - METASTATIC ADENOCARCINOMA, TTF1 POSITIVE.   #Lung cancer-adenocarcinoma TTF-1 positive [mediastinal lymph node biopsy;Dr.Aleskerov];   # 2/18- [finsihed WBRT]  # 10/13/2020- carbo-alimta-keytruda  #Bladder nodules [chronic; asymptomatic-no cystoscopy]; right colon uptake on PET scan- Dr.Anna colonoscopy-Feb2022- NEG.   # PVD [Dr.Schneir s/p stenting]  # NGS/MOLECULAR TESTS:OMNISEQ- PDL-1  <1%; TBM-H; Common targets-NEG; UNCOMMON- QNS    # PALLIATIVE CARE EVALUATION:  # PAIN MANAGEMENT:    DIAGNOSIS: Lung cancer  STAGE:   IV      ;  GOALS:pallaitive  CURRENT/MOST RECENT THERAPY :  WBRT     Brain metastasis ([place])  08/21/2020 Initial Diagnosis   Brain metastasis ([facility])   Primary cancer of right upper lobe of lung ([place])  09/06/2020 Initial Diagnosis   Primary cancer of right upper lobe of lung ([place])   09/06/2020 Cancer Staging   Staging form: Lung, AJCC 8th Edition - Clinical: Stage IVB (cT1, cN3, pM1c) - Signed by Cammie Sickle, MD on 09/06/2020   10/13/2020 -  Chemotherapy    Patient is on Treatment Plan: LUNG CARBOPLATIN / PEMETREXED / PEMBROLIZUMAB Q21D INDUCTION X 4 CYCLES / MAINTENANCE PEMETREXED + PEMBROLIZUMAB        ALLERGIES:  is allergic to atorvastatin.  MEDICATIONS:  Current Facility-Administered Medications  Medication Dose Route Frequency Provider Last Rate Last Admin  . acetaminophen (TYLENOL) tablet 650 mg  650 mg Oral Q6H PRN Bonnielee Haff, MD       Or  . acetaminophen (TYLENOL) suppository 650 mg  650 mg Rectal Q6H PRN Bonnielee Haff, MD      . antiseptic oral rinse (BIOTENE) solution 15 mL  15 mL Topical PRN Bonnielee Haff, MD      . glycopyrrolate (ROBINUL) tablet 1 mg  1 mg Oral Q4H PRN Bonnielee Haff, MD       Or  . glycopyrrolate (ROBINUL) injection 0.2 mg  0.2 mg Subcutaneous Q4H PRN Bonnielee Haff, MD       Or  . glycopyrrolate (ROBINUL) injection 0.2 mg  0.2  mg Intravenous Q4H PRN Bonnielee Haff, MD      . haloperidol (HALDOL) tablet 0.5 mg  0.5 mg Oral Q4H PRN Bonnielee Haff, MD       Or  . haloperidol (HALDOL) 2 MG/ML solution 0.5 mg  0.5 mg Sublingual Q4H PRN Bonnielee Haff, MD       Or  . haloperidol lactate (HALDOL) injection 0.5 mg  0.5 mg Intravenous Q4H PRN Bonnielee Haff, MD      . morphine 2 MG/ML injection 2-4 mg  2-4 mg Intravenous Q1H PRN Marcie Shearon, Kirt Boys, NP   2 mg at 01-08-21 1116  . ondansetron (ZOFRAN-ODT) disintegrating tablet 4 mg  4 mg Oral Q6H PRN Bonnielee Haff, MD       Or  . ondansetron [facility]) injection 4 mg  4 mg Intravenous Q6H PRN Bonnielee Haff, MD      . polyvinyl alcohol (LIQUIFILM TEARS) 1.4 % ophthalmic solution 1 drop  1 drop Both Eyes QID PRN Bonnielee Haff, MD       Current Outpatient Medications  Medication Sig Dispense Refill  . amLODipine (NORVASC) 10 MG tablet Take 10 mg by mouth daily.    Marland Kitchen aspirin EC 81 MG tablet Take 81 mg by mouth daily.    Marland Kitchen  fluticasone (FLONASE) 50 MCG/ACT nasal spray Place 1 spray into both nostrils daily as needed for allergies.    . folic acid (FOLVITE) 1 MG tablet Take 1 tablet (1 mg total) by mouth daily. 90 tablet 1  . glipiZIDE (GLUCOTROL) 5 MG tablet TAKE 1 TABLET BY MOUTH ONCE A DAY BEFOREBREAKFAST 30 tablet 0  . glucose blood (ONETOUCH ULTRA) test strip USE AS DIRECTED TWICE A DAY TO CHECK BLOOD SUGAR    . lidocaine-prilocaine (EMLA) cream Apply 1 application topically as needed. Apply 30-45 mins prior to port access. 30 g 3  . metFORMIN (GLUCOPHAGE-XR) 500 MG 24 hr tablet Take 500 mg by mouth 2 (two) times daily.    . Omega-3 Fatty Acids (OMEGA-3 FISH OIL) 500 MG CAPS Take 500 mg by mouth daily.    . ondansetron (ZOFRAN) 8 MG tablet One pill every 8 hours as needed for nausea/vomitting. 40 tablet 1  . pantoprazole (PROTONIX) 40 MG tablet Take 40 mg by mouth daily.    . potassium chloride (KLOR-CON) 10 MEQ tablet Take 1 tablet (10 mEq total) by mouth daily. 21  tablet 0  . ROCKLATAN 0.02-0.005 % SOLN Place 1 drop into the left eye daily.    . rosuvastatin (CRESTOR) 5 MG tablet Take 5 mg by mouth at bedtime.    . Saw Palmetto, Serenoa repens, (SAW PALMETTO PO) Take 450 mg by mouth daily.      VITAL SIGNS: BP (!) 148/85   Pulse (!) 120   Temp 98.7 F (37.1 C) (Axillary)   Resp (!) 29   Ht _0  (1.778 m)   Wt 200 lb (90.7 kg)   SpO2 90%   BMI 28.70 kg/m  Filed Weights   12/26/2020 2049  Weight: 200 lb (90.7 kg)    Estimated body mass index is 28.7 kg/m as calculated from the following:   Height as of this encounter: _1  (1.778 m).   Weight as of this encounter: 200 lb (90.7 kg).  LABS: CBC:    Component Value Date/Time   WBC 2.6 (L) 01/03/2021 0432   HGB 11.0 (L) 01-03-21 0432   HCT 30.9 (L) 01/03/2021 0432   PLT 97 (L) 01-03-2021 0432   MCV 96.0 01/03/21 0432   NEUTROABS 2.1 01-03-2021 0432   LYMPHSABS 0.3 (L) 01/03/2021 0432   MONOABS 0.2 01/03/2021 0432   EOSABS 0.0 01/03/21 0432   BASOSABS 0.0 January 03, 2021 0432   Comprehensive Metabolic Panel:    Component Value Date/Time   NA 136 01/03/2021 0432   NA 142 05/02/2012 0812   K 3.7 2021-01-03 0432   K 3.9 05/02/2012 0812   CL 102 01/03/2021 0432   CL 107 05/02/2012 0812   CO2 22 01-03-2021 0432   CO2 29 05/02/2012 0812   BUN 13 Jan 03, 2021 0432   BUN 12 05/02/2012 0812   CREATININE 0.99 2021-01-03 0432   CREATININE 0.88 05/02/2012 0812   GLUCOSE 222 (H) January 03, 2021 0432   GLUCOSE 137 (H) 05/02/2012 0812   CALCIUM 10.2 Jan 03, 2021 0432   CALCIUM 9.8 05/02/2012 0812   AST 23 2021/01/03 0432   ALT 22 03-Jan-2021 0432   ALKPHOS 49 01/03/21 0432   BILITOT 0.8 2021-01-03 0432   PROT 7.2 01-03-21 0432   ALBUMIN 4.0 January 03, 2021 0432    RADIOGRAPHIC STUDIES: CT Head Wo Contrast  Result Date: 12/19/2020 CLINICAL DATA:  Stage IV lung cancer with intracranial metastases, fell EXAM: CT HEAD WITHOUT CONTRAST TECHNIQUE: Contiguous axial images were obtained from  the base of  the skull through the vertex without intravenous contrast. COMPARISON:  11/20/2020 FINDINGS: Brain: Patient motion throughout the exam limits evaluation. Hypodensities within the left cerebellar hemisphere, right temporal lobe, left occipital lobe correspond areas of vasogenic edema seen on recent MRI, compatible with known intracranial metastases. No signs of acute infarct or hemorrhage. The lateral ventricles and midline structures are unremarkable. No acute extra-axial fluid collections. Vascular: No hyperdense vessel or unexpected calcification. Skull: Normal. Negative for fracture or focal lesion. Sinuses/Orbits: Chronic bilateral mastoid effusions. The paranasal sinuses are unremarkable. Other: None. IMPRESSION: 1. Limited study due to patient motion. 2. No acute infarct or hemorrhage. 3. Stable areas of vasogenic edema within the left cerebellar hemisphere, right temporal lobe, and left occipital lobe, compatible with known intracranial metastases. No significant change since recent MRI. Electronically Signed   By: Randa Ngo M.D.   On: 12/15/2020 21:40   CT Chest W Contrast  Result Date: 12/12/2020 CLINICAL DATA:  Metastatic right lung cancer, assess treatment response EXAM: CT CHEST WITH CONTRAST TECHNIQUE: Multidetector CT imaging of the chest was performed during intravenous contrast administration. CONTRAST:  40m OMNIPAQUE IOHEXOL 300 MG/ML  SOLN COMPARISON:  PET-CT, 08/26/2020, CT chest abdomen pelvis, 08/21/2020 FINDINGS: Cardiovascular: Right chest port catheter. Aortic atherosclerosis. Aortic valve calcifications. Normal heart size. Left and right coronary artery calcifications. Trace pericardial effusion. Mediastinum/Nodes: Interval decrease in size of enlarged subcarinal node, measuring 3.1 x 0.8 cm, previously 3.0 x 1.8 cm when measured similarly (series 2, image 72). Interval resolution of a previously enlarged pretracheal lymph node, measuring no greater than 0.8 x 0.6 cm,  previously 2.0 x 1.6 cm (series 2, image 47). Thyroid gland, trachea, and esophagus demonstrate no significant findings. Lungs/Pleura: No significant change in a small subpleural pulmonary nodule of the peripheral posterior right upper lobe measuring 0.7 cm (series 3, image 48). There is a new nodule of the very inferior posterior right upper lobe abutting the major and minor fissures measuring 0.7 cm (series 3, image 76). There are numerous new nodules in the left upper lobe and lingula, for example a 7 mm nodule of the posterior lingula (series 3, image 78) and a 9 mm nodule of the dependent left lower lobe (series 3, image 93). Mild fibrosis of the bilateral lung bases. No pleural effusion or pneumothorax. Upper Abdomen: No acute abnormality. Redemonstrated benign, non FDG avid right adrenal adenoma. Musculoskeletal: No chest wall mass or suspicious bone lesions identified. IMPRESSION: 1. Significant interval decrease in size of enlarged, FDG avid mediastinal lymph nodes, consistent with treatment response. 2. A previously seen small subpleural nodule of the peripheral posterior right upper lobe is unchanged and remains nonspecific. 3. There are numerous new small pulmonary nodules, the majority present in the dependent left lower lobe. These are nonspecific although distribution generally suggests an infectious or inflammatory etiology. Metastatic disease is however a substantial differential consideration. 4. Coronary artery disease. Aortic Atherosclerosis (ICD10-I70.0). Electronically Signed   By: AEddie CandleM.D.   On: 12/12/2020 09:55   CT Cervical Spine Wo Contrast  Result Date: 12/28/2020 CLINICAL DATA:  Stage IV lung cancer with intracranial metastases, fell EXAM: CT CERVICAL SPINE WITHOUT CONTRAST TECHNIQUE: Multidetector CT imaging of the cervical spine was performed without intravenous contrast. Multiplanar CT image reconstructions were also generated. COMPARISON:  None. FINDINGS: Alignment:  Alignment is grossly anatomic. Skull base and vertebrae: Evaluation of the cervical vertebral bodies is severely limited due to patient motion, especially from the skull base through the C2 level. I do not see  any acute or destructive bony lesions. Soft tissues and spinal canal: No prevertebral fluid or swelling. No visible canal hematoma. Disc levels: Prominent facet hypertrophic changes at C2-3 and C3-4, left predominant. Mild lower cervical disc space narrowing. Upper chest: Central airway is patent.  Lung apices are clear. Other: Reconstructed images demonstrate no additional findings. IMPRESSION: 1. Limited evaluation of the upper cervical spine and skull base due to extensive patient motion. 2. No acute displaced fractures. 3. Mild cervical degenerative changes as above. Electronically Signed   By: Randa Ngo M.D.   On: 12/28/2020 21:42   CT T-SPINE NO CHARGE  Result Date: 01-17-2021 CLINICAL DATA:  Initial evaluation for acute trauma, fall. EXAM: CT THORACIC AND LUMBAR SPINE WITHOUT CONTRAST TECHNIQUE: Multidetector CT imaging of the thoracic and lumbar spine was performed without contrast. Multiplanar CT image reconstructions were also generated. COMPARISON:  Comparison made with concomitant CT of the chest, abdomen, and pelvis. FINDINGS: CT THORACIC SPINE FINDINGS Alignment: Examination mildly degraded by motion artifact. Exaggeration of the normal thoracic kyphosis with underlying trace scoliotic curvature. No significant listhesis. Vertebrae: Vertebral body height maintained without acute or chronic fracture. Visualized ribs intact. No discrete osseous lesions. Paraspinal and other soft tissues: Paraspinous soft tissues demonstrate no acute finding. Scattered pulmonary nodules noted within the partially visualized lungs, better evaluated on concomitant chest CT. Aortic atherosclerosis. Scattered coronary artery calcifications. Hiatal hernia. Disc levels: No visible significant disc bulge or focal  disc herniation. No significant spinal stenosis. Mild degenerative endplate spurring noted within the mid and lower thoracic spine. CT LUMBAR SPINE FINDINGS Segmentation: Is standard. Lowest well-formed disc space labeled the L5-S1 level. Alignment: Physiologic with preservation of the normal lumbar lordosis. No listhesis. Vertebrae: Vertebral body height maintained without acute or chronic fracture. Visualized sacrum and pelvis intact. SI joints symmetric and normal. No worrisome osseous lesions. Paraspinal and other soft tissues: Paraspinous soft tissues demonstrate no acute finding. Advanced aorto bi-iliac atherosclerotic disease. Iliac stents in place. Right adrenal adenoma noted. Few scattered benign appearing cyst noted about the visualized kidneys. Disc levels: L1-2: Degenerative intervertebral disc space narrowing with mild disc bulge. Mild reactive endplate spurring. No spinal stenosis. Foramina remain patent. L2-3: Disc bulge with mild reactive endplate spurring. Disc bulge slightly eccentric to the right. Moderate right with mild left facet hypertrophy. No significant spinal stenosis. Mild right worse than left L2 foraminal stenosis. L3-4: Mild disc bulge with minimal reactive endplate spurring. Disc bulge slightly asymmetric to the right. Mild facet hypertrophy. No significant spinal stenosis. Foramina remain patent. L4-5: Minimal disc bulge. Mild to moderate bilateral facet hypertrophy. No significant spinal stenosis. Mild left L4 foraminal narrowing. Right neural foramina remains patent. L5-S1: Degenerative intervertebral disc space narrowing with disc desiccation and diffuse disc bulge. Reactive endplate spurring. Mild facet hypertrophy. No significant spinal stenosis. Moderate bilateral L5 foraminal narrowing. IMPRESSION: 1. No acute traumatic injury within the thoracic or lumbar spine. 2. Mild multilevel degenerative spondylosis without significant spinal stenosis moderate bilateral L5 foraminal  stenosis. 3. Aortic Atherosclerosis (ICD10-I70.0). Electronically Signed   By: Jeannine Boga M.D.   On: 01-17-21 01:39   CT L-SPINE NO CHARGE  Result Date: 01/17/2021 CLINICAL DATA:  Initial evaluation for acute trauma, fall. EXAM: CT THORACIC AND LUMBAR SPINE WITHOUT CONTRAST TECHNIQUE: Multidetector CT imaging of the thoracic and lumbar spine was performed without contrast. Multiplanar CT image reconstructions were also generated. COMPARISON:  Comparison made with concomitant CT of the chest, abdomen, and pelvis. FINDINGS: CT THORACIC SPINE FINDINGS Alignment: Examination mildly degraded  by motion artifact. Exaggeration of the normal thoracic kyphosis with underlying trace scoliotic curvature. No significant listhesis. Vertebrae: Vertebral body height maintained without acute or chronic fracture. Visualized ribs intact. No discrete osseous lesions. Paraspinal and other soft tissues: Paraspinous soft tissues demonstrate no acute finding. Scattered pulmonary nodules noted within the partially visualized lungs, better evaluated on concomitant chest CT. Aortic atherosclerosis. Scattered coronary artery calcifications. Hiatal hernia. Disc levels: No visible significant disc bulge or focal disc herniation. No significant spinal stenosis. Mild degenerative endplate spurring noted within the mid and lower thoracic spine. CT LUMBAR SPINE FINDINGS Segmentation: Is standard. Lowest well-formed disc space labeled the L5-S1 level. Alignment: Physiologic with preservation of the normal lumbar lordosis. No listhesis. Vertebrae: Vertebral body height maintained without acute or chronic fracture. Visualized sacrum and pelvis intact. SI joints symmetric and normal. No worrisome osseous lesions. Paraspinal and other soft tissues: Paraspinous soft tissues demonstrate no acute finding. Advanced aorto bi-iliac atherosclerotic disease. Iliac stents in place. Right adrenal adenoma noted. Few scattered benign appearing cyst  noted about the visualized kidneys. Disc levels: L1-2: Degenerative intervertebral disc space narrowing with mild disc bulge. Mild reactive endplate spurring. No spinal stenosis. Foramina remain patent. L2-3: Disc bulge with mild reactive endplate spurring. Disc bulge slightly eccentric to the right. Moderate right with mild left facet hypertrophy. No significant spinal stenosis. Mild right worse than left L2 foraminal stenosis. L3-4: Mild disc bulge with minimal reactive endplate spurring. Disc bulge slightly asymmetric to the right. Mild facet hypertrophy. No significant spinal stenosis. Foramina remain patent. L4-5: Minimal disc bulge. Mild to moderate bilateral facet hypertrophy. No significant spinal stenosis. Mild left L4 foraminal narrowing. Right neural foramina remains patent. L5-S1: Degenerative intervertebral disc space narrowing with disc desiccation and diffuse disc bulge. Reactive endplate spurring. Mild facet hypertrophy. No significant spinal stenosis. Moderate bilateral L5 foraminal narrowing. IMPRESSION: 1. No acute traumatic injury within the thoracic or lumbar spine. 2. Mild multilevel degenerative spondylosis without significant spinal stenosis moderate bilateral L5 foraminal stenosis. 3. Aortic Atherosclerosis (ICD10-I70.0). Electronically Signed   By: Jeannine Boga M.D.   On: January 21, 2021 01:39   DG Chest Portable 1 View  Result Date: 12/15/2020 CLINICAL DATA:  75 year old male with altered mental status. EXAM: PORTABLE CHEST 1 VIEW COMPARISON:  Chest CT dated 12/11/2020. FINDINGS: Right-sided Port-A-Cath with tip at the cavoatrial junction. There is shallow inspiration. Diffuse interstitial prominence and bibasilar reticular scarring. No focal consolidation, pleural effusion, or pneumothorax. Mild cardiomegaly. No acute osseous pathology. IMPRESSION: 1. No acute cardiopulmonary process. 2. Mild cardiomegaly. Electronically Signed   By: Anner Crete M.D.   On: 12/12/2020 21:15    CT CHEST ABDOMEN PELVIS WO CONTRAST  Result Date: January 21, 2021 CLINICAL DATA:  Stage IV lung cancer with intracranial metastases, fell, altered level of consciousness EXAM: CT CHEST, ABDOMEN AND PELVIS WITHOUT CONTRAST TECHNIQUE: Multidetector CT imaging of the chest, abdomen and pelvis was performed following the standard protocol without IV contrast. Unenhanced CT was performed per clinician order. Lack of IV contrast limits sensitivity and specificity, especially for evaluation of vascular structures and abdominal/pelvic solid viscera. COMPARISON:  12/11/2020 FINDINGS: CT CHEST FINDINGS Cardiovascular: Unenhanced imaging of the heart and great vessels demonstrates no pericardial effusion. Normal caliber of the thoracic aorta. Extensive atherosclerosis of the aorta and coronary vessels. Mediastinum/Nodes: 8 mm short axis subcarinal lymph node is unchanged. No new lymphadenopathy. Thyroid, trachea, and esophagus are unremarkable. Stable small hiatal hernia. Lungs/Pleura: No acute airspace disease, effusion, or pneumothorax. Stable 7 mm subpleural right upper lobe pulmonary  nodule, reference image 48/4. The right upper lobe subpleural nodule at the junction of the major and minor fissures on prior study is not identified on this exam. 7 mm lingular nodule image 79/4 and and 8 mm left lower lobe pulmonary nodule image 94/4 are unchanged. Stable subpleural scarring and fibrosis at the lung bases. Central airways are patent. Musculoskeletal: No acute or destructive bony lesions. Reconstructed images demonstrate no additional findings. CT ABDOMEN PELVIS FINDINGS Hepatobiliary: Gallbladder is surgically absent. Unenhanced imaging of the liver demonstrates no gross abnormalities. Pancreas: Unremarkable. No pancreatic ductal dilatation or surrounding inflammatory changes. Spleen: Normal in size without focal abnormality. Adrenals/Urinary Tract: Stable 2.7 cm right adrenal nodule previously shown to have imaging  characteristics compatible with adenoma. The left adrenal is grossly normal. Bilateral renal cortical cysts are identified. No urinary tract calculi or obstructive uropathy. Asymmetric wall thickening along the left posterolateral aspect of the bladder again seen, measuring 6 mm. Stomach/Bowel: There is chronic wall thickening of the mid sigmoid colon, consistent with scarring from prior inflammation. Diffuse diverticulosis of the distal colon without evidence of acute diverticulitis. No bowel obstruction or ileus. Vascular/Lymphatic: Extensive atherosclerosis of the aorta. Bilateral common iliac artery stents are noted. No pathologic adenopathy. Reproductive: Prostate is enlarged but stable. Other: No free fluid or free gas.  No abdominal wall hernia. Musculoskeletal: No acute or destructive bony lesions. Reconstructed images demonstrate no additional findings. IMPRESSION: 1. Stable bilateral subcentimeter pulmonary nodules. These remain indeterminate, and close attention on follow-up is recommended to exclude metastatic disease. 2. Stable subcentimeter subcarinal lymph node. No pathologic adenopathy. 3. Chronic wall thickening of the left posterolateral aspect of the bladder, unchanged since 2017. If not previously performed, cystoscopy may be useful. 4. Chronic wall thickening of the mid sigmoid colon, consistent with scarring after previous bouts of inflammation. No evidence of acute inflammation or diverticulitis on today's exam. 5. Stable right adrenal adenoma. 6.  Aortic Atherosclerosis (ICD10-I70.0). Electronically Signed   By: Randa Ngo M.D.   On: 01-13-21 00:13    PERFORMANCE STATUS (ECOG) : 4 - Bedbound  Review of Systems Unable to complete.  Physical Exam General: Critically ill-appearing Cardiovascular: Tachycardic Pulmonary: Sonorous, tachypneic Abdomen: soft Extremities: no edema, no joint deformities Skin: no rashes Neurological: Unresponsive  IMPRESSION: I met with patient's  wife, son, daughter-in-law, and mother in the ER.  Unfortunately, patient has clinically decompensated overnight.  He is now unresponsive and hypoxic on nonrebreather.  His respirations are labored and tachypneic and patient appears clinically uncomfortable.  Per chart, patient was having runs of bradycardia and V. tach overnight.  Wife confirms that family has decided just to focus on keeping patient comfortable.  Patient appears to be active in the dying process.  Wife confirms DNR/DNI.  Discussed comfort orders with hospitalist and nurse.  Will add morphine for pain/dyspnea.  Consider transitioning patient off nonrebreather for comfort.  Emotional support provided to family.  They have called and their pastor and extended family.  PLAN: -Comfort care as previously ordered by Dr. Maryland Pink -Start morphine IV as needed for pain/dyspnea -Anticipate in-hospital death -DNR/DNI  Case and plan discussed with Drs. Caleb Popp and Swartz   Time Total: 60 minutes  Visit consisted of counseling and education dealing with the complex and emotionally intense issues of symptom management and palliative care in the setting of serious and potentially life-threatening illness.Greater than 50%  of this time was spent counseling and coordinating care related to the above assessment and plan.  Signed by: Altha Harm, PhD,  NP-C

## 2020-12-31 NOTE — ED Notes (Signed)
This RN spoke with Dr. Maryland Pink regarding patient's agitation, VORB with MD at computer to verify orders for 1mg  IV Ativan q3hrs PRN for agitation.

## 2020-12-31 NOTE — ED Notes (Signed)
Dr. Maryland Pink paged regarding change in patient condition, notified that pulse ox site changed, on 2L O2 85-86%, on 5L via [place] O2 noted to be 83-84%, pt placed on NRB, pt also noted to be mouth breathing. Dr. Maryland Pink made aware. Also made aware that patient's agitation not improved with ativan, orders placed by Dr. Maryland Pink regarding same. This RN also spoke with MD regarding concerns over patient's agitation in regarding to LP, per Dr. Maryland Pink, will cancel DG LP due to patient's agitation at this time and inability to lay still.

## 2020-12-31 NOTE — Progress Notes (Signed)
Body prepped for morgue

## 2020-12-31 NOTE — Progress Notes (Signed)
Pharmacy Antibiotic Note  Alejandro Randolph is a 75 y.o. male admitted on 12/10/2020 with sepsis from unknown source.  Pharmacy has been consulted for Cefepime and Vancomycin dosing.  Pt is also ordered Flagyl at this time.  Plan: Ordered Cefepime 2 gm q8h per indication and renal fxn.  Vancomycin Vancomycin 1750 mg IV Q 24 hrs.  Goal AUC 400-550. Expected AUC: 469.4 SCr used: 1.07  Pharmacy will continue to follow and adjust abx dosing when warranted.   Height: 5\' 10"  (177.8 cm) Weight: 90.7 kg (200 lb) IBW/kg (Calculated) : 73  Temp (24hrs), Avg:101 F (38.3 C), Min:98.7 F (37.1 C), Max:102.6 F (39.2 C)  Recent Labs  Lab 12/28/2020 2057  WBC 4.4  CREATININE 1.07    Estimated Creatinine Clearance: 69.7 mL/min (by C-G formula based on SCr of 1.07 mg/dL).    Allergies  Allergen Reactions  . Atorvastatin Rash    Hip pain    Antimicrobials this admission: 5/31 Cefepime >>  5/31 Vancomcyin >>  5/31 Flagyl >>  Microbiology results: 5/30 BCx: Pending 5/30 UCx: Pending    Thank you for allowing pharmacy to be a part of this patient's care.  Renda Rolls, PharmD, [facility] Jan 11, 2021 4:22 AM

## 2020-12-31 NOTE — ED Notes (Signed)
With assistance of patient wife, pt linen changed. Patient diaphoretic. Rectal temp 102.6. External catheter hooked to suction. Patient given medication for agitation with no change.

## 2020-12-31 NOTE — Consult Note (Signed)
[place] NOTE  Patient Care Team: [place] Pink, MD as PCP - General (Family Medicine) Ottie Glazier, MD as Consulting Physician (Pulmonary Disease) Telford Nab, RN as Oncology Nurse Navigator  CHIEF COMPLAINTS/PURPOSE OF CONSULTATION: Metastatic lung cancer  HISTORY OF PRESENTING ILLNESS:  Alejandro Randolph 75 y.o.  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  As per the family patient had been slightly confused on the day of admission-followed by fall.  Patient was clinically stable on admission except for mild tachycardia.  CT scan brain showed stable areas of vasogenic edema compared to recent MRI in April 2022.  CT chest and pelvis no acute findings.  However, patient rapidly noticed to have worsening mental status changes with development of fever; and worsening respiratory status.  The next few hours patient got agitated.  Patient has been requiring increasing oxygen requirements.   However after discussion with the primary service patient made comfort measures.   Review of Systems  Unable to perform ROS: Mental acuity     MEDICAL HISTORY:  Past Medical History:  Diagnosis Date  . Abscess of sigmoid colon   . Cancer [facility])    brain tumor  . Diabetes mellitus without complication ([place])   . GERD (gastroesophageal reflux disease)   . Hypercholesteremia   . Hypertension   . Peripheral vascular disease ([place])   . PUD (peptic ulcer disease)     SURGICAL HISTORY: Past Surgical History:  Procedure Laterality Date  . CHOLECYSTECTOMY    . COLONOSCOPY WITH PROPOFOL N/A 07/19/2016   Procedure: COLONOSCOPY WITH PROPOFOL;  Surgeon: Jonathon Bellows, MD;  Location: [facility] ORS;  Service: Endoscopy;  Laterality: N/A;  . COLONOSCOPY WITH PROPOFOL N/A 09/02/2016   Procedure: COLONOSCOPY WITH PROPOFOL;  Surgeon: Jonathon Bellows, MD;  Location: [facility] ENDOSCOPY;  Service:  Endoscopy;  Laterality: N/A;  . COLONOSCOPY WITH PROPOFOL N/A 09/04/2020   Procedure: COLONOSCOPY WITH PROPOFOL;  Surgeon: Jonathon Bellows, MD;  Location: [facility] ENDOSCOPY;  Service: Gastroenterology;  Laterality: N/A;  . femoral stents Bilateral   . FLEXIBLE SIGMOIDOSCOPY  07/19/2016   Procedure: FLEXIBLE SIGMOIDOSCOPY;  Surgeon: Jonathon Bellows, MD;  Location: [facility] ORS;  Service: Endoscopy;;  . HERNIA REPAIR    . IR IMAGING GUIDED PORT INSERTION  10/10/2020  . RETINAL DETACHMENT SURGERY    . TONSILLECTOMY    . VIDEO BRONCHOSCOPY WITH ENDOBRONCHIAL ULTRASOUND N/A 09/01/2020   Procedure: VIDEO BRONCHOSCOPY WITH ENDOBRONCHIAL ULTRASOUND;  Surgeon: Ottie Glazier, MD;  Location: [facility] ORS;  Service: Thoracic;  Laterality: N/A;    SOCIAL HISTORY: Social History   Socioeconomic History  . Marital status: Married    Spouse name: Katharine Look  . Number of children: Not on file  . Years of education: Not on file  . Highest education level: Not on file  Occupational History    Comment: retired  Tobacco Use  . Smoking status: Former Smoker    Quit date: 2001    Years since quitting: 21.4  . Smokeless tobacco: Never Used  Vaping Use  . Vaping Use: Never used  Substance and Sexual Activity  . Alcohol use: No  . Drug use: No  . Sexual activity: Not on file  Other Topics Concern  . Not on file  Social History Narrative   Lives in [place]; lives with wife; 1 son- lives close by.[id number]- Quit smoking- 2001. No Alcohol.    Feels safe in his home.   Social  Determinants of Health   Financial Resource Strain: Not on file  Food Insecurity: Not on file  Transportation Needs: Not on file  Physical Activity: Not on file  Stress: Not on file  Social Connections: Not on file  Intimate Partner Violence: Not on file    FAMILY HISTORY: Family History  Problem Relation Age of Onset  . Diabetes Mother   . Hypertension Mother   . Diabetes Father     ALLERGIES:  is allergic to atorvastatin.  MEDICATIONS:   Current Facility-Administered Medications  Medication Dose Route Frequency Provider Last Rate Last Admin  . acetaminophen (TYLENOL) tablet 650 mg  650 mg Oral Q6H PRN Bonnielee Haff, MD       Or  . acetaminophen (TYLENOL) suppository 650 mg  650 mg Rectal Q6H PRN Bonnielee Haff, MD      . antiseptic oral rinse (BIOTENE) solution 15 mL  15 mL Topical PRN Bonnielee Haff, MD      . glycopyrrolate (ROBINUL) tablet 1 mg  1 mg Oral Q4H PRN Bonnielee Haff, MD       Or  . glycopyrrolate (ROBINUL) injection 0.2 mg  0.2 mg Subcutaneous Q4H PRN Bonnielee Haff, MD       Or  . glycopyrrolate (ROBINUL) injection 0.2 mg  0.2 mg Intravenous Q4H PRN Bonnielee Haff, MD      . haloperidol (HALDOL) tablet 0.5 mg  0.5 mg Oral Q4H PRN Bonnielee Haff, MD       Or  . haloperidol (HALDOL) 2 MG/ML solution 0.5 mg  0.5 mg Sublingual Q4H PRN Bonnielee Haff, MD       Or  . haloperidol lactate (HALDOL) injection 0.5 mg  0.5 mg Intravenous Q4H PRN Bonnielee Haff, MD      . morphine 100mg  in NS 171mL (1mg /mL) infusion - premix  2 mg/hr Intravenous Continuous Bonnielee Haff, MD 2 mL/hr at 01-19-2021 1741 2 mg/hr at 01/19/2021 1741  . morphine 2 MG/ML injection 2-4 mg  2-4 mg Intravenous Q1H PRN Borders, Kirt Boys, NP   4 mg at 2021/01/19 1350  . ondansetron (ZOFRAN-ODT) disintegrating tablet 4 mg  4 mg Oral Q6H PRN Bonnielee Haff, MD       Or  . ondansetron [facility]) injection 4 mg  4 mg Intravenous Q6H PRN Bonnielee Haff, MD      . polyvinyl alcohol (LIQUIFILM TEARS) 1.4 % ophthalmic solution 1 drop  1 drop Both Eyes QID PRN Bonnielee Haff, MD          .  PHYSICAL EXAMINATION:  Vitals:   01/19/21 1100 19-Jan-2021 1503  BP: (!) 148/85 (!) 133/57  Pulse: (!) 120 68  Resp: (!) 29 16  Temp:  98.1 F (36.7 C)  SpO2: 90% 92%   Filed Weights   12/10/2020 2049 Jan 19, 2021 1807  Weight: 90.7 kg 90.7 kg    Physical Exam Constitutional:      Comments: Patient appears sick; respiratory distress; on 14 L of oxygen   HENT:     Head: Normocephalic and atraumatic.     Mouth/Throat:     Pharynx: No oropharyngeal exudate.  Cardiovascular:     Rate and Rhythm: Normal rate and regular rhythm.  Pulmonary:     Effort: No respiratory distress.     Breath sounds: No wheezing.     Comments: Bilateral coarse breath sounds. Abdominal:     General: Bowel sounds are normal. There is no distension.     Palpations: Abdomen is soft. There is no mass.  Tenderness: There is no abdominal tenderness. There is no guarding or rebound.  Musculoskeletal:        General: No tenderness. Normal range of motion.     Cervical back: Normal range of motion and neck supple.  Skin:    General: Skin is warm.  Neurological:     Comments: Sedated (morphine)  Psychiatric:        Mood and Affect: Affect normal.      LABORATORY DATA:  I have reviewed the data as listed Lab Results  Component Value Date   WBC 2.6 (L) 01/26/21   HGB 11.0 (L) January 26, 2021   HCT 30.9 (L) 01/26/21   MCV 96.0 Jan 26, 2021   PLT 97 (L) 2021-01-26   Recent Labs    12/15/20 0815 12/12/2020 2057 01-26-2021 0432  NA 139 138 136  K 3.2* 3.7 3.7  CL 105 103 102  CO2 25 22 22   GLUCOSE 146* 153* 222*  BUN 11 13 13   CREATININE 0.97 1.07 0.99  CALCIUM 10.0 10.3 10.2  GFRNONAA >60 >60 >60  PROT 6.8 7.2 7.2  ALBUMIN 3.6 4.1 4.0  AST 23 24 23   ALT 16 21 22   ALKPHOS 42 49 49  BILITOT 0.7 0.8 0.8    RADIOGRAPHIC STUDIES: I have personally reviewed the radiological images as listed and agreed with the findings in the report. 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 COMPARISON:  11/20/2020 FINDINGS: Brain: Patient motion throughout the exam limits evaluation. Hypodensities within the left cerebellar hemisphere, right  temporal lobe, left occipital lobe correspond areas of vasogenic edema seen on recent MRI, compatible with known intracranial metastases. No signs of acute infarct or hemorrhage. The lateral ventricles and midline structures are unremarkable. No acute extra-axial fluid collections. Vascular: No hyperdense vessel or unexpected calcification. Skull: Normal. Negative for fracture or focal lesion. Sinuses/Orbits: Chronic bilateral mastoid effusions. The paranasal sinuses are unremarkable. Other: None. IMPRESSION: 1. Limited study due to patient motion. 2. No acute infarct or hemorrhage. 3. Stable areas of vasogenic edema within the left cerebellar hemisphere, right temporal lobe, and left occipital lobe, compatible with known intracranial metastases. No significant change since recent MRI. Electronically Signed   By: Randa Ngo M.D.   On: 12/17/2020 21:40   CT Chest W Contrast  Result Date: 12/12/2020 CLINICAL DATA:  Metastatic right lung cancer, assess treatment response EXAM: CT CHEST WITH CONTRAST TECHNIQUE: Multidetector CT imaging of the chest was performed during intravenous contrast administration. CONTRAST:  14mL OMNIPAQUE IOHEXOL 300 MG/ML  SOLN COMPARISON:  PET-CT, 08/26/2020, CT chest abdomen pelvis, 08/21/2020 FINDINGS: Cardiovascular: Right chest port catheter. Aortic atherosclerosis. Aortic valve calcifications. Normal heart size. Left and right coronary artery calcifications. Trace pericardial effusion. Mediastinum/Nodes: Interval decrease in size of enlarged subcarinal node, measuring 3.1 x 0.8 cm, previously 3.0 x 1.8 cm when measured similarly (series 2, image 72). Interval resolution of a previously enlarged pretracheal lymph node, measuring no greater than 0.8 x 0.6 cm, previously 2.0 x 1.6 cm (series 2, image 47). Thyroid gland, trachea, and esophagus demonstrate no significant findings. Lungs/Pleura: No significant change in a small subpleural pulmonary nodule of the peripheral posterior  right upper lobe measuring 0.7 cm (series 3, image 48). There is a new nodule of the very inferior posterior right upper lobe abutting the major and  minor fissures measuring 0.7 cm (series 3, image 76). There are numerous new nodules in the left upper lobe and lingula, for example a 7 mm nodule of the posterior lingula (series 3, image 78) and a 9 mm nodule of the dependent left lower lobe (series 3, image 93). Mild fibrosis of the bilateral lung bases. No pleural effusion or pneumothorax. Upper Abdomen: No acute abnormality. Redemonstrated benign, non FDG avid right adrenal adenoma. Musculoskeletal: No chest wall mass or suspicious bone lesions identified. IMPRESSION: 1. Significant interval decrease in size of enlarged, FDG avid mediastinal lymph nodes, consistent with treatment response. 2. A previously seen small subpleural nodule of the peripheral posterior right upper lobe is unchanged and remains nonspecific. 3. There are numerous new small pulmonary nodules, the majority present in the dependent left lower lobe. These are nonspecific although distribution generally suggests an infectious or inflammatory etiology. Metastatic disease is however a substantial differential consideration. 4. Coronary artery disease. Aortic Atherosclerosis (ICD10-I70.0). Electronically Signed   By: Eddie Candle M.D.   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 Multiplanar CT image reconstructions were also generated. COMPARISON:  None. FINDINGS: Alignment: Alignment is grossly anatomic. Skull base and vertebrae: Evaluation of the cervical vertebral bodies is severely limited due to patient motion, especially from the skull base through the C2 level. I do not see any acute or  destructive bony lesions. Soft tissues and spinal canal: No prevertebral fluid or swelling. No visible canal hematoma. Disc levels: Prominent facet hypertrophic changes at C2-3 and C3-4, left predominant. Mild lower cervical disc space narrowing. Upper chest: Central airway is patent.  Lung apices are clear. Other: Reconstructed images demonstrate no additional findings. IMPRESSION: 1. Limited evaluation of the upper cervical spine and skull base due to extensive patient motion. 2. No acute displaced fractures. 3. Mild cervical degenerative changes as above. Electronically Signed   By: Randa Ngo M.D.   On: 12/28/2020 21:42   CT T-SPINE NO CHARGE  Result Date: 01-08-2021 CLINICAL DATA:  Initial evaluation for acute trauma, fall. EXAM: CT THORACIC AND LUMBAR SPINE WITHOUT CONTRAST TECHNIQUE: Multidetector CT imaging of the thoracic and lumbar spine was performed without contrast. Multiplanar CT image reconstructions were also generated. COMPARISON:  Comparison made with concomitant CT of the chest, abdomen, and pelvis. FINDINGS: CT THORACIC SPINE FINDINGS Alignment: Examination mildly degraded by motion artifact. Exaggeration of the normal thoracic kyphosis with underlying trace scoliotic curvature. No significant listhesis. Vertebrae: Vertebral body height maintained without acute or chronic fracture. Visualized ribs intact. No discrete osseous lesions. Paraspinal and other soft tissues: Paraspinous soft tissues demonstrate no acute finding. Scattered pulmonary nodules noted within the partially visualized lungs, better evaluated on concomitant chest CT. Aortic atherosclerosis. Scattered coronary artery calcifications. Hiatal hernia. Disc levels: No visible significant disc bulge or focal disc herniation. No significant spinal stenosis. Mild degenerative endplate spurring noted within the mid and lower thoracic spine. CT LUMBAR SPINE FINDINGS Segmentation: Is standard. Lowest well-formed disc space labeled  the L5-S1 level. Alignment: Physiologic with preservation of the normal lumbar lordosis. No listhesis. Vertebrae: Vertebral body height maintained without acute or chronic fracture. Visualized sacrum and pelvis intact. SI joints symmetric and normal. No worrisome osseous lesions. Paraspinal and other soft tissues: Paraspinous soft tissues demonstrate no acute finding. Advanced aorto bi-iliac atherosclerotic  disease. Iliac stents in place. Right adrenal adenoma noted. Few scattered benign appearing cyst noted about the visualized kidneys. Disc levels: L1-2: Degenerative intervertebral disc space narrowing with mild disc bulge. Mild reactive endplate spurring. No spinal stenosis. Foramina remain patent. L2-3: Disc bulge with mild reactive endplate spurring. Disc bulge slightly eccentric to the right. Moderate right with mild left facet hypertrophy. No significant spinal stenosis. Mild right worse than left L2 foraminal stenosis. L3-4: Mild disc bulge with minimal reactive endplate spurring. Disc bulge slightly asymmetric to the right. Mild facet hypertrophy. No significant spinal stenosis. Foramina remain patent. L4-5: Minimal disc bulge. Mild to moderate bilateral facet hypertrophy. No significant spinal stenosis. Mild left L4 foraminal narrowing. Right neural foramina remains patent. L5-S1: Degenerative intervertebral disc space narrowing with disc desiccation and diffuse disc bulge. Reactive endplate spurring. Mild facet hypertrophy. No significant spinal stenosis. Moderate bilateral L5 foraminal narrowing. IMPRESSION: 1. No acute traumatic injury within the thoracic or lumbar spine. 2. Mild multilevel degenerative spondylosis without significant spinal stenosis moderate bilateral L5 foraminal stenosis. 3. Aortic Atherosclerosis (ICD10-I70.0). Electronically Signed   By: Jeannine Boga M.D.   On: 01/27/2021 01:39   CT L-SPINE NO CHARGE  Result Date: 27-Jan-2021 CLINICAL DATA:  Initial evaluation for acute  trauma, fall. EXAM: CT THORACIC AND LUMBAR SPINE WITHOUT CONTRAST TECHNIQUE: Multidetector CT imaging of the thoracic and lumbar spine was performed without contrast. Multiplanar CT image reconstructions were also generated. COMPARISON:  Comparison made with concomitant CT of the chest, abdomen, and pelvis. FINDINGS: CT THORACIC SPINE FINDINGS Alignment: Examination mildly degraded by motion artifact. Exaggeration of the normal thoracic kyphosis with underlying trace scoliotic curvature. No significant listhesis. Vertebrae: Vertebral body height maintained without acute or chronic fracture. Visualized ribs intact. No discrete osseous lesions. Paraspinal and other soft tissues: Paraspinous soft tissues demonstrate no acute finding. Scattered pulmonary nodules noted within the partially visualized lungs, better evaluated on concomitant chest CT. Aortic atherosclerosis. Scattered coronary artery calcifications. Hiatal hernia. Disc levels: No visible significant disc bulge or focal disc herniation. No significant spinal stenosis. Mild degenerative endplate spurring noted within the mid and lower thoracic spine. CT LUMBAR SPINE FINDINGS Segmentation: Is standard. Lowest well-formed disc space labeled the L5-S1 level. Alignment: Physiologic with preservation of the normal lumbar lordosis. No listhesis. Vertebrae: Vertebral body height maintained without acute or chronic fracture. Visualized sacrum and pelvis intact. SI joints symmetric and normal. No worrisome osseous lesions. Paraspinal and other soft tissues: Paraspinous soft tissues demonstrate no acute finding. Advanced aorto bi-iliac atherosclerotic disease. Iliac stents in place. Right adrenal adenoma noted. Few scattered benign appearing cyst noted about the visualized kidneys. Disc levels: L1-2: Degenerative intervertebral disc space narrowing with mild disc bulge. Mild reactive endplate spurring. No spinal stenosis. Foramina remain patent. L2-3: Disc bulge with  mild reactive endplate spurring. Disc bulge slightly eccentric to the right. Moderate right with mild left facet hypertrophy. No significant spinal stenosis. Mild right worse than left L2 foraminal stenosis. L3-4: Mild disc bulge with minimal reactive endplate spurring. Disc bulge slightly asymmetric to the right. Mild facet hypertrophy. No significant spinal stenosis. Foramina remain patent. L4-5: Minimal disc bulge. Mild to moderate bilateral facet hypertrophy. No significant spinal stenosis. Mild left L4 foraminal narrowing. Right neural foramina remains patent. L5-S1: Degenerative intervertebral disc space narrowing with disc desiccation and diffuse disc bulge. Reactive endplate spurring. Mild facet hypertrophy. No significant spinal stenosis. Moderate bilateral L5 foraminal narrowing. IMPRESSION: 1. No acute traumatic injury within the thoracic or lumbar spine. 2. Mild multilevel degenerative  spondylosis without significant spinal stenosis moderate bilateral L5 foraminal stenosis. 3. Aortic Atherosclerosis (ICD10-I70.0). Electronically Signed   By: Jeannine Boga M.D.   On: January 23, 2021 01:39   DG Chest Portable 1 View  Result Date: 12/18/2020 CLINICAL DATA:  75 year old male with altered mental status. EXAM: PORTABLE CHEST 1 VIEW COMPARISON:  Chest CT dated 12/11/2020. FINDINGS: Right-sided Port-A-Cath with tip at the cavoatrial junction. There is shallow inspiration. Diffuse interstitial prominence and bibasilar reticular scarring. No focal consolidation, pleural effusion, or pneumothorax. Mild cardiomegaly. No acute osseous pathology. IMPRESSION: 1. No acute cardiopulmonary process. 2. Mild cardiomegaly. Electronically Signed   By: Anner Crete M.D.   On: 12/06/2020 21:15   CT CHEST ABDOMEN PELVIS WO CONTRAST  Result Date: 01/23/2021 CLINICAL DATA:  Stage IV lung cancer with intracranial metastases, fell, altered level of consciousness EXAM: CT CHEST, ABDOMEN AND PELVIS WITHOUT CONTRAST  TECHNIQUE: Multidetector CT imaging of the chest, abdomen and pelvis was performed following the standard protocol without IV contrast. Unenhanced CT was performed per clinician order. Lack of IV contrast limits sensitivity and specificity, especially for evaluation of vascular structures and abdominal/pelvic solid viscera. COMPARISON:  12/11/2020 FINDINGS: CT CHEST FINDINGS Cardiovascular: Unenhanced imaging of the heart and great vessels demonstrates no pericardial effusion. Normal caliber of the thoracic aorta. Extensive atherosclerosis of the aorta and coronary vessels. Mediastinum/Nodes: 8 mm short axis subcarinal lymph node is unchanged. No new lymphadenopathy. Thyroid, trachea, and esophagus are unremarkable. Stable small hiatal hernia. Lungs/Pleura: No acute airspace disease, effusion, or pneumothorax. Stable 7 mm subpleural right upper lobe pulmonary nodule, reference image 48/4. The right upper lobe subpleural nodule at the junction of the major and minor fissures on prior study is not identified on this exam. 7 mm lingular nodule image 79/4 and and 8 mm left lower lobe pulmonary nodule image 94/4 are unchanged. Stable subpleural scarring and fibrosis at the lung bases. Central airways are patent. Musculoskeletal: No acute or destructive bony lesions. Reconstructed images demonstrate no additional findings. CT ABDOMEN PELVIS FINDINGS Hepatobiliary: Gallbladder is surgically absent. Unenhanced imaging of the liver demonstrates no gross abnormalities. Pancreas: Unremarkable. No pancreatic ductal dilatation or surrounding inflammatory changes. Spleen: Normal in size without focal abnormality. Adrenals/Urinary Tract: Stable 2.7 cm right adrenal nodule previously shown to have imaging characteristics compatible with adenoma. The left adrenal is grossly normal. Bilateral renal cortical cysts are identified. No urinary tract calculi or obstructive uropathy. Asymmetric wall thickening along the left posterolateral  aspect of the bladder again seen, measuring 6 mm. Stomach/Bowel: There is chronic wall thickening of the mid sigmoid colon, consistent with scarring from prior inflammation. Diffuse diverticulosis of the distal colon without evidence of acute diverticulitis. No bowel obstruction or ileus. Vascular/Lymphatic: Extensive atherosclerosis of the aorta. Bilateral common iliac artery stents are noted. No pathologic adenopathy. Reproductive: Prostate is enlarged but stable. Other: No free fluid or free gas.  No abdominal wall hernia. Musculoskeletal: No acute or destructive bony lesions. Reconstructed images demonstrate no additional findings. IMPRESSION: 1. Stable bilateral subcentimeter pulmonary nodules. These remain indeterminate, and close attention on follow-up is recommended to exclude metastatic disease. 2. Stable subcentimeter subcarinal lymph node. No pathologic adenopathy. 3. Chronic wall thickening of the left posterolateral aspect of the bladder, unchanged since 2017. If not previously performed, cystoscopy may be useful. 4. Chronic wall thickening of the mid sigmoid colon, consistent with scarring after previous bouts of inflammation. No evidence of acute inflammation or diverticulitis on today's exam. 5. Stable right adrenal adenoma. 6.  Aortic Atherosclerosis (ICD10-I70.0). Electronically  Signed   By: Randa Ngo M.D.   On: 2021/01/10 00:13    Metastatic lung cancer (metastasis from lung to other site) [facility]) #75 year old Caucasian male patient with history of stage IV lung cancer currently in the hospital for  #Metastatic lung cancer to the brain s/p whole brain radiation; currently on Keytruda chemotherapy.  Discontinue chemotherapy/see discussion below.  #Altered mental status/fever/respiratory failure-sepsis-unclear source  #DNR/DNI:  #Recommendations:  #Given patient's rapid decompensation-in the context of his incurable malignancy agree with de-escalation of care/comfort measures.   Discussed with patient's wife/son and family difficulty of aggressive care in the context of his incurable malignancy with unfortunately prolonged suffering.  Continue comfort measures.  Discussed regarding shifting to hospice home however patient condition seems to be tenuous.  Awaiting bed availability in hospital.  Thank you Dr.Krishnan for allowing me to participate in the care of your pleasant patient. Please do not hesitate to contact me with questions or concerns in the interim.  Discussed with Dr. Regenia Skeeter; and Dr.Krishnan.  Addendum: At the time of signing the note-patient had passed away.   All questions were answered. The patient knows to call the clinic with any problems, questions or concerns.   Cammie Sickle, MD 10-Jan-2021 8:59 PM

## 2020-12-31 NOTE — Assessment & Plan Note (Addendum)
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  Discontinue chemotherapy/see discussion below.  #Altered mental status/fever/respiratory failure-sepsis-unclear source  #DNR/DNI:  #Recommendations:  #Given patient's rapid decompensation-in the context of his incurable malignancy agree with de-escalation of care/comfort measures.  Discussed with patient's wife/son and family difficulty of aggressive care in the context of his incurable malignancy with unfortunately prolonged suffering.  Continue comfort measures.  Discussed regarding shifting to hospice home however patient condition seems to be tenuous.  Awaiting bed availability in hospital.  Thank you Dr.Krishnan for allowing me to participate in the care of your pleasant patient. Please do not hesitate to contact me with questions or concerns in the interim.  Discussed with Dr. Regenia Skeeter; and Dr.Krishnan.  Addendum: At the time of signing the note-patient had passed away.

## 2020-12-31 NOTE — ED Notes (Signed)
Patient restless and fidgeting with monitor pulling off cords. Per wife, patient has appeared restless and agitated for several hours. PRN medication available. See MAR.

## 2020-12-31 NOTE — ED Provider Notes (Signed)
-----------------------------------------   12:37 AM on 19-Jan-2021 -----------------------------------------  I accepted care from Dr. Charna Archer at signout.  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  He is cared for by Dr. Rogue Bussing.  We were awaiting CT scan results to make sure there is no additional evidence of traumatic injury, and fortunately the CT results are reassuring other than the known vasogenic edema in his head for which he received Decadron 10 mg IV after consultation by phone with Dr. Lacinda Axon with neurosurgery.  I discussed the results with the patient's wife.  The patient is still very encephalopathic/delirious and unable to participate in history which is abnormal for him.  According to the wife, who is the patient's power of attorney, no one has had any discussion with them regarding end-of-life planning, DNR/DNI, etc.  I talked with her about it and explained the concept and encouraged her to think about whether or not the patient would want to be DNR under the circumstances or his condition to worsen during this hospitalization.  I also explained that she would likely have the opportunity to talk again with the hospitalist about this and that they may order a palliative care consult.  I subsequently spoke by phone with Dr. Damita Dunnings of the hospitalist service and she will admit.   Hinda Kehr, MD 2021/01/19 [phone]

## 2020-12-31 NOTE — ED Notes (Signed)
Admitting MD made aware patient having runs of Vtach, periods of apnea, O2 sats 60-89% on 15L NRB and patient increasingly diaphoretic. Dr. [place] Pink spoke with patient's wife, comfort care orders placed at this time.

## 2020-12-31 NOTE — ED Notes (Signed)
Pharmacy called for dose of cefepime not currently in ED pyxis. Will tube shortly.

## 2020-12-31 NOTE — ED Notes (Signed)
This RN administered ordered haldol and patient became more lethargic and desaturated to 50%. MD isaacs was able to help RN  reposition patient in bed and improve oxygenation with jaw thrust. Pt now at 95%.

## 2020-12-31 NOTE — ED Notes (Signed)
ED Provider at bedside. 

## 2020-12-31 NOTE — Progress Notes (Addendum)
Patient admitted earlier this morning.  H&P reviewed.  Patient seen and examined.  Wife is at the bedside.  S: Patient noted to be quite agitated.  Does not answer any questions.  Wife is at the bedside.  He apparently started becoming altered about 24 hours prior to admission.  He also had a fall at home.  After he was evaluated in the ED and seen by admitting provider patient developed a fever.  O: T-max noted to be 102.6.  Noted to be hypoxic on room air.  Oxygen was applied at 2 L initially.  Escalated to a nonrebreather.  Remains quite agitated.  Lungs show diminished air entry at the bases without any definite crackles or wheezing S1-S2 is tachycardic regular Abdomen is soft. No skin rashes appreciated. Neck is soft and supple Moving all of his extremities.  Thrombocytopenia noted on CBC.  Normocytic anemia also noted.  Procalcitonin less than 0.1.  Lactic acid level 1.4.  Cultures have been sent.  UA was unremarkable.  CT scan of the chest abdomen pelvis reviewed.  Pulmonary nodules were noted.  No other pulmonary findings appreciated.  Other chronic changes were seen.  A/P: Reason for his acute encephalopathy is not entirely clear.  CT head showed stable findings of brain mets with vasogenic edema.  Discussed with neurosurgery who recommended steroids and Keppra.  Patient used to be on Keppra but was recently taken off of it.  EEG.  Use Ativan and Haldol to control agitation.  Patient also developed fever which is concerning for infection.  He is leukopenic.  No obvious source of infection is found.  LP was considered however patient is quite agitated and has thrombocytopenia which makes doing an LP very challenging with high risk for complications.  Discussed with patient's wife.  Will cancel LP for now and continue empiric antibiotics.  Patient also noted to be quite hypoxic.  Reason for hypoxia is likely alteration mental status.  He is a mouth breather.  No obvious parenchymal  abnormality noted on CT chest except for the known pulmonary nodules.  Continue to monitor.  History of lung cancer with metastases to brain is being followed by his medical oncologist.  He has been getting palliative chemo and radiation.  I did discuss with patient's wife after he was placed on a nonrebreather.  She reviewed his living will and mentions that he would not want any aggressive care if he were to worsen.  In view of this we will change him to DNR.  We will continue current treatment and the wife understands that if he continues to decline then transition to comfort care may be suitable.  She will inform rest of the family members. Prognosis is poor at this time. Palliative care will be consulted.  Will continue to follow closely.  ADDENDUM Called back by the nursing that the patient was now experiencing runs of V tach and saturations are in the 80s.  Discussed with wife.  She is agreeable to transition to comfort care.  He will likely pass in the next few hours.  Alejandro Randolph 2021/01/22

## 2020-12-31 NOTE — H&P (Signed)
History and Physical    Alejandro Randolph WUX:[id number] DOB: 1946-02-03 DOA: 12/17/2020  PCP: Maryland Pink, MD   Patient coming from: Home  I have personally briefly reviewed patient's old medical records in [place]  Chief Complaint: Fall, altered mental status  HPI: Alejandro Randolph is a 75 y.o. male with medical history significant for DM, HTN, lung cancer with brain metastases on palliative chemotherapy who was brought to the emergency room by EMS for a fall following which he has been confused and altered.  Patient is unable to contribute to history due to confusion so history is taken from ER documentation and wife at bedside ED course: On arrival patient was speaking in garbled phrases but alert.  He was afebrile, BP 149/92, tachycardic at 119 with O2 sat 99% on room air.  Blood work mostly unremarkable except for hemoglobin 10.9, baseline 11-12 and new thrombocytopenia of 98,000 down from 270,000.  Troponin 8-9.  Urinalysis with ketones but otherwise unremarkable.  COVID and flu negative EKG, personally viewed and interpreted: Sinus tachycardia at 106 with artifact Imaging: CT head: Stable areas of vasogenic edema within the left cerebellar hemisphere, right temporal lobe, left occipital lobe compatible with known intracranial metastasis unchanged since MRI 4/21 CT C-spine without acute injury CT chest abdomen pelvis with stable findings  The ED provider spoke with neurosurgeon Dr. Lacinda Axon regarding findings on CT head and opined that confusion may not be related to mets and could be related to other etiologies such as concussion from fall Patient treated with droperidol and dexamethasone.  Hospitalist consulted for admission.  Review of Systems: Unable to obtain due to confusion   Past Medical History:  Diagnosis Date  . Abscess of sigmoid colon   . Cancer [facility])    brain tumor  . Diabetes mellitus without complication ([place])   . GERD (gastroesophageal reflux disease)    . Hypercholesteremia   . Hypertension   . Peripheral vascular disease ([place])   . PUD (peptic ulcer disease)     Past Surgical History:  Procedure Laterality Date  . CHOLECYSTECTOMY    . COLONOSCOPY WITH PROPOFOL N/A 07/19/2016   Procedure: COLONOSCOPY WITH PROPOFOL;  Surgeon: Jonathon Bellows, MD;  Location: [facility] ORS;  Service: Endoscopy;  Laterality: N/A;  . COLONOSCOPY WITH PROPOFOL N/A 09/02/2016   Procedure: COLONOSCOPY WITH PROPOFOL;  Surgeon: Jonathon Bellows, MD;  Location: [facility] ENDOSCOPY;  Service: Endoscopy;  Laterality: N/A;  . COLONOSCOPY WITH PROPOFOL N/A 09/04/2020   Procedure: COLONOSCOPY WITH PROPOFOL;  Surgeon: Jonathon Bellows, MD;  Location: [facility] ENDOSCOPY;  Service: Gastroenterology;  Laterality: N/A;  . femoral stents Bilateral   . FLEXIBLE SIGMOIDOSCOPY  07/19/2016   Procedure: FLEXIBLE SIGMOIDOSCOPY;  Surgeon: Jonathon Bellows, MD;  Location: [facility] ORS;  Service: Endoscopy;;  . HERNIA REPAIR    . IR IMAGING GUIDED PORT INSERTION  10/10/2020  . RETINAL DETACHMENT SURGERY    . TONSILLECTOMY    . VIDEO BRONCHOSCOPY WITH ENDOBRONCHIAL ULTRASOUND N/A 09/01/2020   Procedure: VIDEO BRONCHOSCOPY WITH ENDOBRONCHIAL ULTRASOUND;  Surgeon: Ottie Glazier, MD;  Location: [facility] ORS;  Service: Thoracic;  Laterality: N/A;     reports that he quit smoking about 21 years ago. He has never used smokeless tobacco. He reports that he does not drink alcohol and does not use drugs.  Allergies  Allergen Reactions  . Atorvastatin Rash    Hip pain    Family History  Problem Relation Age of Onset  . Diabetes Mother   . Hypertension Mother   .  Diabetes Father       Prior to Admission medications   Medication Sig Start Date End Date Taking? Authorizing Provider  amLODipine (NORVASC) 10 MG tablet Take 10 mg by mouth daily. 06/23/16   [provider]  aspirin EC 81 MG tablet Take 81 mg by mouth daily.    [provider]  fluticasone (FLONASE) 50 MCG/ACT nasal spray Place 1 spray into both  nostrils daily as needed for allergies. 07/08/20   [provider]  folic acid (FOLVITE) 1 MG tablet Take 1 tablet (1 mg total) by mouth daily. 09/29/20   Cammie Sickle, MD  glipiZIDE (GLUCOTROL) 5 MG tablet TAKE 1 TABLET BY MOUTH ONCE A DAY BEFOREBREAKFAST 12/11/20   Borders, Kirt Boys, NP  glucose blood (ONETOUCH ULTRA) test strip USE AS DIRECTED TWICE A DAY TO CHECK BLOOD SUGAR 03/02/19   [provider]  lidocaine-prilocaine (EMLA) cream Apply 1 application topically as needed. Apply 30-45 mins prior to port access. 09/29/20   Cammie Sickle, MD  metFORMIN (GLUCOPHAGE-XR) 500 MG 24 hr tablet Take 500 mg by mouth 2 (two) times daily. 06/05/16   [provider]  Omega-3 Fatty Acids (OMEGA-3 FISH OIL) 500 MG CAPS Take 500 mg by mouth daily.    [provider]  ondansetron (ZOFRAN) 8 MG tablet One pill every 8 hours as needed for nausea/vomitting. 09/29/20   Cammie Sickle, MD  pantoprazole (PROTONIX) 40 MG tablet Take 40 mg by mouth daily. 05/25/16   [provider]  potassium chloride (KLOR-CON) 10 MEQ tablet Take 1 tablet (10 mEq total) by mouth daily. 12/15/20   Earlie Server, MD  ROCKLATAN 0.02-0.005 % SOLN Place 1 drop into the left eye daily. 08/17/19   [provider]  rosuvastatin (CRESTOR) 5 MG tablet Take 5 mg by mouth at bedtime. 05/10/16   [provider]  Saw Palmetto, Serenoa repens, (SAW PALMETTO PO) Take 450 mg by mouth daily.    [provider]    Physical Exam: Vitals:   11/30/2020 2132 12/12/2020 2145 12/12/2020 2307 01/14/2021 0051  BP:  (!) 149/92 (!) 157/97 (!) 147/86  Pulse:  (!) 119 (!) 110 100  Resp:  20 18 20   Temp: 98.7 F (37.1 C)     TempSrc: Axillary     SpO2:  99% 95% 97%  Weight:      Height:         Vitals:   12/13/2020 2132 11/30/2020 2145 12/17/2020 2307 01-14-2021 0051  BP:  (!) 149/92 (!) 157/97 (!) 147/86  Pulse:  (!) 119 (!) 110 100  Resp:  20 18 20   Temp: 98.7 F (37.1 C)      TempSrc: Axillary     SpO2:  99% 95% 97%  Weight:      Height:          Constitutional:  Sedated from recent Ativan given for agitation but appears restless. HEENT:      Head: Normocephalic and atraumatic.         Eyes: PERLA, EOMI, Conjunctivae are normal. Sclera is non-icteric.       Mouth/Throat: Mucous membranes are moist.       Neck: Supple with no signs of meningismus. Cardiovascular: Regular rate and rhythm. No murmurs, gallops, or rubs. 2+ symmetrical distal pulses are present . No JVD. No LE edema Respiratory: Respiratory effort normal .Lungs sounds clear bilaterally. No wheezes, crackles, or rhonchi.  Gastrointestinal: Soft, non tender, and non distended with positive bowel sounds.  Genitourinary: No CVA tenderness. Musculoskeletal: Nontender with normal range of motion in all extremities. No cyanosis, or erythema of extremities. Neurologic:  Face is symmetric. Moving all extremities. No gross focal neurologic deficits . Skin: Skin is warm, dry.  No rash or ulcers Psychiatric: Unable to assess due to sedation from recent Ativan   Labs on Admission: I have personally reviewed following labs and imaging studies  CBC: Recent Labs  Lab 12/07/2020 2057  WBC 4.4  NEUTROABS 2.2  HGB 10.9*  HCT 31.5*  MCV 96.9  PLT 98*   Basic Metabolic Panel: Recent Labs  Lab 12/01/2020 2057  NA 138  K 3.7  CL 103  CO2 22  GLUCOSE 153*  BUN 13  CREATININE 1.07  CALCIUM 10.3   GFR: Estimated Creatinine Clearance: 69.7 mL/min (by C-G formula based on SCr of 1.07 mg/dL). Liver Function Tests: Recent Labs  Lab 12/17/2020 2057  AST 24  ALT 21  ALKPHOS 49  BILITOT 0.8  PROT 7.2  ALBUMIN 4.1   No results for input(s): LIPASE, AMYLASE in the last 168 hours. No results for input(s): AMMONIA in the last 168 hours. Coagulation Profile: No results for input(s): INR, PROTIME in the last 168 hours. Cardiac Enzymes: No results for input(s): CKTOTAL, CKMB, CKMBINDEX, TROPONINI in  the last 168 hours. BNP (last 3 results) No results for input(s): PROBNP in the last 8760 hours. HbA1C: No results for input(s): HGBA1C in the last 72 hours. CBG: No results for input(s): GLUCAP in the last 168 hours. Lipid Profile: No results for input(s): CHOL, HDL, LDLCALC, TRIG, CHOLHDL, LDLDIRECT in the last 72 hours. Thyroid Function Tests: No results for input(s): TSH, T4TOTAL, FREET4, T3FREE, THYROIDAB in the last 72 hours. Anemia Panel: No results for input(s): VITAMINB12, FOLATE, FERRITIN, TIBC, IRON, RETICCTPCT in the last 72 hours. 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 COMPARISON:  11/20/2020 FINDINGS: Brain: Patient motion throughout the exam limits evaluation. Hypodensities within the left cerebellar hemisphere, right temporal lobe, left occipital lobe correspond areas of vasogenic edema seen on recent MRI, compatible with known intracranial metastases. No signs of acute infarct or hemorrhage. The lateral ventricles and midline structures are unremarkable. No acute extra-axial fluid collections. Vascular: No hyperdense vessel or unexpected calcification. Skull: Normal. Negative for fracture or focal lesion. Sinuses/Orbits: Chronic bilateral mastoid effusions. The  paranasal sinuses are unremarkable. Other: None. IMPRESSION: 1. Limited study due to patient motion. 2. No acute infarct or hemorrhage. 3. Stable areas of vasogenic edema within the left cerebellar hemisphere, right temporal lobe, and left occipital lobe, compatible with known intracranial metastases. No significant change since recent MRI. Electronically Signed   By: Randa Ngo M.D.   On: 12/14/2020 21:40   CT Cervical Spine Wo Contrast  Result Date: 12/09/2020 CLINICAL DATA:  Stage IV lung  cancer with intracranial metastases, fell EXAM: CT CERVICAL SPINE WITHOUT CONTRAST TECHNIQUE: Multidetector CT imaging of the cervical spine was performed without intravenous contrast. Multiplanar CT image reconstructions were also generated. COMPARISON:  None. FINDINGS: Alignment: Alignment is grossly anatomic. Skull base and vertebrae: Evaluation of the cervical vertebral bodies is severely limited due to patient motion, especially from the skull base through the C2 level. I do not see any acute or destructive bony lesions. Soft tissues and spinal canal: No prevertebral fluid or swelling. No visible canal hematoma. Disc levels: Prominent facet hypertrophic changes at C2-3 and C3-4, left predominant. Mild lower cervical disc space narrowing. Upper chest: Central airway is patent.  Lung apices are clear. Other: Reconstructed images demonstrate no additional findings. IMPRESSION: 1. Limited evaluation of the upper cervical spine and skull base due to extensive patient motion. 2. No acute displaced fractures. 3. Mild cervical degenerative changes as above. Electronically Signed   By: Randa Ngo M.D.   On: 12/20/2020 21:42   DG Chest Portable 1 View  Result Date: 12/26/2020 CLINICAL DATA:  75 year old male with altered mental status. EXAM: PORTABLE CHEST 1 VIEW COMPARISON:  Chest CT dated 12/11/2020. FINDINGS: Right-sided Port-A-Cath with tip at the cavoatrial junction. There is shallow inspiration. Diffuse  interstitial prominence and bibasilar reticular scarring. No focal consolidation, pleural effusion, or pneumothorax. Mild cardiomegaly. No acute osseous pathology. IMPRESSION: 1. No acute cardiopulmonary process. 2. Mild cardiomegaly. Electronically Signed   By: Anner Crete M.D.   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 Unenhanced CT was performed per clinician order. Lack of IV contrast limits sensitivity and specificity, especially for evaluation of vascular structures and abdominal/pelvic solid viscera. COMPARISON:  12/11/2020 FINDINGS: CT CHEST FINDINGS Cardiovascular: Unenhanced imaging of the heart and great vessels demonstrates no pericardial effusion. Normal caliber of the thoracic aorta. Extensive atherosclerosis of the aorta and coronary vessels. Mediastinum/Nodes: 8 mm short axis subcarinal lymph node is unchanged. No new lymphadenopathy. Thyroid, trachea, and esophagus are unremarkable. Stable small hiatal hernia. Lungs/Pleura: No acute airspace disease, effusion, or pneumothorax. Stable 7 mm subpleural right upper lobe pulmonary nodule, reference image 48/4. The right upper lobe subpleural nodule at the junction of the major and minor fissures on prior study is not identified on this exam. 7 mm lingular nodule image 79/4 and and 8 mm left lower lobe pulmonary nodule image 94/4 are unchanged. Stable subpleural scarring and fibrosis at the lung bases. Central airways are patent. Musculoskeletal: No acute or destructive bony lesions. Reconstructed images demonstrate no additional findings. CT ABDOMEN PELVIS FINDINGS Hepatobiliary: Gallbladder is surgically absent.  Unenhanced imaging of the liver demonstrates no gross abnormalities. Pancreas: Unremarkable. No pancreatic ductal dilatation or surrounding inflammatory changes. Spleen: Normal in size without focal abnormality. Adrenals/Urinary Tract: Stable 2.7 cm right adrenal nodule previously shown to have imaging characteristics compatible with adenoma. The left adrenal is grossly normal. Bilateral renal cortical cysts are identified. No urinary tract calculi or obstructive uropathy. Asymmetric wall thickening along the left posterolateral aspect of the bladder again seen, measuring 6 mm. Stomach/Bowel: There is chronic wall thickening of the mid sigmoid colon, consistent with scarring from prior inflammation. Diffuse diverticulosis of the distal colon without evidence of acute diverticulitis. No bowel  obstruction or ileus. Vascular/Lymphatic: Extensive atherosclerosis of the aorta. Bilateral common iliac artery stents are noted. No pathologic adenopathy. Reproductive: Prostate is enlarged but stable. Other: No free fluid or free gas.  No abdominal wall hernia. Musculoskeletal: No acute or destructive bony lesions. Reconstructed images demonstrate no additional findings. IMPRESSION: 1. Stable bilateral subcentimeter pulmonary nodules. These remain indeterminate, and close attention on follow-up is recommended to exclude metastatic disease. 2. Stable subcentimeter subcarinal lymph node. No pathologic adenopathy. 3. Chronic wall thickening of the left posterolateral aspect of the bladder, unchanged since 2017. If not previously performed, cystoscopy may be useful. 4. Chronic wall thickening of the mid sigmoid colon, consistent with scarring after previous bouts of inflammation. No evidence of acute inflammation or diverticulitis on today's exam. 5. Stable right adrenal adenoma. 6.  Aortic Atherosclerosis (ICD10-I70.0). Electronically Signed   By: Randa Ngo M.D.   On: Jan 19, 2021 00:13     Assessment/Plan 75 year old  male with history of DM, HTN, lung cancer with brain metastases on palliative chemotherapy who was brought to the emergency room by EMS for a fall following which he has been confused and altered.  Patient is unable to contribute to history due to confusion so history is taken from ER documentation ED course: On arrival patient was speaking in garbled phrases but alert.  He was afebrile, BP 149/92, tachycardic at 119 with O2 sat 99% on room air.  Blood work mostly unremarkable except for hemoglobin 10.9, baseline 11-12 and new thrombocytopenia of 98,000 down from 270,000.  Troponin 8-9.  Urinalysis with ketones but otherwise unremarkable.  COVID and flu negative EKG, personally viewed and interpreted: A. fib at 106 Imaging:  CT C-spine without acute injury CT chest abdomen pelvis with stable findings  The ED provider spoke with neurosurgeon Dr. Lacinda Axon regarding findings on CT head and opined that confusion may not be related to mets and could be related to other etiologies such as concussion from fall Patient treated with droperidol and dexamethasone.  Hospitalist consulted for admission.  Acute metabolic encephalopathy Fall at home - Patient presenting with acute confusion, inability to understand and at times speak clearly -CT head: Stable areas of vasogenic edema ...unchanged since MRI 4/21 - Etiology suspect related to brain mets as no other etiology found on work-up - Neurosurgery, Dr. Lacinda Axon was consulted from the ER, unlikely related to meds given stable findings but recommended dexamethasone - Continue dexamethasone 4 mg IV twice daily as recommended by Dr. Lacinda Axon - Will restart Keppra , which was recently discontinued by oncology due to improved MRI findings - Neurologic checks with fall and aspiration precautions - Consider oncology, neurosurgery consult in the a.m. Addendum: - Following admission, patient developed a temperature of 101.9 and sepsis labs ordered - Started empirically on  antibiotics for sepsis of unknown source and sepsis fluids - IR consult for image guided LP ordered    Primary malignant neoplasm of lung with metastasis to brain - Patient on palliative chemotherapy cycle 4, last session on 5/12    Thrombocytopenia (HCC) - Platelet count 98,000, down from 240,000 a couple weeks prior, likely related to chemotherapy received on 5/12 - Continue to monitor - SCD for DVT prophylaxis for now    Hypertension - Continue home meds    Diabetes ([place]) - Sliding scale insulin coverage   DVT prophylaxis: Lovenox  Code Status: full code  Family Communication: Wife at bedside Disposition Plan: Back to previous home environment Consults called: none  Status:At the time of admission, it appears  that the appropriate admission status for this patient is INPATIENT. This is judged to be reasonable and necessary in order to provide the required intensity of service to ensure the patient's safety given the presenting symptoms, physical exam findings, and initial radiographic and laboratory data in the context of their  Comorbid conditions.   Patient requires inpatient status due to high intensity of service, high risk for further deterioration and high frequency of surveillance required.   I certify that at the point of admission it is my clinical judgment that the patient will require inpatient hospital care spanning beyond [place] MD [facility]     2021-01-14, 12:59 AM

## 2020-12-31 NOTE — ED Notes (Signed)
Hospitliast at bedside and updated on rectal temp. Patient appears to be sleeping comfortably at this time.

## 2020-12-31 NOTE — ED Notes (Addendum)
Patient continuing to pull off equipment. Second RN at bedside to assist this RN. Pt removed external urinary cathter and soiled linen. Peri care provided. Patient linen changed and pt changed into gown. Brief placed over new external catheter. Mitts applied to help patient from removing equipment. Patient still disoriented x4. NPO due to mental status. Oral glycerin swabs used for dry mouth. Wife resting comfortably at bedside. No further needs identified at this time.

## 2020-12-31 NOTE — Progress Notes (Signed)
Discussed case with medical examiner Wilburn Mylar. He denied case as [place] jurisdiction

## 2020-12-31 NOTE — Death Summary Note (Addendum)
DEATH SUMMARY   Patient Details  Name: Alejandro Randolph MRN: [id number] DOB: 03-05-1946  Admission/Discharge Information   Admit Date:  2021/01/27  Date of Death: Date of Death: 01-28-2021  Time of Death: Time of Death: 0607   Length of Stay: 1  Referring Physician: Maryland Pink, MD   Reason(s) for Hospitalization  Acute Encephalopathy  Diagnoses  Preliminary cause of death: Metastatic lung cancer (metastasis from lung to other site) [facility]) Secondary Diagnoses (including complications and co-morbidities):  Principal Problem:   Acute metabolic encephalopathy Active Problems:   Hypertension   Diabetes ([place])   Primary malignant neoplasm of lung with metastasis to brain [facility])   Fall at home, initial encounter   Thrombocytopenia ([place])   AMS (altered mental status)   Metastatic lung cancer (metastasis from lung to other site) [facility])   [place] [facility] Course (including significant findings, care, treatment, and services provided and events leading to death)   HPI: Alejandro Randolph is a 75 y.o. male with medical history significant for DM, HTN, lung cancer with brain metastases on palliative chemotherapy who was brought to the emergency room by EMS for a fall following which he has been confused and altered.  Patient is unable to contribute to history due to confusion so history is taken from ER documentation and wife at bedside ED course: On arrival patient was speaking in garbled phrases but alert.  He was afebrile, BP 149/92, tachycardic at 119 with O2 sat 99% on room air.  Blood work mostly unremarkable except for hemoglobin 10.9, baseline 11-12 and new thrombocytopenia of 98,000 down from 270,000.  Troponin 8-9.  Urinalysis with ketones but otherwise unremarkable.  COVID and flu negative EKG, personally viewed and interpreted: Sinus tachycardia at 106 with artifact Imaging: CT head: Stable areas of vasogenic edema within the left cerebellar hemisphere, right temporal lobe, left occipital  lobe compatible with known intracranial metastasis unchanged since MRI 4/21 CT C-spine without acute injury CT chest abdomen pelvis with stable findings  The ED provider spoke with neurosurgeon Dr. Lacinda Axon regarding findings on CT head and opined that confusion may not be related to mets and could be related to other etiologies such as concussion from fall Patient treated with droperidol and dexamethasone.  Hospitalist consulted for admission.   HOSPITAL COURSE  Patient was initially hospitalized with acute encephalopathy.  Subsequently developed fever and then followed by hypoxia as discussed below.  Based on patient's known wishes patient's wife agreed with transition to DNR.  With the continued deterioration which occurred quite rapidly during this hospital stay was transition to comfort care and he expired on Jan 28, 2021 at 6:07 PM.  See below for other details.  Acute metabolic encephalopathy Fall at home Patient presenting with acute confusion, inability to understand and at times speak clearly.  No new findings were noted on the CT head.  It showed stable areas of vasogenic edema unchanged from recent MRI.  Patient was started on dexamethasone.  Keppra was resumed.  Patient's mentation however continued to get worse.  Acute respiratory failure with hypoxia Patient's respiratory status also started worsening.  He became more more hypoxic.  Noncontrast CT chest only showed pulmonary nodules without any new changes.  Quite possible that the patient may have aspirated.  PE is another possibility.  However patient's status became quite tenuous.  After discussions with wife and based on his known wishes he was changed to DNR.  Subsequently was transitioned to comfort care.  Fever Patient developed fever.  Reason for this  was unclear.  LP was considered however patient was quite agitated and with thrombocytopenia it was felt that he would be very high risk to attempted at this time.  He was  empirically started on broad-spectrum antibiotics.  Primary malignant neoplasm of lung with metastasis to brain Had been getting palliative chemotherapy and radiation treatment.  Patient's oncologist was notified of this hospitalization.  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 COMPARISON:  11/20/2020 FINDINGS: Brain: Patient motion throughout the exam limits evaluation. Hypodensities within the left cerebellar hemisphere, right temporal lobe, left occipital lobe correspond areas of vasogenic edema seen on recent MRI, compatible with known intracranial metastases. No signs of acute infarct or hemorrhage. The lateral ventricles and midline structures are unremarkable. No acute extra-axial fluid collections. Vascular: No hyperdense vessel or unexpected calcification. Skull: Normal. Negative for fracture or focal lesion. Sinuses/Orbits: Chronic bilateral mastoid effusions. The paranasal sinuses are unremarkable. Other: None. IMPRESSION: 1. Limited study due to patient motion. 2. No acute infarct or hemorrhage. 3. Stable areas of vasogenic edema within the left cerebellar hemisphere, right temporal lobe, and left occipital lobe, compatible with known intracranial metastases. No significant change since recent MRI. Electronically Signed   By: Randa Ngo M.D.   On: 12/19/2020 21:40   CT Chest W Contrast  Result Date: 12/12/2020 CLINICAL DATA:  Metastatic right lung cancer, assess treatment response EXAM: CT CHEST WITH  CONTRAST TECHNIQUE: Multidetector CT imaging of the chest was performed during intravenous contrast administration. CONTRAST:  82mL OMNIPAQUE IOHEXOL 300 MG/ML  SOLN COMPARISON:  PET-CT, 08/26/2020, CT chest abdomen pelvis, 08/21/2020 FINDINGS: Cardiovascular: Right chest port catheter. Aortic atherosclerosis. Aortic valve calcifications. Normal heart size. Left and right coronary artery calcifications. Trace pericardial effusion. Mediastinum/Nodes: Interval decrease in size of enlarged subcarinal node, measuring 3.1 x 0.8 cm, previously 3.0 x 1.8 cm when measured similarly (series 2, image 72). Interval resolution of a previously enlarged pretracheal lymph node, measuring no greater than 0.8 x 0.6 cm, previously 2.0 x 1.6 cm (series 2, image 47). Thyroid gland, trachea, and esophagus demonstrate no significant findings. Lungs/Pleura: No significant change in a small subpleural pulmonary nodule of the peripheral posterior right upper lobe measuring 0.7 cm (series 3, image 48). There is a new nodule of the very inferior posterior right upper lobe abutting the major and minor fissures measuring 0.7 cm (series 3, image 76). There are numerous new nodules in the left upper lobe and lingula, for example a 7 mm nodule of the posterior lingula (series 3, image 78) and a 9 mm nodule of the dependent left lower lobe (series 3, image 93). Mild fibrosis of the bilateral lung bases. No pleural effusion or pneumothorax. Upper Abdomen: No acute abnormality. Redemonstrated benign, non FDG avid right adrenal adenoma. Musculoskeletal: No chest wall mass or suspicious bone lesions identified. IMPRESSION: 1. Significant interval decrease in size of enlarged, FDG avid mediastinal lymph nodes, consistent with treatment response. 2. A previously seen small subpleural nodule of the peripheral posterior right upper lobe is unchanged and remains nonspecific. 3. There are numerous new small pulmonary  nodules, the majority present in the  dependent left lower lobe. These are nonspecific although distribution generally suggests an infectious or inflammatory etiology. Metastatic disease is however a substantial differential consideration. 4. Coronary artery disease. Aortic Atherosclerosis (ICD10-I70.0). Electronically Signed   By: Eddie Candle M.D.   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 Multiplanar CT image reconstructions were also generated. COMPARISON:  None. FINDINGS: Alignment: Alignment is grossly anatomic. Skull base and vertebrae: Evaluation of the cervical vertebral bodies is severely limited due to patient motion, especially from the skull base through the C2 level. I do not see any acute or destructive bony lesions. Soft tissues and spinal canal: No prevertebral fluid or swelling. No visible canal hematoma. Disc levels: Prominent facet hypertrophic changes at C2-3 and C3-4, left predominant. Mild lower cervical disc space narrowing. Upper chest: Central airway is patent.  Lung apices are clear. Other: Reconstructed images demonstrate no additional findings. IMPRESSION: 1. Limited evaluation of the upper cervical spine and skull base due to extensive patient motion. 2. No acute displaced fractures. 3. Mild cervical degenerative changes as above. Electronically Signed   By: Randa Ngo M.D.   On: 12/13/2020 21:42   CT T-SPINE NO CHARGE  Result Date: 2021-01-07 CLINICAL DATA:  Initial evaluation for acute trauma, fall. EXAM: CT THORACIC AND LUMBAR SPINE WITHOUT CONTRAST TECHNIQUE: Multidetector CT imaging of the thoracic and lumbar spine was performed without contrast. Multiplanar CT image reconstructions were also generated. COMPARISON:  Comparison made with concomitant CT of the  chest, abdomen, and pelvis. FINDINGS: CT THORACIC SPINE FINDINGS Alignment: Examination mildly degraded by motion artifact. Exaggeration of the normal thoracic kyphosis with underlying trace scoliotic curvature. No significant listhesis. Vertebrae: Vertebral body height maintained without acute or chronic fracture. Visualized ribs intact. No discrete osseous lesions. Paraspinal and other soft tissues: Paraspinous soft tissues demonstrate no acute finding. Scattered pulmonary nodules noted within the partially visualized lungs, better evaluated on concomitant chest CT. Aortic atherosclerosis. Scattered coronary artery calcifications. Hiatal hernia. Disc levels: No visible significant disc bulge or focal disc herniation. No significant spinal stenosis. Mild degenerative endplate spurring noted within the mid and lower thoracic spine. CT LUMBAR SPINE FINDINGS Segmentation: Is standard. Lowest well-formed disc space labeled the L5-S1 level. Alignment: Physiologic with preservation of the normal lumbar lordosis. No listhesis. Vertebrae: Vertebral body height maintained without acute or chronic fracture. Visualized sacrum and pelvis intact. SI joints symmetric and normal. No worrisome osseous lesions. Paraspinal and other soft tissues: Paraspinous soft tissues demonstrate no acute finding. Advanced aorto bi-iliac atherosclerotic disease. Iliac stents in place. Right adrenal adenoma noted. Few scattered benign appearing cyst noted about the visualized kidneys. Disc levels: L1-2: Degenerative intervertebral disc space narrowing with mild disc bulge. Mild reactive endplate spurring. No spinal stenosis. Foramina remain patent. L2-3: Disc bulge with mild reactive endplate spurring. Disc bulge slightly eccentric to the right. Moderate right with mild left facet hypertrophy. No significant spinal stenosis. Mild right worse than left L2 foraminal stenosis. L3-4: Mild disc bulge with minimal reactive endplate spurring. Disc bulge  slightly asymmetric to the right. Mild facet hypertrophy. No significant spinal stenosis. Foramina remain patent. L4-5: Minimal disc bulge. Mild to moderate bilateral facet hypertrophy. No significant spinal stenosis. Mild left L4 foraminal narrowing. Right neural foramina remains patent. L5-S1: Degenerative intervertebral  disc space narrowing with disc desiccation and diffuse disc bulge. Reactive endplate spurring. Mild facet hypertrophy. No significant spinal stenosis. Moderate bilateral L5 foraminal narrowing. IMPRESSION: 1. No acute traumatic injury within the thoracic or lumbar spine. 2. Mild multilevel degenerative spondylosis without significant spinal stenosis moderate bilateral L5 foraminal stenosis. 3. Aortic Atherosclerosis (ICD10-I70.0). Electronically Signed   By: Jeannine Boga M.D.   On: Jan 02, 2021 01:39   CT L-SPINE NO CHARGE  Result Date: 01/02/21 CLINICAL DATA:  Initial evaluation for acute trauma, fall. EXAM: CT THORACIC AND LUMBAR SPINE WITHOUT CONTRAST TECHNIQUE: Multidetector CT imaging of the thoracic and lumbar spine was performed without contrast. Multiplanar CT image reconstructions were also generated. COMPARISON:  Comparison made with concomitant CT of the chest, abdomen, and pelvis. FINDINGS: CT THORACIC SPINE FINDINGS Alignment: Examination mildly degraded by motion artifact. Exaggeration of the normal thoracic kyphosis with underlying trace scoliotic curvature. No significant listhesis. Vertebrae: Vertebral body height maintained without acute or chronic fracture. Visualized ribs intact. No discrete osseous lesions. Paraspinal and other soft tissues: Paraspinous soft tissues demonstrate no acute finding. Scattered pulmonary nodules noted within the partially visualized lungs, better evaluated on concomitant chest CT. Aortic atherosclerosis. Scattered coronary artery calcifications. Hiatal hernia. Disc levels: No visible significant disc bulge or focal disc herniation. No  significant spinal stenosis. Mild degenerative endplate spurring noted within the mid and lower thoracic spine. CT LUMBAR SPINE FINDINGS Segmentation: Is standard. Lowest well-formed disc space labeled the L5-S1 level. Alignment: Physiologic with preservation of the normal lumbar lordosis. No listhesis. Vertebrae: Vertebral body height maintained without acute or chronic fracture. Visualized sacrum and pelvis intact. SI joints symmetric and normal. No worrisome osseous lesions. Paraspinal and other soft tissues: Paraspinous soft tissues demonstrate no acute finding. Advanced aorto bi-iliac atherosclerotic disease. Iliac stents in place. Right adrenal adenoma noted. Few scattered benign appearing cyst noted about the visualized kidneys. Disc levels: L1-2: Degenerative intervertebral disc space narrowing with mild disc bulge. Mild reactive endplate spurring. No spinal stenosis. Foramina remain patent. L2-3: Disc bulge with mild reactive endplate spurring. Disc bulge slightly eccentric to the right. Moderate right with mild left facet hypertrophy. No significant spinal stenosis. Mild right worse than left L2 foraminal stenosis. L3-4: Mild disc bulge with minimal reactive endplate spurring. Disc bulge slightly asymmetric to the right. Mild facet hypertrophy. No significant spinal stenosis. Foramina remain patent. L4-5: Minimal disc bulge. Mild to moderate bilateral facet hypertrophy. No significant spinal stenosis. Mild left L4 foraminal narrowing. Right neural foramina remains patent. L5-S1: Degenerative intervertebral disc space narrowing with disc desiccation and diffuse disc bulge. Reactive endplate spurring. Mild facet hypertrophy. No significant spinal stenosis. Moderate bilateral L5 foraminal narrowing. IMPRESSION: 1. No acute traumatic injury within the thoracic or lumbar spine. 2. Mild multilevel degenerative spondylosis without significant spinal stenosis moderate bilateral L5 foraminal stenosis. 3. Aortic  Atherosclerosis (ICD10-I70.0). Electronically Signed   By: Jeannine Boga M.D.   On: 01/02/21 01:39   DG Chest Portable 1 View  Result Date: 12/11/2020 CLINICAL DATA:  75 year old male with altered mental status. EXAM: PORTABLE CHEST 1 VIEW COMPARISON:  Chest CT dated 12/11/2020. FINDINGS: Right-sided Port-A-Cath with tip at the cavoatrial junction. There is shallow inspiration. Diffuse interstitial prominence and bibasilar reticular scarring. No focal consolidation, pleural effusion, or pneumothorax. Mild cardiomegaly. No acute osseous pathology. IMPRESSION: 1. No acute cardiopulmonary process. 2. Mild cardiomegaly. Electronically Signed   By: Anner Crete M.D.   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 Unenhanced CT was performed per clinician order. Lack of IV contrast limits sensitivity and specificity, especially for evaluation of vascular structures and abdominal/pelvic solid viscera. COMPARISON:  12/11/2020 FINDINGS: CT CHEST FINDINGS Cardiovascular: Unenhanced imaging of the heart and great vessels demonstrates no pericardial effusion. Normal caliber of the thoracic aorta. Extensive atherosclerosis of the aorta and coronary vessels. Mediastinum/Nodes: 8 mm short axis subcarinal lymph node is unchanged. No new lymphadenopathy. Thyroid, trachea, and esophagus are unremarkable. Stable small hiatal hernia. Lungs/Pleura: No acute airspace disease, effusion, or pneumothorax. Stable 7 mm subpleural right upper lobe pulmonary nodule, reference image 48/4. The right upper lobe subpleural nodule at the junction of the major and minor fissures on prior study is not  identified on this exam. 7 mm lingular nodule image 79/4 and and 8 mm left lower lobe pulmonary nodule image 94/4 are unchanged. Stable subpleural scarring and fibrosis at the lung bases. Central airways are patent. Musculoskeletal: No acute or destructive bony lesions. Reconstructed images demonstrate no additional findings. CT ABDOMEN PELVIS FINDINGS Hepatobiliary: Gallbladder is surgically absent. Unenhanced imaging of the liver demonstrates no gross abnormalities. Pancreas: Unremarkable. No pancreatic ductal dilatation or surrounding inflammatory changes. Spleen: Normal in size without focal abnormality. Adrenals/Urinary Tract: Stable 2.7 cm right adrenal nodule previously shown to have imaging characteristics compatible with adenoma. The left adrenal is grossly normal. Bilateral renal cortical cysts are identified. No urinary tract calculi or obstructive uropathy. Asymmetric wall thickening along the left posterolateral aspect of the bladder again seen, measuring 6 mm. Stomach/Bowel: There is chronic wall thickening of the mid sigmoid colon, consistent with scarring from prior inflammation. Diffuse diverticulosis of the distal colon without evidence of acute diverticulitis. No bowel obstruction or ileus. Vascular/Lymphatic: Extensive atherosclerosis of the aorta. Bilateral common iliac artery stents are noted. No pathologic adenopathy. Reproductive: Prostate is enlarged but stable. Other: No free fluid or free gas.  No abdominal wall hernia. Musculoskeletal: No acute or destructive bony lesions. Reconstructed images demonstrate no additional findings. IMPRESSION: 1. Stable bilateral subcentimeter pulmonary nodules. These remain indeterminate, and close attention on follow-up is recommended to exclude metastatic disease. 2. Stable subcentimeter subcarinal lymph node. No pathologic adenopathy. 3. Chronic wall thickening of the left posterolateral aspect of the bladder, unchanged since 2017. If not previously  performed, cystoscopy may be useful. 4. Chronic wall thickening of the mid sigmoid colon, consistent with scarring after previous bouts of inflammation. No evidence of acute inflammation or diverticulitis on today's exam. 5. Stable right adrenal adenoma. 6.  Aortic Atherosclerosis (ICD10-I70.0). Electronically Signed   By: Randa Ngo M.D.   On: 23-Jan-2021 00:13    Microbiology Recent Results (from the past 240 hour(s))  Resp Panel by RT-PCR (Flu A&B, Covid) Nasopharyngeal Swab     Status: None   Collection Time: 12/17/2020  8:57 PM   Specimen: Nasopharyngeal Swab; Nasopharyngeal(NP) swabs in vial transport medium  Result Value Ref Range Status   SARS Coronavirus 2 by RT PCR NEGATIVE NEGATIVE Final    Comment: (NOTE) SARS-CoV-2 target nucleic acids are NOT DETECTED.  The SARS-CoV-2 RNA is generally detectable in upper respiratory specimens during the acute phase of infection. The lowest concentration of SARS-CoV-2 viral copies this assay can detect is 138 copies/mL. A negative result does not preclude SARS-Cov-2 infection and  should not be used as the sole basis for treatment or other patient management decisions. A negative result may occur with  improper specimen collection/handling, submission of specimen other than nasopharyngeal swab, presence of viral mutation(s) within the areas targeted by this assay, and inadequate number of viral copies(<138 copies/mL). A negative result must be combined with clinical observations, patient history, and epidemiological information. The expected result is Negative.  Fact Sheet for Patients:  EntrepreneurPulse.com.au  Fact Sheet for Healthcare Providers:  IncredibleEmployment.be  This test is no t yet approved or cleared by the [place] FDA and  has been authorized for detection and/or diagnosis of SARS-CoV-2 by FDA under an Emergency Use Authorization (EUA). This EUA will remain  in effect (meaning  this test can be used) for the duration of the COVID-19 declaration under Section 564(b)(1) of the Act, 21 U.S.C.section 360bbb-3(b)(1), unless the authorization is terminated  or revoked sooner.       Influenza A by PCR NEGATIVE NEGATIVE Final   Influenza B by PCR NEGATIVE NEGATIVE Final    Comment: (NOTE) The Xpert Xpress SARS-CoV-2/FLU/RSV plus assay is intended as an aid in the diagnosis of influenza from Nasopharyngeal swab specimens and should not be used as a sole basis for treatment. Nasal washings and aspirates are unacceptable for Xpert Xpress SARS-CoV-2/FLU/RSV testing.  Fact Sheet for Patients: EntrepreneurPulse.com.au  Fact Sheet for Healthcare Providers: IncredibleEmployment.be  This test is not yet approved or cleared by the [place] FDA and has been authorized for detection and/or diagnosis of SARS-CoV-2 by FDA under an Emergency Use Authorization (EUA). This EUA will remain in effect (meaning this test can be used) for the duration of the COVID-19 declaration under Section 564(b)(1) of the Act, 21 U.S.C. section 360bbb-3(b)(1), unless the authorization is terminated or revoked.  Performed at [facility], [address], [place], [place] [address]   Urine Culture     Status: None   Collection Time: 12/28/2020 10:45 PM   Specimen: Urine, Random  Result Value Ref Range Status   Specimen Description   Final    URINE, RANDOM Performed at [facility], [address], [place], [place] [address]    Special Requests NONE  Final   Culture   Final    NO GROWTH Performed at [facility], [place] [address], [place], [place] [address]    Report Status 12/31/2020 FINAL  Final  Culture, blood (x 2)     Status: None (Preliminary result)   Collection Time: January 19, 2021  4:32 AM   Specimen: BLOOD  Result Value Ref Range Status   Specimen Description BLOOD RIGHT [facility]  Final   Special Requests   Final    BOTTLES  DRAWN AEROBIC AND ANAEROBIC Blood Culture adequate volume   Culture   Final    NO GROWTH 1 DAY Performed at [facility], [address] [place],  [address]    Report Status PENDING  Incomplete  Culture, blood (x 2)     Status: None (Preliminary result)   Collection Time: 19-Jan-2021  4:32 AM   Specimen: BLOOD  Result Value Ref Range Status   Specimen Description BLOOD LEFTH FA  Final   Special Requests   Final    BOTTLES DRAWN AEROBIC AND ANAEROBIC Blood Culture adequate volume   Culture   Final    NO GROWTH 1 DAY Performed at [facility], [address], [place],  [address]    Report Status PENDING  Incomplete  Lab Basic Metabolic Panel: Recent Labs  Lab 12/26/2020 2057 2021/01/27 0432  NA 138 136  K 3.7 3.7  CL 103 102  CO2 22 22  GLUCOSE 153* 222*  BUN 13 13  CREATININE 1.07 0.99  CALCIUM 10.3 10.2   Liver Function Tests: Recent Labs  Lab 12/04/2020 2057 January 27, 2021 0432  AST 24 23  ALT 21 22  ALKPHOS 49 49  BILITOT 0.8 0.8  PROT 7.2 7.2  ALBUMIN 4.1 4.0   CBC: Recent Labs  Lab 12/14/2020 2057 01-27-21 0432  WBC 4.4 2.6*  NEUTROABS 2.2 2.1  HGB 10.9* 11.0*  HCT 31.5* 30.9*  MCV 96.9 96.0  PLT 98* 97*   Sepsis Labs: Recent Labs  Lab 12/04/2020 2057 27-Jan-2021 0432  PROCALCITON  --  <0.10  WBC 4.4 2.6*  LATICACIDVEN  --  1.4      Benigna Delisi 12/31/2020, 8:48 AM

## 2020-12-31 NOTE — ED Notes (Signed)
Pt's family remains at bedside at this time. Pt given medication due to increased work of breathing and dyspnea at this time.

## 2020-12-31 DEATH — deceased

## 2021-01-04 LAB — CULTURE, BLOOD (ROUTINE X 2)
Culture: NO GROWTH
Culture: NO GROWTH
Special Requests: ADEQUATE
Special Requests: ADEQUATE

## 2021-01-05 ENCOUNTER — Inpatient Hospital Stay: Payer: Medicare PPO | Admitting: Internal Medicine

## 2021-01-05 ENCOUNTER — Inpatient Hospital Stay: Payer: Medicare PPO
